# Patient Record
Sex: Female | Born: 1950 | Race: White | Hispanic: No | Marital: Married | Smoking: Never smoker
Health system: Southern US, Community
[De-identification: ages and names within clinical notes are randomized; demographics above are authoritative.]

## PROBLEM LIST (undated history)

## (undated) DIAGNOSIS — E079 Disorder of thyroid, unspecified: Secondary | ICD-10-CM

## (undated) DIAGNOSIS — N189 Chronic kidney disease, unspecified: Secondary | ICD-10-CM

## (undated) DIAGNOSIS — E119 Type 2 diabetes mellitus without complications: Secondary | ICD-10-CM

## (undated) DIAGNOSIS — M810 Age-related osteoporosis without current pathological fracture: Secondary | ICD-10-CM

## (undated) DIAGNOSIS — M8580 Other specified disorders of bone density and structure, unspecified site: Secondary | ICD-10-CM

## (undated) DIAGNOSIS — I10 Essential (primary) hypertension: Secondary | ICD-10-CM

## (undated) DIAGNOSIS — E039 Hypothyroidism, unspecified: Secondary | ICD-10-CM

## (undated) DIAGNOSIS — E785 Hyperlipidemia, unspecified: Secondary | ICD-10-CM

## (undated) HISTORY — DX: Chronic kidney disease, unspecified: N18.9

## (undated) HISTORY — DX: Essential (primary) hypertension: I10

## (undated) HISTORY — DX: Type 2 diabetes mellitus without complications: E11.9

## (undated) HISTORY — DX: Other specified disorders of bone density and structure, unspecified site: M85.80

## (undated) HISTORY — DX: Disorder of thyroid, unspecified: E07.9

## (undated) HISTORY — DX: Hyperlipidemia, unspecified: E78.5

## (undated) HISTORY — DX: Age-related osteoporosis without current pathological fracture: M81.0

---

## 2006-02-16 ENCOUNTER — Ambulatory Visit: Payer: Self-pay | Admitting: Gastroenterology

## 2006-02-16 LAB — HM COLONOSCOPY

## 2009-10-11 ENCOUNTER — Ambulatory Visit: Payer: Self-pay | Admitting: Internal Medicine

## 2009-10-22 ENCOUNTER — Ambulatory Visit: Payer: Self-pay | Admitting: Internal Medicine

## 2009-11-11 ENCOUNTER — Ambulatory Visit: Payer: Self-pay | Admitting: Internal Medicine

## 2009-12-11 ENCOUNTER — Ambulatory Visit: Payer: Self-pay | Admitting: Internal Medicine

## 2010-01-11 ENCOUNTER — Ambulatory Visit: Payer: Self-pay | Admitting: Internal Medicine

## 2010-02-11 ENCOUNTER — Ambulatory Visit: Payer: Self-pay | Admitting: Internal Medicine

## 2010-02-24 ENCOUNTER — Ambulatory Visit: Payer: Self-pay | Admitting: Internal Medicine

## 2010-03-12 ENCOUNTER — Ambulatory Visit: Payer: Self-pay | Admitting: Internal Medicine

## 2010-06-23 ENCOUNTER — Ambulatory Visit: Payer: Self-pay | Admitting: Internal Medicine

## 2010-07-12 ENCOUNTER — Ambulatory Visit: Payer: Self-pay | Admitting: Internal Medicine

## 2010-10-20 ENCOUNTER — Ambulatory Visit: Payer: Self-pay | Admitting: Internal Medicine

## 2010-11-12 ENCOUNTER — Ambulatory Visit: Payer: Self-pay | Admitting: Internal Medicine

## 2011-02-23 ENCOUNTER — Ambulatory Visit: Payer: Self-pay | Admitting: Internal Medicine

## 2011-02-23 LAB — CBC CANCER CENTER
Basophil %: 0.5 %
Eosinophil #: 0.1 x10 3/mm (ref 0.0–0.7)
Eosinophil %: 1.7 %
HCT: 35.2 % (ref 35.0–47.0)
HGB: 12.2 g/dL (ref 12.0–16.0)
Lymphocyte #: 1.5 x10 3/mm (ref 1.0–3.6)
MCH: 34.6 pg — ABNORMAL HIGH (ref 26.0–34.0)
MCHC: 34.6 g/dL (ref 32.0–36.0)
MCV: 100 fL (ref 80–100)
Monocyte #: 0.5 x10 3/mm (ref 0.0–0.7)
Neutrophil #: 3 x10 3/mm (ref 1.4–6.5)
Neutrophil %: 59.2 %
RBC: 3.52 10*6/uL — ABNORMAL LOW (ref 3.80–5.20)
WBC: 5.2 x10 3/mm (ref 3.6–11.0)

## 2011-02-23 LAB — IRON AND TIBC
Iron Saturation: 17 %
Iron: 80 ug/dL (ref 50–170)

## 2011-02-23 LAB — CREATININE, SERUM
Creatinine: 0.88 mg/dL (ref 0.60–1.30)
EGFR (African American): >60
EGFR (Non-African Amer.): >60

## 2011-02-23 LAB — RETICULOCYTES
Absolute Retic Count: 0.048 10*6/uL (ref 0.024–0.084)
Reticulocyte: 1.9 % — ABNORMAL HIGH (ref 0.5–1.5)

## 2011-03-12 ENCOUNTER — Ambulatory Visit: Payer: Self-pay | Admitting: Internal Medicine

## 2011-05-04 ENCOUNTER — Ambulatory Visit: Payer: Self-pay | Admitting: Rheumatology

## 2011-11-12 ENCOUNTER — Ambulatory Visit: Payer: Self-pay

## 2012-10-17 LAB — HM PAP SMEAR

## 2012-11-21 ENCOUNTER — Ambulatory Visit: Payer: Self-pay

## 2013-11-15 ENCOUNTER — Ambulatory Visit: Payer: Self-pay

## 2013-11-22 ENCOUNTER — Ambulatory Visit: Payer: Self-pay

## 2013-11-22 LAB — HM MAMMOGRAPHY

## 2014-10-30 ENCOUNTER — Encounter: Payer: Self-pay | Admitting: Unknown Physician Specialty

## 2014-11-15 DIAGNOSIS — M8580 Other specified disorders of bone density and structure, unspecified site: Secondary | ICD-10-CM

## 2014-11-15 DIAGNOSIS — I10 Essential (primary) hypertension: Secondary | ICD-10-CM

## 2014-11-15 DIAGNOSIS — E1159 Type 2 diabetes mellitus with other circulatory complications: Secondary | ICD-10-CM | POA: Insufficient documentation

## 2014-11-15 DIAGNOSIS — N181 Chronic kidney disease, stage 1: Secondary | ICD-10-CM | POA: Insufficient documentation

## 2014-11-15 DIAGNOSIS — M810 Age-related osteoporosis without current pathological fracture: Secondary | ICD-10-CM | POA: Insufficient documentation

## 2014-11-15 DIAGNOSIS — E1129 Type 2 diabetes mellitus with other diabetic kidney complication: Secondary | ICD-10-CM | POA: Insufficient documentation

## 2014-11-15 DIAGNOSIS — E039 Hypothyroidism, unspecified: Secondary | ICD-10-CM

## 2014-11-15 DIAGNOSIS — I129 Hypertensive chronic kidney disease with stage 1 through stage 4 chronic kidney disease, or unspecified chronic kidney disease: Secondary | ICD-10-CM

## 2014-11-15 DIAGNOSIS — E785 Hyperlipidemia, unspecified: Secondary | ICD-10-CM

## 2014-11-15 DIAGNOSIS — E119 Type 2 diabetes mellitus without complications: Secondary | ICD-10-CM | POA: Insufficient documentation

## 2014-11-15 DIAGNOSIS — E1122 Type 2 diabetes mellitus with diabetic chronic kidney disease: Secondary | ICD-10-CM

## 2014-11-18 ENCOUNTER — Ambulatory Visit (INDEPENDENT_AMBULATORY_CARE_PROVIDER_SITE_OTHER): Payer: BLUE CROSS/BLUE SHIELD | Admitting: Unknown Physician Specialty

## 2014-11-18 ENCOUNTER — Encounter: Payer: Self-pay | Admitting: Unknown Physician Specialty

## 2014-11-18 VITALS — BP 126/84 | HR 70 | Temp 98.5°F | Ht 62.0 in | Wt 162.8 lb

## 2014-11-18 DIAGNOSIS — E785 Hyperlipidemia, unspecified: Secondary | ICD-10-CM

## 2014-11-18 DIAGNOSIS — E039 Hypothyroidism, unspecified: Secondary | ICD-10-CM | POA: Diagnosis not present

## 2014-11-18 DIAGNOSIS — N181 Chronic kidney disease, stage 1: Secondary | ICD-10-CM | POA: Diagnosis not present

## 2014-11-18 DIAGNOSIS — E1122 Type 2 diabetes mellitus with diabetic chronic kidney disease: Secondary | ICD-10-CM | POA: Diagnosis not present

## 2014-11-18 DIAGNOSIS — Z23 Encounter for immunization: Secondary | ICD-10-CM

## 2014-11-18 DIAGNOSIS — Z862 Personal history of diseases of the blood and blood-forming organs and certain disorders involving the immune mechanism: Secondary | ICD-10-CM

## 2014-11-18 DIAGNOSIS — I10 Essential (primary) hypertension: Secondary | ICD-10-CM | POA: Diagnosis not present

## 2014-11-18 DIAGNOSIS — Z0000 Encounter for general adult medical examination without abnormal findings: Secondary | ICD-10-CM

## 2014-11-18 LAB — LIPID PANEL PICCOLO, WAIVED
Chol/HDL Ratio Piccolo,Waive: 2.9 mg/dL
Cholesterol Piccolo, Waived: 146 mg/dL (ref <200)
HDL Chol Piccolo, Waived: 51 mg/dL — ABNORMAL LOW (ref >59)
LDL Chol Calc Piccolo Waived: 71 mg/dL (ref <100)
TRIGLYCERIDES PICCOLO,WAIVED: 123 mg/dL (ref <150)
VLDL Chol Calc Piccolo,Waive: 25 mg/dL (ref <30)

## 2014-11-18 LAB — BAYER DCA HB A1C WAIVED: HB A1C: 6.7 % (ref <7.0)

## 2014-11-18 MED ORDER — QUINAPRIL HCL 10 MG PO TABS: 10.0000 mg | ORAL_TABLET | Freq: Every day | ORAL | Status: DC

## 2014-11-18 MED ORDER — INSULIN PEN NEEDLE 32G X 6 MM MISC: 1.0000 [IU] | Freq: Every day | Status: DC

## 2014-11-18 MED ORDER — PIOGLITAZONE HCL 30 MG PO TABS: 30.0000 mg | ORAL_TABLET | Freq: Every day | ORAL | Status: DC

## 2014-11-18 MED ORDER — METFORMIN HCL ER 500 MG PO TB24: 2000.0000 mg | ORAL_TABLET | Freq: Every day | ORAL | Status: DC

## 2014-11-18 MED ORDER — PRAVASTATIN SODIUM 40 MG PO TABS: 40.0000 mg | ORAL_TABLET | Freq: Every day | ORAL | Status: DC

## 2014-11-18 MED ORDER — LEVOTHYROXINE SODIUM 75 MCG PO TABS: 75.0000 ug | ORAL_TABLET | Freq: Every day | ORAL | Status: DC

## 2014-11-18 MED ORDER — LIRAGLUTIDE 18 MG/3ML ~~LOC~~ SOPN: 1.8000 mg | PEN_INJECTOR | Freq: Every day | SUBCUTANEOUS | Status: DC

## 2014-11-18 MED ORDER — GLYBURIDE 5 MG PO TABS: 5.0000 mg | ORAL_TABLET | Freq: Two times a day (BID) | ORAL | Status: DC

## 2014-11-18 MED ORDER — RALOXIFENE HCL 60 MG PO TABS: 60.0000 mg | ORAL_TABLET | Freq: Every day | ORAL | Status: DC

## 2014-11-18 NOTE — Assessment & Plan Note (Signed)
Stable, continue present medications.   

## 2014-11-18 NOTE — Progress Notes (Signed)
BP 126/84 mmHg  Pulse 70  Temp(Src) 98.5 F (36.9 C)  Ht 5\' 2"  (1.575 m)  Wt 162 lb 12.8 oz (73.846 kg)  BMI 29.77 kg/m2  SpO2 100%  LMP  (LMP Unknown)   Subjective:    Patient ID: Emma Berry, female    DOB: Dec 10, 1950, 64 y.o.   MRN: [id number]  HPI: Emma PeakSandra Faye Anacker is a 64 y.o. female  Chief Complaint  Patient presents with  . Annual Exam   Relevant past medical, surgical, family and social history reviewed and updated as indicated. Interim medical history since our last visit reviewed. Allergies and medications reviewed and updated.  See functional status, depression screen, and fall's risk assessment  under the appropriate section.    Pt is able to perform complex mental tasks, recognize clock face, recognize time and do a 3 item recall.      Depression screen PHQ 2/9 11/18/2014  Decreased Interest 0  Down, Depressed, Hopeless 0  PHQ - 2 Score 0    Fall Risk  11/18/2014  Falls in the past year? No    Diabetes:  Using medications without difficulties Hypoglycemic episodes:no Hyperglycemic episodes: no Feet problems:none Blood Sugars averaging: 100s in the AM eye exam within last year: Yes, letter sent to opthamology  Hypertension Using medications without difficulty Average home BPs 120/70's  Using medication without problems or lightheadedness No chest pain with exertion or shortness of breath Edema: none  Elevated Cholesterol: Using medications without problems Muscle aches: right thigh Diet compliance: good Exercise:good     Relevant past medical, surgical, family and social history reviewed and updated as indicated. Interim medical history since our last visit reviewed. Allergies and medications reviewed and updated.  Review of Systems  Constitutional: Negative.   HENT: Negative.   Eyes: Negative.   Respiratory: Negative.   Cardiovascular: Negative.   Gastrointestinal: Negative.   Endocrine: Negative.   Genitourinary: Negative.    Musculoskeletal: Negative.   Skin: Negative.   Allergic/Immunologic: Negative.   Neurological: Negative.   Hematological: Negative.   Psychiatric/Behavioral: Negative.     Per HPI unless specifically indicated above     Objective:    BP 126/84 mmHg  Pulse 70  Temp(Src) 98.5 F (36.9 C)  Ht 5\' 2"  (1.575 m)  Wt 162 lb 12.8 oz (73.846 kg)  BMI 29.77 kg/m2  SpO2 100%  LMP  (LMP Unknown)  Wt Readings from Last 3 Encounters:  11/18/14 162 lb 12.8 oz (73.846 kg)  04/30/14 164 lb (74.39 kg)    Physical Exam  Constitutional: She is oriented to person, place, and time. She appears well-developed and well-nourished.  HENT:  Head: Normocephalic and atraumatic.  Eyes: Pupils are equal, round, and reactive to light. Right eye exhibits no discharge. Left eye exhibits no discharge. No scleral icterus.  Neck: Normal range of motion. Neck supple. Carotid bruit is not present. No thyromegaly present.  Cardiovascular: Normal rate, regular rhythm and normal heart sounds.  Exam reveals no gallop and no friction rub.   No murmur heard. Pulmonary/Chest: Effort normal and breath sounds normal. No respiratory distress. She has no wheezes. She has no rales.  Abdominal: Soft. Bowel sounds are normal. There is no tenderness. There is no rebound.  Genitourinary: No breast swelling, tenderness or discharge.  Musculoskeletal: Normal range of motion.  Lymphadenopathy:    She has no cervical adenopathy.  Neurological: She is alert and oriented to person, place, and time.  Skin: Skin is warm, dry and intact. No  rash noted.  Psychiatric: She has a normal mood and affect. Her speech is normal and behavior is normal. Judgment and thought content normal. Cognition and memory are normal.      Assessment & Plan:   Problem List Items Addressed This Visit      Unprioritized   Diabetes (HCC) - Primary    Hgb A1C is 6.7.  Continue present medications.        Relevant Medications   glyBURIDE (DIABETA) 5  MG tablet   Liraglutide (VICTOZA) 18 MG/3ML SOPN   metFORMIN (GLUCOPHAGE-XR) 500 MG 24 hr tablet   pioglitazone (ACTOS) 30 MG tablet   pravastatin (PRAVACHOL) 40 MG tablet   quinapril (ACCUPRIL) 10 MG tablet   Other Relevant Orders   Bayer DCA Hb A1c Waived   Hypertension    Stable, continue present medications.       Relevant Medications   pravastatin (PRAVACHOL) 40 MG tablet   quinapril (ACCUPRIL) 10 MG tablet   Other Relevant Orders   Bayer DCA Hb A1c Waived   Comprehensive metabolic panel   Hyperlipidemia    Reviewed lipid panel.  LDL was less than 100.  Continue present medications.         Relevant Medications   pravastatin (PRAVACHOL) 40 MG tablet   quinapril (ACCUPRIL) 10 MG tablet   Other Relevant Orders   Lipid Panel Piccolo, Waived   Hypothyroidism   Relevant Medications   levothyroxine (SYNTHROID, LEVOTHROID) 75 MCG tablet   Other Relevant Orders   TSH   CKD (chronic kidney disease), stage I    Other Visit Diagnoses    Routine general medical examination at a health care facility        Relevant Orders    Hepatitis C antibody    DG Bone Density    History of anemia        Relevant Orders    CBC with Differential/Platelet    Immunization due        Relevant Orders    Flu Vaccine QUAD 36+ mos IM (Completed)    Pneumococcal conjugate vaccine 13-valent IM (Completed)    Tdap vaccine greater than or equal to 7yo IM (Completed)        Follow up plan: Return in about 6 months (around 05/18/2015).-

## 2014-11-18 NOTE — Assessment & Plan Note (Signed)
Reviewed lipid panel.  LDL was less than 100.  Continue present medications.

## 2014-11-18 NOTE — Assessment & Plan Note (Signed)
Hgb A1C is 6.7.  Continue present medications 

## 2014-11-19 LAB — COMPREHENSIVE METABOLIC PANEL
A/G RATIO: 1.4 (ref 1.1–2.5)
ALBUMIN: 4.2 g/dL (ref 3.6–4.8)
ALK PHOS: 75 IU/L (ref 39–117)
ALT: 16 IU/L (ref 0–32)
AST: 37 IU/L (ref 0–40)
BUN / CREAT RATIO: 16 (ref 11–26)
BUN: 15 mg/dL (ref 8–27)
Bilirubin Total: 0.3 mg/dL (ref 0.0–1.2)
CO2: 23 mmol/L (ref 18–29)
CREATININE: 0.93 mg/dL (ref 0.57–1.00)
Calcium: 9.7 mg/dL (ref 8.7–10.3)
Chloride: 100 mmol/L (ref 97–106)
GFR calc Af Amer: 74 mL/min/{1.73_m2} (ref >59)
GFR, EST NON AFRICAN AMERICAN: 64 mL/min/{1.73_m2} (ref >59)
GLOBULIN, TOTAL: 3 g/dL (ref 1.5–4.5)
Glucose: 87 mg/dL (ref 65–99)
POTASSIUM: 5.8 mmol/L — AB (ref 3.5–5.2)
SODIUM: 140 mmol/L (ref 136–144)
Total Protein: 7.2 g/dL (ref 6.0–8.5)

## 2014-11-19 LAB — TSH: TSH: 1.76 u[IU]/mL (ref 0.450–4.500)

## 2014-11-19 LAB — CBC WITH DIFFERENTIAL/PLATELET
BASOS: 0 %
Basophils Absolute: 0 10*3/uL (ref 0.0–0.2)
EOS (ABSOLUTE): 0.1 10*3/uL (ref 0.0–0.4)
EOS: 1 %
HEMATOCRIT: 38.9 % (ref 34.0–46.6)
HEMOGLOBIN: 13 g/dL (ref 11.1–15.9)
Immature Grans (Abs): 0 10*3/uL (ref 0.0–0.1)
Immature Granulocytes: 1 %
LYMPHS ABS: 1.3 10*3/uL (ref 0.7–3.1)
Lymphs: 23 %
MCH: 33.1 pg — ABNORMAL HIGH (ref 26.6–33.0)
MCHC: 33.4 g/dL (ref 31.5–35.7)
MCV: 99 fL — AB (ref 79–97)
MONOS ABS: 0.5 10*3/uL (ref 0.1–0.9)
Monocytes: 9 %
NEUTROS ABS: 3.8 10*3/uL (ref 1.4–7.0)
Neutrophils: 66 %
Platelets: 283 10*3/uL (ref 150–379)
RBC: 3.93 x10E6/uL (ref 3.77–5.28)
RDW: 13.5 % (ref 12.3–15.4)
WBC: 5.8 10*3/uL (ref 3.4–10.8)

## 2014-11-19 LAB — HEPATITIS C ANTIBODY: Hep C Virus Ab: <0.1 s/co ratio (ref 0.0–0.9)

## 2015-02-03 ENCOUNTER — Other Ambulatory Visit: Payer: Self-pay | Admitting: Unknown Physician Specialty

## 2015-02-21 ENCOUNTER — Other Ambulatory Visit: Payer: Self-pay | Admitting: *Deleted

## 2015-03-24 ENCOUNTER — Other Ambulatory Visit: Payer: Self-pay | Admitting: Unknown Physician Specialty

## 2015-03-31 LAB — HM DIABETES EYE EXAM

## 2015-04-29 ENCOUNTER — Other Ambulatory Visit: Payer: Self-pay | Admitting: Family Medicine

## 2015-04-29 NOTE — Telephone Encounter (Signed)
Your patient 

## 2015-05-19 ENCOUNTER — Encounter: Payer: Self-pay | Admitting: Unknown Physician Specialty

## 2015-05-19 ENCOUNTER — Ambulatory Visit (INDEPENDENT_AMBULATORY_CARE_PROVIDER_SITE_OTHER): Payer: BLUE CROSS/BLUE SHIELD | Admitting: Unknown Physician Specialty

## 2015-05-19 VITALS — BP 131/75 | HR 64 | Temp 98.4°F | Ht 61.6 in | Wt 164.4 lb

## 2015-05-19 DIAGNOSIS — E1122 Type 2 diabetes mellitus with diabetic chronic kidney disease: Secondary | ICD-10-CM

## 2015-05-19 DIAGNOSIS — E1143 Type 2 diabetes mellitus with diabetic autonomic (poly)neuropathy: Secondary | ICD-10-CM | POA: Diagnosis not present

## 2015-05-19 DIAGNOSIS — E785 Hyperlipidemia, unspecified: Secondary | ICD-10-CM | POA: Diagnosis not present

## 2015-05-19 DIAGNOSIS — I10 Essential (primary) hypertension: Secondary | ICD-10-CM | POA: Diagnosis not present

## 2015-05-19 DIAGNOSIS — N181 Chronic kidney disease, stage 1: Secondary | ICD-10-CM | POA: Diagnosis not present

## 2015-05-19 LAB — MICROALBUMIN, URINE WAIVED
CREATININE, URINE WAIVED: 50 mg/dL (ref 10–300)
Microalb, Ur Waived: 10 mg/L (ref 0–19)

## 2015-05-19 LAB — LIPID PANEL PICCOLO, WAIVED
Chol/HDL Ratio Piccolo,Waive: 2.6 mg/dL
Cholesterol Piccolo, Waived: 129 mg/dL (ref <200)
HDL CHOL PICCOLO, WAIVED: 50 mg/dL — AB (ref >59)
LDL Chol Calc Piccolo Waived: 64 mg/dL (ref <100)
Triglycerides Piccolo,Waived: 74 mg/dL (ref <150)
VLDL Chol Calc Piccolo,Waive: 15 mg/dL (ref <30)

## 2015-05-19 LAB — BAYER DCA HB A1C WAIVED: HB A1C (BAYER DCA - WAIVED): 7 % — ABNORMAL HIGH (ref <7.0)

## 2015-05-19 MED ORDER — GLYBURIDE 5 MG PO TABS: 5.0000 mg | ORAL_TABLET | Freq: Two times a day (BID) | ORAL | Status: DC

## 2015-05-19 MED ORDER — GABAPENTIN 300 MG PO CAPS: ORAL_CAPSULE | ORAL | Status: DC

## 2015-05-19 MED ORDER — PIOGLITAZONE HCL 30 MG PO TABS: 30.0000 mg | ORAL_TABLET | Freq: Every day | ORAL | Status: DC

## 2015-05-19 MED ORDER — METFORMIN HCL ER 500 MG PO TB24: 2000.0000 mg | ORAL_TABLET | Freq: Every day | ORAL | Status: DC

## 2015-05-19 MED ORDER — QUINAPRIL HCL 10 MG PO TABS: 10.0000 mg | ORAL_TABLET | Freq: Every day | ORAL | Status: DC

## 2015-05-19 MED ORDER — LIRAGLUTIDE 18 MG/3ML ~~LOC~~ SOPN: 1.8000 mg | PEN_INJECTOR | Freq: Every morning | SUBCUTANEOUS | Status: DC

## 2015-05-19 MED ORDER — PRAVASTATIN SODIUM 40 MG PO TABS: 40.0000 mg | ORAL_TABLET | Freq: Every day | ORAL | Status: DC

## 2015-05-19 NOTE — Assessment & Plan Note (Addendum)
Hgb A1C is 7.0.  She plans to increase exercise.  Microalbumin is 10 today

## 2015-05-19 NOTE — Progress Notes (Signed)
+,,,,,,,,,,,,,,,,,,,,,,,,+   BP 131/75 mmHg  Pulse 64  Temp(Src) 98.4 F (36.9 C)  Ht 5' 1.6" (1.565 m)  Wt 164 lb 6.4 oz (74.571 kg)  BMI 30.45 kg/m2  SpO2 98%  LMP  (LMP Unknown)   Subjective:    Patient ID: Emma Berry, female    DOB: [birth year], 65 y.o.   MRN: [id number]  HPI: Emma Berry is a 65 y.o. female  Chief Complaint  Patient presents with  . Hypertension  . Hyperlipidemia  . Diabetes  . Medication Refill    pt states she needs refill on gabapentin   Diabetes:  Using medications without difficulties No hypoglycemic episodes No hyperglycemic episodes Feet problems: Takes Gabapentin and needs a refill   Hypertension:  Using medications without difficulty  Using medication without problems or lightheadedness No chest pain with exertion or shortness of breath No Edema  Elevated Cholesterol: Using medications without problems No Muscle aches Diet compliance: Excellent diet Exercise: walks regularly   Relevant past medical, surgical, family and social history reviewed and updated as indicated. Interim medical history since our last visit reviewed. Allergies and medications reviewed and updated.  Review of Systems  Per HPI unless specifically indicated above     Objective:    BP 131/75 mmHg  Pulse 64  Temp(Src) 98.4 F (36.9 C)  Ht 5' 1.6" (1.565 m)  Wt 164 lb 6.4 oz (74.571 kg)  BMI 30.45 kg/m2  SpO2 98%  LMP  (LMP Unknown)  Wt Readings from Last 3 Encounters:  05/19/15 164 lb 6.4 oz (74.571 kg)  11/18/14 162 lb 12.8 oz (73.846 kg)  04/30/14 164 lb (74.39 kg)    Physical Exam  Constitutional: She is oriented to person, place, and time. She appears well-developed and well-nourished. No distress.  HENT:  Head: Normocephalic and atraumatic.  Eyes: Conjunctivae and lids are normal. Right eye exhibits no discharge. Left eye exhibits no discharge. No scleral icterus.  Neck: Normal range of motion. Neck supple. No JVD present.  Carotid bruit is not present.  Cardiovascular: Normal rate, regular rhythm and normal heart sounds.   Pulmonary/Chest: Effort normal and breath sounds normal.  Abdominal: Normal appearance. There is no splenomegaly or hepatomegaly.  Musculoskeletal: Normal range of motion.  Neurological: She is alert and oriented to person, place, and time.  Skin: Skin is warm, dry and intact. No rash noted. No pallor.  Psychiatric: She has a normal mood and affect. Her behavior is normal. Judgment and thought content normal.   Diabetic Foot Exam - Simple   Simple Foot Form  Diabetic Foot exam was performed with the following findings:  Yes 05/19/2015  9:40 AM  Visual Inspection  No deformities, no ulcerations, no other skin breakdown bilaterally:  Yes  Sensation Testing  Intact to touch and monofilament testing bilaterally:  Yes  Pulse Check  Posterior Tibialis and Dorsalis pulse intact bilaterally:  Yes  Comments        Assessment & Plan:   Problem List Items Addressed This Visit      Unprioritized   Diabetes (HCC) - Primary    Hgb A1C is 7.0.  She plans to increase exercise.  Microalbumin is 10 today      Relevant Medications   Liraglutide (VICTOZA) 18 MG/3ML SOPN   pravastatin (PRAVACHOL) 40 MG tablet   pioglitazone (ACTOS) 30 MG tablet   metFORMIN (GLUCOPHAGE-XR) 500 MG 24 hr tablet   glyBURIDE (DIABETA) 5 MG tablet   quinapril (ACCUPRIL) 10 MG tablet  Other Relevant Orders   Comprehensive metabolic panel   Bayer DCA Hb Z6XA1c Waived   Hyperlipidemia   Relevant Medications   pravastatin (PRAVACHOL) 40 MG tablet   quinapril (ACCUPRIL) 10 MG tablet   Other Relevant Orders   Lipid Panel Piccolo, Waived   Hypertension    Stable, continue present medications.        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

## 2015-05-19 NOTE — Assessment & Plan Note (Signed)
Stable, continue present medications.   

## 2015-05-20 LAB — COMPREHENSIVE METABOLIC PANEL
ALT: 13 IU/L (ref 0–32)
AST: 18 IU/L (ref 0–40)
Albumin/Globulin Ratio: 1.5 (ref 1.2–2.2)
Albumin: 4.1 g/dL (ref 3.6–4.8)
Alkaline Phosphatase: 68 IU/L (ref 39–117)
BUN/Creatinine Ratio: 21 (ref 12–28)
BUN: 17 mg/dL (ref 8–27)
Bilirubin Total: 0.3 mg/dL (ref 0.0–1.2)
CALCIUM: 9.7 mg/dL (ref 8.7–10.3)
CO2: 25 mmol/L (ref 18–29)
CREATININE: 0.81 mg/dL (ref 0.57–1.00)
Chloride: 101 mmol/L (ref 96–106)
GFR calc Af Amer: 87 mL/min/{1.73_m2} (ref >59)
GFR, EST NON AFRICAN AMERICAN: 75 mL/min/{1.73_m2} (ref >59)
Globulin, Total: 2.7 g/dL (ref 1.5–4.5)
Glucose: 120 mg/dL — ABNORMAL HIGH (ref 65–99)
Potassium: 4.6 mmol/L (ref 3.5–5.2)
Sodium: 141 mmol/L (ref 134–144)
Total Protein: 6.8 g/dL (ref 6.0–8.5)

## 2015-05-20 LAB — LIPID PANEL PICCOLO, WAIVED

## 2015-06-25 ENCOUNTER — Ambulatory Visit
Admission: RE | Admit: 2015-06-25 | Discharge: 2015-06-25 | Disposition: A | Discharge status: Home / Self Care | Payer: BLUE CROSS/BLUE SHIELD | Source: Ambulatory Visit | Attending: Unknown Physician Specialty | Admitting: Unknown Physician Specialty

## 2015-06-25 DIAGNOSIS — M85832 Other specified disorders of bone density and structure, left forearm: Secondary | ICD-10-CM | POA: Insufficient documentation

## 2015-06-25 DIAGNOSIS — Z13820 Encounter for screening for osteoporosis: Secondary | ICD-10-CM | POA: Insufficient documentation

## 2015-06-25 DIAGNOSIS — M85851 Other specified disorders of bone density and structure, right thigh: Secondary | ICD-10-CM | POA: Insufficient documentation

## 2015-06-25 DIAGNOSIS — Z0000 Encounter for general adult medical examination without abnormal findings: Secondary | ICD-10-CM

## 2015-08-25 ENCOUNTER — Encounter: Payer: Self-pay | Admitting: Unknown Physician Specialty

## 2015-08-25 ENCOUNTER — Ambulatory Visit (INDEPENDENT_AMBULATORY_CARE_PROVIDER_SITE_OTHER): Payer: BLUE CROSS/BLUE SHIELD | Admitting: Unknown Physician Specialty

## 2015-08-25 VITALS — BP 122/72 | HR 63 | Temp 97.9°F | Ht 62.5 in | Wt 163.8 lb

## 2015-08-25 DIAGNOSIS — I10 Essential (primary) hypertension: Secondary | ICD-10-CM

## 2015-08-25 DIAGNOSIS — N181 Chronic kidney disease, stage 1: Secondary | ICD-10-CM | POA: Diagnosis not present

## 2015-08-25 DIAGNOSIS — H6091 Unspecified otitis externa, right ear: Secondary | ICD-10-CM

## 2015-08-25 DIAGNOSIS — E1122 Type 2 diabetes mellitus with diabetic chronic kidney disease: Secondary | ICD-10-CM

## 2015-08-25 DIAGNOSIS — E785 Hyperlipidemia, unspecified: Secondary | ICD-10-CM | POA: Diagnosis not present

## 2015-08-25 LAB — BAYER DCA HB A1C WAIVED: HB A1C: 7.2 % — AB (ref <7.0)

## 2015-08-25 LAB — HEMOGLOBIN A1C: HEMOGLOBIN A1C: 7.2

## 2015-08-25 MED ORDER — NEOMYCIN-POLYMYXIN-HC 3.5-10000-1 OT SOLN: 4.0000 [drp] | Freq: Four times a day (QID) | OTIC | 0 refills | Status: DC

## 2015-08-25 NOTE — Progress Notes (Signed)
BP 122/72 (BP Location: Left Arm, Patient Position: Sitting, Cuff Size: Large)   Pulse 63   Temp 97.9 F (36.6 C)   Ht 5' 2.5" (1.588 m)   Wt 163 lb 12.8 oz (74.3 kg)   LMP  (LMP Unknown)   SpO2 99%   BMI 29.48 kg/m    Subjective:    Patient ID: Emma Berry, female    DOB: [birth year], 65 y.o.   MRN: [id number]  HPI: Emma Berry is a 65 y.o. female  Chief Complaint  Patient presents with  . Diabetes  . Hyperlipidemia  . Hypothyroidism  . Ear Drainage    pt states she wants right ear looked at, states it has been itching and draining for about 3 weeks    Diabetes: Using medications without difficulties No hypoglycemic episodes No hyperglycemic episodes Feet problems: Blood Sugars averaging: eye exam within last year Last Hgb A1C:7.0  Hypertension  Using medications without difficulty Average home BPs   Using medication without problems or lightheadedness No chest pain with exertion or shortness of breath No Edema  Elevated Cholesterol Using medications without problems No Muscle aches  Diet:  Exercise:  Rt ear Drainage and itching for 3 weeks.     Relevant past medical, surgical, family and social history reviewed and updated as indicated. Interim medical history since our last visit reviewed. Allergies and medications reviewed and updated.  Review of Systems  Per HPI unless specifically indicated above     Objective:    BP 122/72 (BP Location: Left Arm, Patient Position: Sitting, Cuff Size: Large)   Pulse 63   Temp 97.9 F (36.6 C)   Ht 5' 2.5" (1.588 m)   Wt 163 lb 12.8 oz (74.3 kg)   LMP  (LMP Unknown)   SpO2 99%   BMI 29.48 kg/m   Wt Readings from Last 3 Encounters:  08/25/15 163 lb 12.8 oz (74.3 kg)  05/19/15 164 lb 6.4 oz (74.6 kg)  11/18/14 162 lb 12.8 oz (73.8 kg)    Physical Exam  Constitutional: She is oriented to person, place, and time. She appears well-developed and well-nourished. No distress.  HENT:  Head:  Normocephalic and atraumatic.  Right Ear: There is drainage, swelling and tenderness.  Eyes: Conjunctivae and lids are normal. Right eye exhibits no discharge. Left eye exhibits no discharge. No scleral icterus.  Neck: Normal range of motion. Neck supple. No JVD present. Carotid bruit is not present.  Cardiovascular: Normal rate, regular rhythm and normal heart sounds.   Pulmonary/Chest: Effort normal and breath sounds normal.  Abdominal: Normal appearance. There is no splenomegaly or hepatomegaly.  Musculoskeletal: Normal range of motion.  Neurological: She is alert and oriented to person, place, and time.  Skin: Skin is warm, dry and intact. No rash noted. No pallor.  Psychiatric: She has a normal mood and affect. Her behavior is normal. Judgment and thought content normal.    Results for orders placed or performed in visit on 05/19/15  Microalbumin, Urine Waived  Result Value Ref Range   Microalb, Ur Waived 10 0 - 19 mg/L   Creatinine, Urine Waived 50 10 - 300 mg/dL   Microalb/Creat Ratio 30-300 (H) <30 mg/g  Comprehensive metabolic panel  Result Value Ref Range   Glucose 120 (H) 65 - 99 mg/dL   BUN 17 8 - 27 mg/dL   Creatinine, Ser 4.090.81 0.57 - 1.00 mg/dL   GFR calc non Af Amer 75 >59 mL/min/1.73   GFR calc Af Denyse DagoAmer  87 >59 mL/min/1.73   BUN/Creatinine Ratio 21 12 - 28   Sodium 141 134 - 144 mmol/L   Potassium 4.6 3.5 - 5.2 mmol/L   Chloride 101 96 - 106 mmol/L   CO2 25 18 - 29 mmol/L   Calcium 9.7 8.7 - 10.3 mg/dL   Total Protein 6.8 6.0 - 8.5 g/dL   Albumin 4.1 3.6 - 4.8 g/dL   Globulin, Total 2.7 1.5 - 4.5 g/dL   Albumin/Globulin Ratio 1.5 1.2 - 2.2   Bilirubin Total 0.3 0.0 - 1.2 mg/dL   Alkaline Phosphatase 68 39 - 117 IU/L   AST 18 0 - 40 IU/L   ALT 13 0 - 32 IU/L  Bayer DCA Hb A1c Waived  Result Value Ref Range   Bayer DCA Hb A1c Waived 7.0 (H) <7.0 %  Lipid Panel Piccolo, Waived  Result Value Ref Range   Cholesterol Piccolo, Waived CANCELED mg/dL   HDL Chol  Piccolo, Waived CANCELED    Triglycerides Piccolo,Waived CANCELED   Lipid Panel Piccolo, Waived  Result Value Ref Range   Cholesterol Piccolo, Waived 129 <200 mg/dL   HDL Chol Piccolo, Waived 50 (L) >59 mg/dL   Triglycerides Piccolo,Waived 74 <150 mg/dL   Chol/HDL Ratio Piccolo,Waive 2.6 mg/dL   LDL Chol Calc Piccolo Waived 64 <100 mg/dL   VLDL Chol Calc Piccolo,Waive 15 <30 mg/dL      Assessment & Plan:   Problem List Items Addressed This Visit      Unprioritized   Diabetes (HCC) - Primary    Hgb A1C is 7.2.  Pt will work on lifestyle issues      Relevant Orders   Bayer DCA Hb A1c Waived   Comprehensive metabolic panel   Hyperlipidemia    Stable, continue present medications.        Hypertension    Stable, continue present medications.         Other Visit Diagnoses    Otitis externa, right       Relevant Medications   neomycin-polymyxin-hydrocortisone (CORTISPORIN) otic solution    ------   Follow up plan: Return in about 3 months (around 11/25/2015).

## 2015-08-25 NOTE — Assessment & Plan Note (Signed)
Stable, continue present medications.   

## 2015-08-25 NOTE — Assessment & Plan Note (Signed)
Hgb A1C is 7.2.  Pt will work on lifestyle issues

## 2015-08-26 ENCOUNTER — Encounter: Payer: Self-pay | Admitting: Unknown Physician Specialty

## 2015-08-26 LAB — COMPREHENSIVE METABOLIC PANEL
ALK PHOS: 71 IU/L (ref 39–117)
ALT: 13 IU/L (ref 0–32)
AST: 16 IU/L (ref 0–40)
Albumin/Globulin Ratio: 1.4 (ref 1.2–2.2)
Albumin: 3.9 g/dL (ref 3.6–4.8)
BUN/Creatinine Ratio: 20 (ref 12–28)
BUN: 18 mg/dL (ref 8–27)
Bilirubin Total: 0.4 mg/dL (ref 0.0–1.2)
CO2: 24 mmol/L (ref 18–29)
CREATININE: 0.92 mg/dL (ref 0.57–1.00)
Calcium: 9.4 mg/dL (ref 8.7–10.3)
Chloride: 103 mmol/L (ref 96–106)
GFR calc Af Amer: 75 mL/min/{1.73_m2} (ref >59)
GFR calc non Af Amer: 65 mL/min/{1.73_m2} (ref >59)
GLUCOSE: 93 mg/dL (ref 65–99)
Globulin, Total: 2.7 g/dL (ref 1.5–4.5)
Potassium: 4.6 mmol/L (ref 3.5–5.2)
Sodium: 141 mmol/L (ref 134–144)
Total Protein: 6.6 g/dL (ref 6.0–8.5)

## 2015-09-22 ENCOUNTER — Other Ambulatory Visit: Payer: Self-pay | Admitting: Unknown Physician Specialty

## 2015-11-10 ENCOUNTER — Encounter (INDEPENDENT_AMBULATORY_CARE_PROVIDER_SITE_OTHER): Payer: Self-pay

## 2015-11-19 ENCOUNTER — Encounter: Payer: BLUE CROSS/BLUE SHIELD | Admitting: Unknown Physician Specialty

## 2015-11-24 ENCOUNTER — Ambulatory Visit (INDEPENDENT_AMBULATORY_CARE_PROVIDER_SITE_OTHER): Payer: BLUE CROSS/BLUE SHIELD | Admitting: Unknown Physician Specialty

## 2015-11-24 ENCOUNTER — Encounter: Payer: Self-pay | Admitting: Unknown Physician Specialty

## 2015-11-24 ENCOUNTER — Other Ambulatory Visit: Payer: Self-pay

## 2015-11-24 VITALS — BP 134/76 | HR 71 | Temp 98.3°F | Ht 62.2 in | Wt 163.0 lb

## 2015-11-24 DIAGNOSIS — Z0000 Encounter for general adult medical examination without abnormal findings: Secondary | ICD-10-CM | POA: Diagnosis not present

## 2015-11-24 DIAGNOSIS — E782 Mixed hyperlipidemia: Secondary | ICD-10-CM | POA: Diagnosis not present

## 2015-11-24 DIAGNOSIS — I129 Hypertensive chronic kidney disease with stage 1 through stage 4 chronic kidney disease, or unspecified chronic kidney disease: Secondary | ICD-10-CM

## 2015-11-24 DIAGNOSIS — E1122 Type 2 diabetes mellitus with diabetic chronic kidney disease: Secondary | ICD-10-CM

## 2015-11-24 DIAGNOSIS — Z23 Encounter for immunization: Secondary | ICD-10-CM

## 2015-11-24 DIAGNOSIS — Z1239 Encounter for other screening for malignant neoplasm of breast: Secondary | ICD-10-CM

## 2015-11-24 DIAGNOSIS — N181 Chronic kidney disease, stage 1: Secondary | ICD-10-CM | POA: Diagnosis not present

## 2015-11-24 LAB — BAYER DCA HB A1C WAIVED: HB A1C: 7.2 % — AB (ref <7.0)

## 2015-11-24 LAB — HEMOGLOBIN A1C: Hemoglobin A1C: 7.2

## 2015-11-24 MED ORDER — QUINAPRIL HCL 10 MG PO TABS: 10.0000 mg | ORAL_TABLET | Freq: Every day | ORAL | 1 refills | Status: DC

## 2015-11-24 MED ORDER — LIRAGLUTIDE 18 MG/3ML ~~LOC~~ SOPN: 1.8000 mg | PEN_INJECTOR | Freq: Every morning | SUBCUTANEOUS | 12 refills | Status: DC

## 2015-11-24 MED ORDER — LEVOTHYROXINE SODIUM 75 MCG PO TABS: 75.0000 ug | ORAL_TABLET | Freq: Every day | ORAL | 3 refills | Status: DC

## 2015-11-24 MED ORDER — METFORMIN HCL ER 500 MG PO TB24: 2000.0000 mg | ORAL_TABLET | Freq: Every day | ORAL | 1 refills | Status: DC

## 2015-11-24 MED ORDER — PRAVASTATIN SODIUM 40 MG PO TABS: 40.0000 mg | ORAL_TABLET | Freq: Every day | ORAL | 1 refills | Status: DC

## 2015-11-24 MED ORDER — PIOGLITAZONE HCL 30 MG PO TABS: 30.0000 mg | ORAL_TABLET | Freq: Every day | ORAL | 1 refills | Status: DC

## 2015-11-24 MED ORDER — GLYBURIDE 5 MG PO TABS: 5.0000 mg | ORAL_TABLET | Freq: Two times a day (BID) | ORAL | 1 refills | Status: DC

## 2015-11-24 MED ORDER — RALOXIFENE HCL 60 MG PO TABS: 60.0000 mg | ORAL_TABLET | Freq: Every day | ORAL | 3 refills | Status: DC

## 2015-11-24 NOTE — Patient Instructions (Addendum)

## 2015-11-24 NOTE — Progress Notes (Signed)
BP 134/76 (BP Location: Left Arm, Patient Position: Sitting, Cuff Size: Normal)   Pulse 71   Temp 98.3 F (36.8 C)   Ht 5' 2.2" (1.58 m)   Wt 163 lb (73.9 kg)   LMP  (LMP Unknown)   SpO2 98%   BMI 29.62 kg/m    Subjective:    Patient ID: Emma Berry, female    DOB: 1950/02/24, 65 y.o.   MRN: [id number]  HPI: Emma Berry is a 65 y.o. female  Chief Complaint  Patient presents with  . Annual Exam   Fall Risk  11/24/2015 11/18/2014  Falls in the past year? No No   [facility] 11/24/2015 11/18/2014  Decreased Interest 0 0  Down, Depressed, Hopeless 0 0  PHQ - 2 Score 0 0  Altered sleeping 0 -  Tired, decreased energy 0 -  Change in appetite 0 -  Feeling bad or failure about yourself  0 -  Trouble concentrating 0 -  Moving slowly or fidgety/restless 0 -  Suicidal thoughts 0 -  PHQ-9 Score 0 -    Diabetes: Using medications without difficulties No hypoglycemic episodes No hyperglycemic episodes Feet problems: none Blood Sugars averaging: 990-110 eye exam within last year Last Hgb A1C:7.2  Hypertension  Using medications without difficulty Average home BPs   Using medication without problems or lightheadedness No chest pain with exertion or shortness of breath No Edema  Elevated Cholesterol Using medications without problems No Muscle aches  Diet: "working on it" Exercise:"stiff hips"    Relevant past medical, surgical, family and social history reviewed and updated as indicated. Interim medical history since our last visit reviewed. Allergies and medications reviewed and updated.  Review of Systems  Constitutional: Negative.   HENT: Negative.   Eyes: Negative.   Respiratory: Negative.   Cardiovascular: Negative.   Gastrointestinal: Negative.   Endocrine: Negative.   Genitourinary: Negative.   Musculoskeletal: Negative.   Skin: Negative.   Allergic/Immunologic: Negative.   Neurological: Negative.   Hematological: Negative.    Psychiatric/Behavioral: Negative.     Per HPI unless specifically indicated above     Objective:    BP 134/76 (BP Location: Left Arm, Patient Position: Sitting, Cuff Size: Normal)   Pulse 71   Temp 98.3 F (36.8 C)   Ht 5' 2.2" (1.58 m)   Wt 163 lb (73.9 kg)   LMP  (LMP Unknown)   SpO2 98%   BMI 29.62 kg/m   Wt Readings from Last 3 Encounters:  11/24/15 163 lb (73.9 kg)  08/25/15 163 lb 12.8 oz (74.3 kg)  05/19/15 164 lb 6.4 oz (74.6 kg)    Physical Exam  Constitutional: She is oriented to person, place, and time. She appears well-developed and well-nourished.  HENT:  Head: Normocephalic and atraumatic.  Eyes: Pupils are equal, round, and reactive to light. Right eye exhibits no discharge. Left eye exhibits no discharge. No scleral icterus.  Neck: Normal range of motion. Neck supple. Carotid bruit is not present. No thyromegaly present.  Cardiovascular: Normal rate, regular rhythm and normal heart sounds.  Exam reveals no gallop and no friction rub.   No murmur heard. Pulmonary/Chest: Effort normal and breath sounds normal. No respiratory distress. She has no wheezes. She has no rales.  Abdominal: Soft. Bowel sounds are normal. There is no tenderness. There is no rebound.  Genitourinary: No breast swelling, tenderness or discharge.  Musculoskeletal: Normal range of motion.  Lymphadenopathy:    She has no cervical adenopathy.  Neurological: She is alert and oriented to person, place, and time.  Skin: Skin is warm, dry and intact. No rash noted.  Psychiatric: She has a normal mood and affect. Her speech is normal and behavior is normal. Judgment and thought content normal. Cognition and memory are normal.    Results for orders placed or performed in visit on 08/25/15  Bayer DCA Hb A1c Waived  Result Value Ref Range   Bayer DCA Hb A1c Waived 7.2 (H) <7.0 %  Comprehensive metabolic panel  Result Value Ref Range   Glucose 93 65 - 99 mg/dL   BUN 18 8 - 27 mg/dL    Creatinine, Ser 1.300.92 0.57 - 1.00 mg/dL   GFR calc non Af Amer 65 >59 mL/min/1.73   GFR calc Af Amer 75 >59 mL/min/1.73   BUN/Creatinine Ratio 20 12 - 28   Sodium 141 134 - 144 mmol/L   Potassium 4.6 3.5 - 5.2 mmol/L   Chloride 103 96 - 106 mmol/L   CO2 24 18 - 29 mmol/L   Calcium 9.4 8.7 - 10.3 mg/dL   Total Protein 6.6 6.0 - 8.5 g/dL   Albumin 3.9 3.6 - 4.8 g/dL   Globulin, Total 2.7 1.5 - 4.5 g/dL   Albumin/Globulin Ratio 1.4 1.2 - 2.2   Bilirubin Total 0.4 0.0 - 1.2 mg/dL   Alkaline Phosphatase 71 39 - 117 IU/L   AST 16 0 - 40 IU/L   ALT 13 0 - 32 IU/L  Hemoglobin A1c  Result Value Ref Range   Hemoglobin A1C 7.2       Assessment & Plan:   Problem List Items Addressed This Visit      Unprioritized   Diabetes (HCC)    Increase Victoza from 1.2 to 1.8      Relevant Orders   Comprehensive metabolic panel   Bayer DCA Hb Q6VA1c Waived   Hyperlipidemia    Await lipid panel      Relevant Orders   Lipid Panel w/o Chol/HDL Ratio   Hypertensive CKD (chronic kidney disease)    Stable, continue present medications.        Relevant Orders   Comprehensive metabolic panel    Other Visit Diagnoses    Need for influenza vaccination    -  Primary   Relevant Orders   Flu vaccine HIGH DOSE PF (Completed)   Encounter for breast cancer screening other than mammogram       Relevant Orders   MM DIGITAL SCREENING BILATERAL   Annual physical exam           Follow up plan: Return in about 3 months (around 02/24/2016).

## 2015-11-24 NOTE — Assessment & Plan Note (Signed)
Increase Victoza from 1.2 to 1.8

## 2015-11-24 NOTE — Assessment & Plan Note (Signed)
Stable, continue present medications.   

## 2015-11-24 NOTE — Assessment & Plan Note (Signed)
Await lipid panel 

## 2015-11-25 ENCOUNTER — Encounter: Payer: Self-pay | Admitting: Unknown Physician Specialty

## 2015-11-25 LAB — COMPREHENSIVE METABOLIC PANEL
ALBUMIN: 3.9 g/dL (ref 3.6–4.8)
ALT: 14 IU/L (ref 0–32)
AST: 15 IU/L (ref 0–40)
Albumin/Globulin Ratio: 1.3 (ref 1.2–2.2)
Alkaline Phosphatase: 79 IU/L (ref 39–117)
BUN / CREAT RATIO: 21 (ref 12–28)
BUN: 20 mg/dL (ref 8–27)
Bilirubin Total: 0.4 mg/dL (ref 0.0–1.2)
CALCIUM: 9.5 mg/dL (ref 8.7–10.3)
CO2: 24 mmol/L (ref 18–29)
CREATININE: 0.95 mg/dL (ref 0.57–1.00)
Chloride: 100 mmol/L (ref 96–106)
GFR, EST AFRICAN AMERICAN: 72 mL/min/{1.73_m2} (ref >59)
GFR, EST NON AFRICAN AMERICAN: 62 mL/min/{1.73_m2} (ref >59)
GLUCOSE: 117 mg/dL — AB (ref 65–99)
Globulin, Total: 2.9 g/dL (ref 1.5–4.5)
Potassium: 4.7 mmol/L (ref 3.5–5.2)
Sodium: 140 mmol/L (ref 134–144)
TOTAL PROTEIN: 6.8 g/dL (ref 6.0–8.5)

## 2015-11-25 LAB — LIPID PANEL W/O CHOL/HDL RATIO
Cholesterol, Total: 125 mg/dL (ref 100–199)
HDL: 46 mg/dL (ref >39)
LDL CALC: 62 mg/dL (ref 0–99)
Triglycerides: 85 mg/dL (ref 0–149)
VLDL CHOLESTEROL CAL: 17 mg/dL (ref 5–40)

## 2016-02-24 ENCOUNTER — Ambulatory Visit (INDEPENDENT_AMBULATORY_CARE_PROVIDER_SITE_OTHER): Payer: BLUE CROSS/BLUE SHIELD | Admitting: Unknown Physician Specialty

## 2016-02-24 ENCOUNTER — Encounter: Payer: Self-pay | Admitting: Unknown Physician Specialty

## 2016-02-24 VITALS — BP 132/81 | HR 67 | Temp 97.9°F | Ht 61.8 in | Wt 161.6 lb

## 2016-02-24 DIAGNOSIS — I10 Essential (primary) hypertension: Secondary | ICD-10-CM

## 2016-02-24 DIAGNOSIS — N181 Chronic kidney disease, stage 1: Secondary | ICD-10-CM

## 2016-02-24 DIAGNOSIS — E1122 Type 2 diabetes mellitus with diabetic chronic kidney disease: Secondary | ICD-10-CM | POA: Diagnosis not present

## 2016-02-24 LAB — BAYER DCA HB A1C WAIVED: HB A1C: 7.2 % — AB (ref <7.0)

## 2016-02-24 NOTE — Progress Notes (Signed)
   BP 132/81 (BP Location: Left Arm, Patient Position: Sitting, Cuff Size: Normal)   Pulse 67   Temp 97.9 F (36.6 C)   Ht 5' 1.8" (1.57 m)   Wt 161 lb 9.6 oz (73.3 kg)   LMP  (LMP Unknown)   SpO2 100%   BMI 29.75 kg/m    Subjective:    Patient ID: Emma Berry, female    DOB: 1950-07-03, 66 y.o.   MRN: [id number]  HPI: Emma Berry is a 66 y.o. female  Chief Complaint  Patient presents with  . Diabetes  . Hyperlipidemia  . Hypertension   Diabetes: Using medications without difficulties.  Last visit increased Victoza to 1.8.   No hypoglycemic episodes No hyperglycemic episodes Blood Sugars averaging: 101-115 eye exam within last year Last Hgb A1C:7.2 Diet/Exercise: No real changes as husband was ill  Hypertension  Using medications without difficulty Average home BPs   Using medication without problems or lightheadedness No chest pain with exertion or shortness of breath No Edema  Relevant past medical, surgical, family and social history reviewed and updated as indicated. Interim medical history since our last visit reviewed. Allergies and medications reviewed and updated.  Review of Systems  Per HPI unless specifically indicated above     Objective:    BP 132/81 (BP Location: Left Arm, Patient Position: Sitting, Cuff Size: Normal)   Pulse 67   Temp 97.9 F (36.6 C)   Ht 5' 1.8" (1.57 m)   Wt 161 lb 9.6 oz (73.3 kg)   LMP  (LMP Unknown)   SpO2 100%   BMI 29.75 kg/m   Wt Readings from Last 3 Encounters:  02/24/16 161 lb 9.6 oz (73.3 kg)  11/24/15 163 lb (73.9 kg)  08/25/15 163 lb 12.8 oz (74.3 kg)    Physical Exam  Constitutional: She is oriented to person, place, and time. She appears well-developed and well-nourished. No distress.  HENT:  Head: Normocephalic and atraumatic.  Eyes: Conjunctivae and lids are normal. Right eye exhibits no discharge. Left eye exhibits no discharge. No scleral icterus.  Neck: Normal range of motion. Neck  supple. No JVD present. Carotid bruit is not present.  Cardiovascular: Normal rate, regular rhythm and normal heart sounds.   Pulmonary/Chest: Effort normal and breath sounds normal.  Abdominal: Normal appearance. There is no splenomegaly or hepatomegaly.  Musculoskeletal: Normal range of motion.  Neurological: She is alert and oriented to person, place, and time.  Skin: Skin is warm, dry and intact. No rash noted. No pallor.  Psychiatric: She has a normal mood and affect. Her behavior is normal. Judgment and thought content normal.      Assessment & Plan:   Problem List Items Addressed This Visit      Unprioritized   Diabetes (HCC) - Primary    Pt with Hgb A1C 7.2.  Suspect worsening DM while husband was ill but judging by today's numbers, probably improving and recheck in 3 months.  Make no changes today and work on lifestyle      Relevant Orders   Bayer DCA Hb A1c Waived   Comprehensive metabolic panel   Hypertension    Stable, continue present medications.        Relevant Orders   Comprehensive metabolic panel       Follow up plan: Return in about 3 months (around 05/23/2016).

## 2016-02-24 NOTE — Assessment & Plan Note (Signed)
Stable, continue present medications.   

## 2016-02-24 NOTE — Assessment & Plan Note (Addendum)
Pt with Hgb A1C 7.2.  Suspect worsening DM while husband was ill but judging by today's numbers, probably improving and recheck in 3 months.  Make no changes today and work on lifestyle

## 2016-02-25 LAB — COMPREHENSIVE METABOLIC PANEL
A/G RATIO: 1.6 (ref 1.2–2.2)
ALK PHOS: 68 IU/L (ref 39–117)
ALT: 12 IU/L (ref 0–32)
AST: 14 IU/L (ref 0–40)
Albumin: 3.9 g/dL (ref 3.6–4.8)
BILIRUBIN TOTAL: 0.3 mg/dL (ref 0.0–1.2)
BUN / CREAT RATIO: 18 (ref 12–28)
BUN: 16 mg/dL (ref 8–27)
CHLORIDE: 102 mmol/L (ref 96–106)
CO2: 23 mmol/L (ref 18–29)
Calcium: 9.3 mg/dL (ref 8.7–10.3)
Creatinine, Ser: 0.88 mg/dL (ref 0.57–1.00)
GFR calc Af Amer: 79 mL/min/{1.73_m2} (ref >59)
GFR calc non Af Amer: 68 mL/min/{1.73_m2} (ref >59)
Globulin, Total: 2.5 g/dL (ref 1.5–4.5)
Glucose: 141 mg/dL — ABNORMAL HIGH (ref 65–99)
POTASSIUM: 4.5 mmol/L (ref 3.5–5.2)
Sodium: 141 mmol/L (ref 134–144)
Total Protein: 6.4 g/dL (ref 6.0–8.5)

## 2016-03-02 ENCOUNTER — Other Ambulatory Visit: Payer: Self-pay | Admitting: Unknown Physician Specialty

## 2016-03-25 ENCOUNTER — Telehealth: Payer: Self-pay | Admitting: Unknown Physician Specialty

## 2016-03-25 MED ORDER — OSELTAMIVIR PHOSPHATE 75 MG PO CAPS: 75.0000 mg | ORAL_CAPSULE | Freq: Two times a day (BID) | ORAL | 0 refills | Status: DC

## 2016-03-25 NOTE — Telephone Encounter (Signed)
Patient called to see if Elnita MaxwellCheryl would call her in something for the flu. She keeps her grandchildren and both of them have been diagnosed with the flu.  She feels sure she has the same thing.   I explained Elnita MaxwellCheryl was not in but I would check with another provider to see if this could be done.  [facility] in [place]  Thanks  [phone]

## 2016-03-25 NOTE — Telephone Encounter (Signed)
Routing to provider  

## 2016-03-25 NOTE — Telephone Encounter (Signed)
Tamiflu sent.

## 2016-03-26 ENCOUNTER — Encounter: Payer: Self-pay | Admitting: Unknown Physician Specialty

## 2016-05-13 ENCOUNTER — Ambulatory Visit
Admission: RE | Admit: 2016-05-13 | Discharge: 2016-05-13 | Disposition: A | Discharge status: Home / Self Care | Payer: BLUE CROSS/BLUE SHIELD | Source: Ambulatory Visit | Attending: Unknown Physician Specialty | Admitting: Unknown Physician Specialty

## 2016-05-13 DIAGNOSIS — Z1239 Encounter for other screening for malignant neoplasm of breast: Secondary | ICD-10-CM

## 2016-05-13 DIAGNOSIS — Z1231 Encounter for screening mammogram for malignant neoplasm of breast: Secondary | ICD-10-CM | POA: Diagnosis present

## 2016-05-25 ENCOUNTER — Encounter: Payer: Self-pay | Admitting: Unknown Physician Specialty

## 2016-05-25 ENCOUNTER — Ambulatory Visit (INDEPENDENT_AMBULATORY_CARE_PROVIDER_SITE_OTHER): Payer: BLUE CROSS/BLUE SHIELD | Admitting: Unknown Physician Specialty

## 2016-05-25 VITALS — BP 115/66 | HR 73 | Temp 97.7°F | Wt 161.6 lb

## 2016-05-25 DIAGNOSIS — I10 Essential (primary) hypertension: Secondary | ICD-10-CM | POA: Diagnosis not present

## 2016-05-25 DIAGNOSIS — E782 Mixed hyperlipidemia: Secondary | ICD-10-CM

## 2016-05-25 DIAGNOSIS — E1122 Type 2 diabetes mellitus with diabetic chronic kidney disease: Secondary | ICD-10-CM

## 2016-05-25 DIAGNOSIS — N181 Chronic kidney disease, stage 1: Secondary | ICD-10-CM

## 2016-05-25 LAB — MICROALBUMIN, URINE WAIVED
Creatinine, Urine Waived: 100 mg/dL (ref 10–300)
MICROALB, UR WAIVED: 10 mg/L (ref 0–19)

## 2016-05-25 LAB — LIPID PANEL PICCOLO, WAIVED
Chol/HDL Ratio Piccolo,Waive: 2.4 mg/dL
Cholesterol Piccolo, Waived: 121 mg/dL (ref <200)
HDL CHOL PICCOLO, WAIVED: 49 mg/dL — AB (ref >59)
LDL Chol Calc Piccolo Waived: 58 mg/dL (ref <100)
TRIGLYCERIDES PICCOLO,WAIVED: 69 mg/dL (ref <150)
VLDL Chol Calc Piccolo,Waive: 14 mg/dL (ref <30)

## 2016-05-25 LAB — BAYER DCA HB A1C WAIVED: HB A1C (BAYER DCA - WAIVED): 6.9 % (ref <7.0)

## 2016-05-25 NOTE — Assessment & Plan Note (Signed)
Stable, continue present medications.   

## 2016-05-25 NOTE — Assessment & Plan Note (Signed)
Hgb A1C 6.9.  Continue present medications

## 2016-05-25 NOTE — Progress Notes (Signed)
BP 115/66   Pulse 73   Temp 97.7 F (36.5 C)   Wt 161 lb 9.6 oz (73.3 kg)   LMP  (LMP Unknown)   SpO2 98%   BMI 29.75 kg/m    Subjective:    Patient ID: Emma Berry, female    DOB: December 14, 1950, 66 y.o.   MRN: [id number]  HPI: Emma Berry is a 66 y.o. female  Chief Complaint  Patient presents with  . Diabetes  . Hyperlipidemia  . Hypertension   Diabetes: Using medications without difficulties.  Taking 1.8 of Victoza No hypoglycemic episodes No hyperglycemic episodes Feet problems:none Blood Sugars averaging: 70-100 eye exam within last year Last Hgb A1C: 7.2  Hypertension  Using medications without difficulty.  She does have a cough but thinks it's related to pollen Average home BPs   Using medication without problems or lightheadedness No chest pain with exertion or shortness of breath No Edema  Elevated Cholesterol Using medications without problems No Muscle aches  Diet:  Exercise:Working in the garden.    Relevant past medical, surgical, family and social history reviewed and updated as indicated. Interim medical history since our last visit reviewed. Allergies and medications reviewed and updated.  Review of Systems  Per HPI unless specifically indicated above     Objective:    BP 115/66   Pulse 73   Temp 97.7 F (36.5 C)   Wt 161 lb 9.6 oz (73.3 kg)   LMP  (LMP Unknown)   SpO2 98%   BMI 29.75 kg/m   Wt Readings from Last 3 Encounters:  05/25/16 161 lb 9.6 oz (73.3 kg)  02/24/16 161 lb 9.6 oz (73.3 kg)  11/24/15 163 lb (73.9 kg)    Physical Exam  Constitutional: She is oriented to person, place, and time. She appears well-developed and well-nourished. No distress.  HENT:  Head: Normocephalic and atraumatic.  Eyes: Conjunctivae and lids are normal. Right eye exhibits no discharge. Left eye exhibits no discharge. No scleral icterus.  Neck: Normal range of motion. Neck supple. No JVD present. Carotid bruit is not present.    Cardiovascular: Normal rate, regular rhythm and normal heart sounds.   Pulmonary/Chest: Effort normal and breath sounds normal.  Abdominal: Normal appearance. There is no splenomegaly or hepatomegaly.  Musculoskeletal: Normal range of motion.  Neurological: She is alert and oriented to person, place, and time.  Skin: Skin is warm, dry and intact. No rash noted. No pallor.  Psychiatric: She has a normal mood and affect. Her behavior is normal. Judgment and thought content normal.    Results for orders placed or performed in visit on 02/24/16  Bayer DCA Hb A1c Waived  Result Value Ref Range   Bayer DCA Hb A1c Waived 7.2 (H) <7.0 %  Comprehensive metabolic panel  Result Value Ref Range   Glucose 141 (H) 65 - 99 mg/dL   BUN 16 8 - 27 mg/dL   Creatinine, Ser 1.47 0.57 - 1.00 mg/dL   GFR calc non Af Amer 68 >59 mL/min/1.73   GFR calc Af Amer 79 >59 mL/min/1.73   BUN/Creatinine Ratio 18 12 - 28   Sodium 141 134 - 144 mmol/L   Potassium 4.5 3.5 - 5.2 mmol/L   Chloride 102 96 - 106 mmol/L   CO2 23 18 - 29 mmol/L   Calcium 9.3 8.7 - 10.3 mg/dL   Total Protein 6.4 6.0 - 8.5 g/dL   Albumin 3.9 3.6 - 4.8 g/dL   Globulin, Total 2.5 1.5 -  4.5 g/dL   Albumin/Globulin Ratio 1.6 1.2 - 2.2   Bilirubin Total 0.3 0.0 - 1.2 mg/dL   Alkaline Phosphatase 68 39 - 117 IU/L   AST 14 0 - 40 IU/L   ALT 12 0 - 32 IU/L      Assessment & Plan:   Problem List Items Addressed This Visit      Unprioritized   Diabetes (HCC) - Primary    Hgb A1C 6.9.  Continue present medications      Relevant Orders   Lipid Panel Piccolo, Waived   Microalbumin, Urine Waived   Comprehensive metabolic panel   Bayer DCA Hb B1YA1c Waived   Hyperlipidemia    Stable, continue present medications.        Hypertension    Stable, continue present medications.            Follow up plan: Return for physical.

## 2016-05-26 LAB — COMPREHENSIVE METABOLIC PANEL
ALBUMIN: 3.9 g/dL (ref 3.6–4.8)
ALK PHOS: 80 IU/L (ref 39–117)
ALT: 12 IU/L (ref 0–32)
AST: 22 IU/L (ref 0–40)
Albumin/Globulin Ratio: 1.3 (ref 1.2–2.2)
BILIRUBIN TOTAL: 0.3 mg/dL (ref 0.0–1.2)
BUN / CREAT RATIO: 19 (ref 12–28)
BUN: 16 mg/dL (ref 8–27)
CHLORIDE: 102 mmol/L (ref 96–106)
CO2: 25 mmol/L (ref 18–29)
Calcium: 9.3 mg/dL (ref 8.7–10.3)
Creatinine, Ser: 0.85 mg/dL (ref 0.57–1.00)
GFR calc Af Amer: 81 mL/min/{1.73_m2} (ref >59)
GFR calc non Af Amer: 71 mL/min/{1.73_m2} (ref >59)
GLUCOSE: 99 mg/dL (ref 65–99)
Globulin, Total: 2.9 g/dL (ref 1.5–4.5)
Potassium: 4.8 mmol/L (ref 3.5–5.2)
Sodium: 140 mmol/L (ref 134–144)
Total Protein: 6.8 g/dL (ref 6.0–8.5)

## 2016-06-03 ENCOUNTER — Other Ambulatory Visit: Payer: Self-pay | Admitting: Unknown Physician Specialty

## 2016-06-08 ENCOUNTER — Other Ambulatory Visit: Payer: Self-pay | Admitting: Unknown Physician Specialty

## 2016-06-08 ENCOUNTER — Ambulatory Visit: Payer: BLUE CROSS/BLUE SHIELD | Admitting: Unknown Physician Specialty

## 2016-07-28 ENCOUNTER — Other Ambulatory Visit: Payer: Self-pay | Admitting: Unknown Physician Specialty

## 2016-08-23 ENCOUNTER — Ambulatory Visit (INDEPENDENT_AMBULATORY_CARE_PROVIDER_SITE_OTHER): Payer: BLUE CROSS/BLUE SHIELD | Admitting: Unknown Physician Specialty

## 2016-08-23 ENCOUNTER — Encounter: Payer: Self-pay | Admitting: Unknown Physician Specialty

## 2016-08-23 DIAGNOSIS — M542 Cervicalgia: Secondary | ICD-10-CM

## 2016-08-23 NOTE — Assessment & Plan Note (Signed)
I don't appreciate a rash.  Will use OTC NSAIDS

## 2016-08-23 NOTE — Progress Notes (Signed)
BP 116/71 (BP Location: Left Arm, Cuff Size: Normal)   Pulse 70   Temp 98.3 F (36.8 C)   Wt 163 lb 3.2 oz (74 kg)   LMP  (LMP Unknown)   SpO2 99%   BMI 30.04 kg/m    Subjective:    Patient ID: Emma Berry, female    DOB: 1950/08/24, 66 y.o.   MRN: [id number]  HPI: Emma Berry is a 66 y.o. female  Chief Complaint  Patient presents with  . Rash    pt states she has a rash that started on her left ear lobe and went to the middle of her neck. She states that it is painful and she noticed some bumps on Friday    Pt states she is having tenderness left occipital area.  Felt some bumps in the area. No trouble with movement   Relevant past medical, surgical, family and social history reviewed and updated as indicated. Interim medical history since our last visit reviewed. Allergies and medications reviewed and updated.  Review of Systems  Per HPI unless specifically indicated above     Objective:    BP 116/71 (BP Location: Left Arm, Cuff Size: Normal)   Pulse 70   Temp 98.3 F (36.8 C)   Wt 163 lb 3.2 oz (74 kg)   LMP  (LMP Unknown)   SpO2 99%   BMI 30.04 kg/m   Wt Readings from Last 3 Encounters:  08/23/16 163 lb 3.2 oz (74 kg)  05/25/16 161 lb 9.6 oz (73.3 kg)  02/24/16 161 lb 9.6 oz (73.3 kg)    Physical Exam  Constitutional: She is oriented to person, place, and time. She appears well-developed and well-nourished. No distress.  HENT:  Head: Normocephalic and atraumatic.  Eyes: Conjunctivae and lids are normal. Right eye exhibits no discharge. Left eye exhibits no discharge. No scleral icterus.  Neck: Normal range of motion. Neck supple.  Tender left occiput.  I don't appreciate a rash.    Cardiovascular: Normal rate.   Pulmonary/Chest: Effort normal.  Abdominal: Normal appearance. There is no splenomegaly or hepatomegaly.  Musculoskeletal: Normal range of motion.  Lymphadenopathy:    She has no cervical adenopathy.  Neurological: She is alert and  oriented to person, place, and time.  Skin: Skin is intact. No rash noted. No pallor.  Psychiatric: She has a normal mood and affect. Her behavior is normal. Judgment and thought content normal.    Results for orders placed or performed in visit on 05/25/16  Lipid Panel Piccolo, Arrow ElectronicsWaived  Result Value Ref Range   Cholesterol Piccolo, Waived 121 <200 mg/dL   HDL Chol Piccolo, Waived 49 (L) >59 mg/dL   Triglycerides Piccolo,Waived 69 <150 mg/dL   Chol/HDL Ratio Piccolo,Waive 2.4 mg/dL   LDL Chol Calc Piccolo Waived 58 <100 mg/dL   VLDL Chol Calc Piccolo,Waive 14 <30 mg/dL  Microalbumin, Urine Waived  Result Value Ref Range   Microalb, Ur Waived 10 0 - 19 mg/L   Creatinine, Urine Waived 100 10 - 300 mg/dL   Microalb/Creat Ratio <30 <30 mg/g  Comprehensive metabolic panel  Result Value Ref Range   Glucose 99 65 - 99 mg/dL   BUN 16 8 - 27 mg/dL   Creatinine, Ser 4.090.85 0.57 - 1.00 mg/dL   GFR calc non Af Amer 71 >59 mL/min/1.73   GFR calc Af Amer 81 >59 mL/min/1.73   BUN/Creatinine Ratio 19 12 - 28   Sodium 140 134 - 144 mmol/L   Potassium  4.8 3.5 - 5.2 mmol/L   Chloride 102 96 - 106 mmol/L   CO2 25 18 - 29 mmol/L   Calcium 9.3 8.7 - 10.3 mg/dL   Total Protein 6.8 6.0 - 8.5 g/dL   Albumin 3.9 3.6 - 4.8 g/dL   Globulin, Total 2.9 1.5 - 4.5 g/dL   Albumin/Globulin Ratio 1.3 1.2 - 2.2   Bilirubin Total 0.3 0.0 - 1.2 mg/dL   Alkaline Phosphatase 80 39 - 117 IU/L   AST 22 0 - 40 IU/L   ALT 12 0 - 32 IU/L  Bayer DCA Hb A1c Waived  Result Value Ref Range   Bayer DCA Hb A1c Waived 6.9 <7.0 %      Assessment & Plan:   Problem List Items Addressed This Visit      Unprioritized   Neck pain    I don't appreciate a rash.  Will use OTC NSAIDS          Follow up plan: Return if symptoms worsen or fail to improve.

## 2016-09-06 ENCOUNTER — Other Ambulatory Visit: Payer: Self-pay | Admitting: Family Medicine

## 2016-09-06 ENCOUNTER — Other Ambulatory Visit: Payer: Self-pay | Admitting: Unknown Physician Specialty

## 2016-09-06 NOTE — Telephone Encounter (Signed)
Your patient 

## 2016-10-31 ENCOUNTER — Other Ambulatory Visit: Payer: Self-pay | Admitting: Unknown Physician Specialty

## 2016-11-23 ENCOUNTER — Ambulatory Visit (INDEPENDENT_AMBULATORY_CARE_PROVIDER_SITE_OTHER): Payer: BLUE CROSS/BLUE SHIELD | Admitting: Unknown Physician Specialty

## 2016-11-23 ENCOUNTER — Encounter: Payer: Self-pay | Admitting: Unknown Physician Specialty

## 2016-11-23 VITALS — BP 110/69 | HR 70 | Temp 98.1°F | Ht 63.0 in | Wt 164.2 lb

## 2016-11-23 DIAGNOSIS — Z0000 Encounter for general adult medical examination without abnormal findings: Secondary | ICD-10-CM | POA: Diagnosis not present

## 2016-11-23 DIAGNOSIS — Z23 Encounter for immunization: Secondary | ICD-10-CM

## 2016-11-23 DIAGNOSIS — Z1211 Encounter for screening for malignant neoplasm of colon: Secondary | ICD-10-CM

## 2016-11-23 DIAGNOSIS — Z7189 Other specified counseling: Secondary | ICD-10-CM

## 2016-11-23 DIAGNOSIS — Z1231 Encounter for screening mammogram for malignant neoplasm of breast: Secondary | ICD-10-CM | POA: Insufficient documentation

## 2016-11-23 MED ORDER — QUINAPRIL HCL 10 MG PO TABS: 10.0000 mg | ORAL_TABLET | Freq: Every day | ORAL | 0 refills | Status: DC

## 2016-11-23 MED ORDER — LIRAGLUTIDE 18 MG/3ML ~~LOC~~ SOPN: 1.8000 mg | PEN_INJECTOR | Freq: Every morning | SUBCUTANEOUS | 12 refills | Status: DC

## 2016-11-23 MED ORDER — GLYBURIDE 5 MG PO TABS: 5.0000 mg | ORAL_TABLET | Freq: Two times a day (BID) | ORAL | 1 refills | Status: DC

## 2016-11-23 MED ORDER — PIOGLITAZONE HCL 30 MG PO TABS: 30.0000 mg | ORAL_TABLET | Freq: Every day | ORAL | 1 refills | Status: DC

## 2016-11-23 MED ORDER — GABAPENTIN 300 MG PO CAPS: ORAL_CAPSULE | ORAL | 0 refills | Status: DC

## 2016-11-23 MED ORDER — PRAVASTATIN SODIUM 40 MG PO TABS: 40.0000 mg | ORAL_TABLET | Freq: Every day | ORAL | 1 refills | Status: DC

## 2016-11-23 MED ORDER — LEVOTHYROXINE SODIUM 75 MCG PO TABS: 75.0000 ug | ORAL_TABLET | Freq: Every day | ORAL | 3 refills | Status: DC

## 2016-11-23 MED ORDER — RALOXIFENE HCL 60 MG PO TABS: 60.0000 mg | ORAL_TABLET | Freq: Every day | ORAL | 3 refills | Status: DC

## 2016-11-23 NOTE — Patient Instructions (Signed)

## 2016-11-23 NOTE — Progress Notes (Signed)
BP 110/69   Pulse 70   Temp 98.1 F (36.7 C) (Oral)   Ht 5\' 3"  (1.6 m)   Wt 164 lb 3.2 oz (74.5 kg)   LMP  (LMP Unknown)   SpO2 100%   BMI 29.09 kg/m    Subjective:    Patient ID: Emma Berry, female    DOB: 05-Nov-1950, 66 y.o.   MRN: [id number]  HPI: Emma Berry is a 66 y.o. female  Chief Complaint  Patient presents with  . Annual Exam  . Diabetes    pt states the last eye exam in her chart is correct    Chronic disease management 3 months ago.  Due in another 3 months.  No additional complaints today  Social History   Socioeconomic History  . Marital status: Married    Spouse name: Not on file  . Number of children: Not on file  . Years of education: Not on file  . Highest education level: Not on file  Social Needs  . Financial resource strain: Not on file  . Food insecurity - worry: Not on file  . Food insecurity - inability: Not on file  . Transportation needs - medical: Not on file  . Transportation needs - non-medical: Not on file  Occupational History  . Not on file  Tobacco Use  . Smoking status: Never Smoker  . Smokeless tobacco: Never Used  Substance and Sexual Activity  . Alcohol use: No    Alcohol/week: 0.0 oz  . Drug use: No  . Sexual activity: Yes  Other Topics Concern  . Not on file  Social History Narrative  . Not on file   Family History  Problem Relation Age of Onset  . Cancer Mother [id number]       melanoma  . Diabetes Mother   . Thyroid disease Mother   . Cancer Father 6870       prostate to bone  . Diabetes Daughter   . Heart disease Maternal Grandfather   . Stroke Paternal Grandmother   . Stroke Paternal Grandfather   . Breast cancer Neg Hx    Past Medical History:  Diagnosis Date  . Chronic kidney disease   . Diabetes mellitus without complication (HCC)   . Hyperlipidemia   . Hypertension   . Osteopenia   . Osteoporosis   . Thyroid disease    Past Surgical History:  Procedure Laterality Date  . CESAREAN SECTION        Relevant past medical, surgical, family and social history reviewed and updated as indicated. Interim medical history since our last visit reviewed. Allergies and medications reviewed and updated.  Review of Systems  Constitutional: Negative.   HENT: Negative.   Eyes: Negative.   Respiratory: Negative.   Cardiovascular: Negative.   Gastrointestinal: Negative.   Endocrine: Negative.   Genitourinary: Negative.   Musculoskeletal: Negative.   Skin: Negative.   Allergic/Immunologic: Negative.   Neurological: Negative.   Hematological: Negative.   Psychiatric/Behavioral: Negative.     Per HPI unless specifically indicated above     Objective:    BP 110/69   Pulse 70   Temp 98.1 F (36.7 C) (Oral)   Ht 5\' 3"  (1.6 m)   Wt 164 lb 3.2 oz (74.5 kg)   LMP  (LMP Unknown)   SpO2 100%   BMI 29.09 kg/m   Wt Readings from Last 3 Encounters:  11/23/16 164 lb 3.2 oz (74.5 kg)  08/23/16 163 lb 3.2 oz (74 kg)  05/25/16 161 lb 9.6 oz (73.3 kg)    Physical Exam  Constitutional: She is oriented to person, place, and time. She appears well-developed and well-nourished.  HENT:  Head: Normocephalic and atraumatic.  Eyes: Pupils are equal, round, and reactive to light. Right eye exhibits no discharge. Left eye exhibits no discharge. No scleral icterus.  Neck: Normal range of motion. Neck supple. Carotid bruit is not present. No thyromegaly present.  Cardiovascular: Normal rate, regular rhythm and normal heart sounds. Exam reveals no gallop and no friction rub.  No murmur heard. Pulmonary/Chest: Effort normal and breath sounds normal. No respiratory distress. She has no wheezes. She has no rales.  Abdominal: Soft. Bowel sounds are normal. There is no tenderness. There is no rebound.  Genitourinary: No breast swelling, tenderness or discharge.  Musculoskeletal: Normal range of motion.  Lymphadenopathy:    She has no cervical adenopathy.  Neurological: She is alert and oriented to  person, place, and time.  Skin: Skin is warm, dry and intact. No rash noted.  Psychiatric: She has a normal mood and affect. Her speech is normal and behavior is normal. Judgment and thought content normal. Cognition and memory are normal.    Results for orders placed or performed in visit on 05/25/16  Lipid Panel Piccolo, [employer]Waived  Result Value Ref Range   Cholesterol Piccolo, Waived 121 <200 mg/dL   HDL Chol Piccolo, Waived 49 (L) >59 mg/dL   Triglycerides Piccolo,Waived 69 <150 mg/dL   Chol/HDL Ratio Piccolo,Waive 2.4 mg/dL   LDL Chol Calc Piccolo Waived 58 <100 mg/dL   VLDL Chol Calc Piccolo,Waive 14 <30 mg/dL  Microalbumin, Urine Waived  Result Value Ref Range   Microalb, Ur Waived 10 0 - 19 mg/L   Creatinine, Urine Waived 100 10 - 300 mg/dL   Microalb/Creat Ratio <30 <30 mg/g  Comprehensive metabolic panel  Result Value Ref Range   Glucose 99 65 - 99 mg/dL   BUN 16 8 - 27 mg/dL   Creatinine, Ser 1.610.85 0.57 - 1.00 mg/dL   GFR calc non Af Amer 71 >59 mL/min/1.73   GFR calc Af Amer 81 >59 mL/min/1.73   BUN/Creatinine Ratio 19 12 - 28   Sodium 140 134 - 144 mmol/L   Potassium 4.8 3.5 - 5.2 mmol/L   Chloride 102 96 - 106 mmol/L   CO2 25 18 - 29 mmol/L   Calcium 9.3 8.7 - 10.3 mg/dL   Total Protein 6.8 6.0 - 8.5 g/dL   Albumin 3.9 3.6 - 4.8 g/dL   Globulin, Total 2.9 1.5 - 4.5 g/dL   Albumin/Globulin Ratio 1.3 1.2 - 2.2   Bilirubin Total 0.3 0.0 - 1.2 mg/dL   Alkaline Phosphatase 80 39 - 117 IU/L   AST 22 0 - 40 IU/L   ALT 12 0 - 32 IU/L  Bayer DCA Hb A1c Waived  Result Value Ref Range   Bayer DCA Hb A1c Waived 6.9 <7.0 %      Assessment & Plan:   Problem List Items Addressed This Visit      Unprioritized   Advanced care planning/counseling discussion    Has HCPOA and Living Will.  Reminded to bring in a copy.          Other Visit Diagnoses    Need for influenza vaccination    -  Primary   Relevant Orders   Flu vaccine HIGH DOSE PF (Completed)   Colon cancer  screening       Relevant Orders   Ambulatory  referral to Gastroenterology   Annual physical exam          Health maintenance ; Due for colonoscopy Pt states she will get eye exam Mammogram due 5/ 2020 Influenza vaccene due today   Follow up plan: Return in about 3 months (around 02/23/2017).

## 2016-11-23 NOTE — Assessment & Plan Note (Signed)
Has HCPOA and Living Will.  Reminded to bring in a copy.

## 2017-01-09 ENCOUNTER — Other Ambulatory Visit: Payer: Self-pay | Admitting: Unknown Physician Specialty

## 2017-02-15 ENCOUNTER — Other Ambulatory Visit: Payer: Self-pay | Admitting: Unknown Physician Specialty

## 2017-02-23 ENCOUNTER — Ambulatory Visit: Payer: BLUE CROSS/BLUE SHIELD | Admitting: Unknown Physician Specialty

## 2017-03-02 ENCOUNTER — Ambulatory Visit: Payer: Self-pay | Admitting: Unknown Physician Specialty

## 2017-03-07 ENCOUNTER — Other Ambulatory Visit: Payer: Self-pay | Admitting: Unknown Physician Specialty

## 2017-03-07 DIAGNOSIS — Z1211 Encounter for screening for malignant neoplasm of colon: Secondary | ICD-10-CM | POA: Diagnosis not present

## 2017-03-09 ENCOUNTER — Encounter: Payer: Self-pay | Admitting: Unknown Physician Specialty

## 2017-03-09 ENCOUNTER — Ambulatory Visit (INDEPENDENT_AMBULATORY_CARE_PROVIDER_SITE_OTHER): Payer: Medicare HMO | Admitting: Unknown Physician Specialty

## 2017-03-09 VITALS — BP 123/75 | HR 71 | Temp 98.1°F | Ht 63.0 in | Wt 163.4 lb

## 2017-03-09 DIAGNOSIS — E782 Mixed hyperlipidemia: Secondary | ICD-10-CM

## 2017-03-09 DIAGNOSIS — E1122 Type 2 diabetes mellitus with diabetic chronic kidney disease: Secondary | ICD-10-CM | POA: Diagnosis not present

## 2017-03-09 DIAGNOSIS — N181 Chronic kidney disease, stage 1: Secondary | ICD-10-CM

## 2017-03-09 DIAGNOSIS — I10 Essential (primary) hypertension: Secondary | ICD-10-CM

## 2017-03-09 LAB — BAYER DCA HB A1C WAIVED: HB A1C (BAYER DCA - WAIVED): 6.5 % (ref <7.0)

## 2017-03-09 NOTE — Assessment & Plan Note (Signed)
Stable, continue present medications.   

## 2017-03-09 NOTE — Progress Notes (Signed)
BP 123/75   Pulse 71   Temp 98.1 F (36.7 C) (Oral)   Ht 5\' 3"  (1.6 m)   Wt 163 lb 6.4 oz (74.1 kg)   LMP  (LMP Unknown)   SpO2 98%   BMI 28.95 kg/m    Subjective:    Patient ID: Emma Berry, female    DOB: 11-25-1950, 67 y.o.   MRN: [id number]  HPI: Emma PeakSandra Faye Scruton is a 67 y.o. female  Chief Complaint  Patient presents with  . Diabetes    pt states she has not had a recent eye exam   . Hyperlipidemia  . Hypertension   Diabetes: Using medications without difficulties No hypoglycemic episodes No hyperglycemic episodes Feet problems: None Blood Sugars averaging: Typically 100-120 Last Hgb A1C:6.9  Hypertension  Using medications without difficulty Average home BPs: "stays the same all the time"   Using medication without problems or lightheadedness No chest pain with exertion or shortness of breath No Edema  Elevated Cholesterol Using medications without problems No Muscle aches  Diet: Exercise: Exercise is not what it should be.     Relevant past medical, surgical, family and social history reviewed and updated as indicated. Interim medical history since our last visit reviewed. Allergies and medications reviewed and updated.  Review of Systems  Constitutional: Negative.   HENT: Negative.   Respiratory: Negative.   Cardiovascular: Negative.   Genitourinary: Negative.   Psychiatric/Behavioral: Negative.     Per HPI unless specifically indicated above     Objective:    BP 123/75   Pulse 71   Temp 98.1 F (36.7 C) (Oral)   Ht 5\' 3"  (1.6 m)   Wt 163 lb 6.4 oz (74.1 kg)   LMP  (LMP Unknown)   SpO2 98%   BMI 28.95 kg/m   Wt Readings from Last 3 Encounters:  03/09/17 163 lb 6.4 oz (74.1 kg)  11/23/16 164 lb 3.2 oz (74.5 kg)  08/23/16 163 lb 3.2 oz (74 kg)    Physical Exam  Constitutional: She is oriented to person, place, and time. She appears well-developed and well-nourished. No distress.  HENT:  Head: Normocephalic and atraumatic.    Eyes: Conjunctivae and lids are normal. Right eye exhibits no discharge. Left eye exhibits no discharge. No scleral icterus.  Neck: Normal range of motion. Neck supple. No JVD present. Carotid bruit is not present.  Cardiovascular: Normal rate, regular rhythm and normal heart sounds.  Pulmonary/Chest: Effort normal and breath sounds normal.  Abdominal: Normal appearance. There is no splenomegaly or hepatomegaly.  Musculoskeletal: Normal range of motion.  Neurological: She is alert and oriented to person, place, and time.  Skin: Skin is warm, dry and intact. No rash noted. No pallor.  Psychiatric: She has a normal mood and affect. Her behavior is normal. Judgment and thought content normal.            Assessment & Plan:   Problem List Items Addressed This Visit      Unprioritized   Diabetes (HCC) - Primary    Hgb A1C 6.5% demonstrating excellent control.  Continue present medications      Relevant Orders   Comprehensive metabolic panel   Bayer DCA Hb Y8MA1c Waived   Hyperlipidemia    Stable, continue present medications.        Hypertension    Stable, continue present medications.            Follow up plan: Return in about 6 months (around 09/06/2017) for physical.

## 2017-03-09 NOTE — Assessment & Plan Note (Signed)
Hgb A1C 6.5% demonstrating excellent control.  Continue present medications

## 2017-03-10 LAB — COMPREHENSIVE METABOLIC PANEL
A/G RATIO: 1.5 (ref 1.2–2.2)
ALBUMIN: 4.1 g/dL (ref 3.6–4.8)
ALT: 11 IU/L (ref 0–32)
AST: 15 IU/L (ref 0–40)
Alkaline Phosphatase: 85 IU/L (ref 39–117)
BILIRUBIN TOTAL: 0.3 mg/dL (ref 0.0–1.2)
BUN / CREAT RATIO: 18 (ref 12–28)
BUN: 16 mg/dL (ref 8–27)
CHLORIDE: 103 mmol/L (ref 96–106)
CO2: 21 mmol/L (ref 20–29)
CREATININE: 0.88 mg/dL (ref 0.57–1.00)
Calcium: 9.4 mg/dL (ref 8.7–10.3)
GFR calc Af Amer: 78 mL/min/{1.73_m2} (ref >59)
GFR calc non Af Amer: 68 mL/min/{1.73_m2} (ref >59)
GLOBULIN, TOTAL: 2.7 g/dL (ref 1.5–4.5)
Glucose: 105 mg/dL — ABNORMAL HIGH (ref 65–99)
POTASSIUM: 5.1 mmol/L (ref 3.5–5.2)
Sodium: 144 mmol/L (ref 134–144)
Total Protein: 6.8 g/dL (ref 6.0–8.5)

## 2017-05-02 ENCOUNTER — Encounter: Payer: Self-pay | Admitting: Student

## 2017-05-03 ENCOUNTER — Ambulatory Visit
Admission: RE | Admit: 2017-05-03 | Discharge: 2017-05-03 | Disposition: A | Discharge status: Home / Self Care | Payer: Medicare HMO | Source: Ambulatory Visit | Attending: Gastroenterology | Admitting: Gastroenterology

## 2017-05-03 ENCOUNTER — Ambulatory Visit: Payer: Medicare HMO | Admitting: Anesthesiology

## 2017-05-03 ENCOUNTER — Encounter: Payer: Self-pay | Admitting: *Deleted

## 2017-05-03 ENCOUNTER — Encounter
Admission: RE | Disposition: A | Discharge status: Home / Self Care | Payer: Self-pay | Source: Ambulatory Visit | Attending: Gastroenterology

## 2017-05-03 DIAGNOSIS — Z79890 Hormone replacement therapy (postmenopausal): Secondary | ICD-10-CM | POA: Insufficient documentation

## 2017-05-03 DIAGNOSIS — E1122 Type 2 diabetes mellitus with diabetic chronic kidney disease: Secondary | ICD-10-CM | POA: Diagnosis not present

## 2017-05-03 DIAGNOSIS — N189 Chronic kidney disease, unspecified: Secondary | ICD-10-CM | POA: Diagnosis not present

## 2017-05-03 DIAGNOSIS — Z1211 Encounter for screening for malignant neoplasm of colon: Secondary | ICD-10-CM | POA: Diagnosis not present

## 2017-05-03 DIAGNOSIS — Z79899 Other long term (current) drug therapy: Secondary | ICD-10-CM | POA: Diagnosis not present

## 2017-05-03 DIAGNOSIS — Z7982 Long term (current) use of aspirin: Secondary | ICD-10-CM | POA: Diagnosis not present

## 2017-05-03 DIAGNOSIS — Z794 Long term (current) use of insulin: Secondary | ICD-10-CM | POA: Insufficient documentation

## 2017-05-03 DIAGNOSIS — E079 Disorder of thyroid, unspecified: Secondary | ICD-10-CM | POA: Diagnosis not present

## 2017-05-03 DIAGNOSIS — M810 Age-related osteoporosis without current pathological fracture: Secondary | ICD-10-CM | POA: Insufficient documentation

## 2017-05-03 DIAGNOSIS — Z538 Procedure and treatment not carried out for other reasons: Secondary | ICD-10-CM | POA: Diagnosis not present

## 2017-05-03 DIAGNOSIS — I129 Hypertensive chronic kidney disease with stage 1 through stage 4 chronic kidney disease, or unspecified chronic kidney disease: Secondary | ICD-10-CM | POA: Insufficient documentation

## 2017-05-03 DIAGNOSIS — E785 Hyperlipidemia, unspecified: Secondary | ICD-10-CM | POA: Diagnosis not present

## 2017-05-03 HISTORY — PX: COLONOSCOPY WITH PROPOFOL: SHX5780

## 2017-05-03 LAB — GLUCOSE, CAPILLARY: Glucose-Capillary: 113 mg/dL — ABNORMAL HIGH (ref 65–99)

## 2017-05-03 SURGERY — COLONOSCOPY WITH PROPOFOL
Anesthesia: General

## 2017-05-03 MED ORDER — LIDOCAINE 2% (20 MG/ML) 5 ML SYRINGE
INTRAMUSCULAR | Status: DC | PRN
  Administered 2017-05-03: 30 mg via INTRAVENOUS

## 2017-05-03 MED ORDER — FENTANYL CITRATE (PF) 100 MCG/2ML IJ SOLN
INTRAMUSCULAR | Status: DC | PRN
  Administered 2017-05-03: 50 ug via INTRAVENOUS

## 2017-05-03 MED ORDER — FENTANYL CITRATE (PF) 100 MCG/2ML IJ SOLN
INTRAMUSCULAR | Status: AC
  Filled 2017-05-03: qty 2

## 2017-05-03 MED ORDER — PHENYLEPHRINE HCL 10 MG/ML IJ SOLN
INTRAMUSCULAR | Status: DC | PRN
  Administered 2017-05-03: 100 ug via INTRAVENOUS

## 2017-05-03 MED ORDER — EPINEPHRINE PF 1 MG/ML IJ SOLN
INTRAMUSCULAR | Status: AC
Start: 2017-05-03 — End: 2017-05-03
  Filled 2017-05-03: qty 1

## 2017-05-03 MED ORDER — EPHEDRINE SULFATE 50 MG/ML IJ SOLN
INTRAMUSCULAR | Status: AC
Start: 2017-05-03 — End: 2017-05-03
  Filled 2017-05-03: qty 1

## 2017-05-03 MED ORDER — PROPOFOL 500 MG/50ML IV EMUL
INTRAVENOUS | Status: DC | PRN
  Administered 2017-05-03: 140 ug/kg/min via INTRAVENOUS

## 2017-05-03 MED ORDER — PROPOFOL 10 MG/ML IV BOLUS
INTRAVENOUS | Status: AC
  Filled 2017-05-03: qty 20

## 2017-05-03 MED ORDER — SODIUM CHLORIDE 0.9 % IJ SOLN
INTRAMUSCULAR | Status: AC
  Filled 2017-05-03: qty 10

## 2017-05-03 MED ORDER — PROPOFOL 10 MG/ML IV BOLUS
INTRAVENOUS | Status: DC | PRN
  Administered 2017-05-03: 100 mg via INTRAVENOUS

## 2017-05-03 MED ORDER — LIDOCAINE HCL URETHRAL/MUCOSAL 2 % EX GEL
CUTANEOUS | Status: AC
  Filled 2017-05-03: qty 5

## 2017-05-03 MED ORDER — PROPOFOL 500 MG/50ML IV EMUL
INTRAVENOUS | Status: AC
  Filled 2017-05-03: qty 50

## 2017-05-03 MED ORDER — EPHEDRINE SULFATE 50 MG/ML IJ SOLN
INTRAMUSCULAR | Status: DC | PRN
  Administered 2017-05-03: 5 mg via INTRAVENOUS

## 2017-05-03 MED ORDER — SODIUM CHLORIDE 0.9 % IV SOLN
INTRAVENOUS | Status: DC
  Administered 2017-05-03 (×2): via INTRAVENOUS

## 2017-05-03 NOTE — Anesthesia Post-op Follow-up Note (Signed)
Anesthesia QCDR form completed.        

## 2017-05-03 NOTE — Transfer of Care (Signed)
Immediate Anesthesia Transfer of Care Note  Patient: Emma Berry  Procedure(s) Performed: COLONOSCOPY WITH PROPOFOL (N/A )  Patient Location: PACU and Endoscopy Unit  Anesthesia Type:General  Level of Consciousness: awake, drowsy and patient cooperative  Airway & Oxygen Therapy: Patient Spontanous Breathing and Patient connected to nasal cannula oxygen  Post-op Assessment: Report given to RN and Post -op Vital signs reviewed and stable  Post vital signs: Reviewed and stable  Last Vitals:  Vitals Value Taken Time  BP 80/43 05/03/2017  7:59 AM  Temp    Pulse 86 05/03/2017  7:58 AM  Resp 12 05/03/2017  7:58 AM  SpO2 97 % 05/03/2017  7:58 AM  Vitals shown include unvalidated device data.  Last Pain:  Vitals:   05/03/17 0652  TempSrc: Tympanic         Complications: No apparent anesthesia complications

## 2017-05-03 NOTE — Op Note (Signed)
[facility]enterology Patient Name: Emma MayhewSandra Berry Procedure Date: 05/03/2017 7:30 AM MRN: [id number] Account #: [id number] Date of Birth: [birth year] Admit Type: Outpatient Age: [age] [facility] Gender: Female Note Status: Finalized Procedure:            Colonoscopy Indications:          Screening for colorectal malignant neoplasm Providers:            Christena DeemMartin U. Skulskie, MD Referring MD:         Gabriel Cirriheryl Wicker (Referring MD) Medicines:            Monitored Anesthesia Care Complications:        No immediate complications. Procedure:            Pre-Anesthesia Assessment:                       - ASA Grade Assessment: II - A patient with mild                        systemic disease.                       After obtaining informed consent, the colonoscope was                        passed under direct vision. Throughout the procedure,                        the patient's blood pressure, pulse, and oxygen                        saturations were monitored continuously. The                        Colonoscope was introduced through the anus with the                        intention of advancing to the cecum. The scope was                        advanced to the descending colon before the procedure                        was aborted. Medications were given. The patient                        tolerated the procedure well. The quality of the bowel                        preparation was good. Findings:      The digital rectal exam was normal.      The scope was advanced through a circuitous pathway to about 40-45 cm       pathway. I became concerned that she may have an abdominal wall hernia       with possible bowel in this. The scope was removed, patient awakened and       questioned. Decision to terminate procedure for now, obtain abdominal       CT, Verify history and rule out hernia before proceding further. Impression:           -  No specimens  collected. Recommendation:       - Discharge patient to home.                       - Perform a CT scan (computed tomography) of abdomen                        with contrast and pelvis with contrast at appointment                        to be scheduled.                       - Rearrange colonoscopy depending on scan result.                       - Advance diet as tolerated. Procedure Code(s):    --- Professional ---                       [phone], 53, Colonoscopy, flexible; diagnostic, including                        collection of specimen(s) by brushing or washing, when                        performed (separate procedure) Diagnosis Code(s):    --- Professional ---                       Z12.11, Encounter for screening for malignant neoplasm                        of colon CPT copyright 2017 [employer]. All rights reserved. The codes documented in this report are preliminary and upon coder review may  be revised to meet current compliance requirements. [name], MD 05/03/2017 7:59:28 AM This report has been signed electronically. Number of Addenda: 0 Note Initiated On: 05/03/2017 7:30 AM Total Procedure Duration: 0 hours 15 minutes 3 seconds       [facility]

## 2017-05-03 NOTE — Progress Notes (Signed)
Procedure aborted due to possible hernia. Unable to reach cecum.

## 2017-05-03 NOTE — Anesthesia Preprocedure Evaluation (Signed)
Anesthesia Evaluation  Patient identified by MRN, date of birth, ID band Patient awake    Reviewed: Allergy & Precautions, NPO status , Patient's Chart, lab work & pertinent test results  History of Anesthesia Complications Negative for: history of anesthetic complications  Airway Mallampati: III  TM Distance: >3 FB Neck ROM: Full    Dental no notable dental hx.    Pulmonary neg pulmonary ROS, neg sleep apnea, neg COPD,    breath sounds clear to auscultation- rhonchi (-) wheezing      Cardiovascular hypertension, Pt. on medications (-) CAD, (-) Past MI, (-) Cardiac Stents and (-) CABG  Rhythm:Regular Rate:Normal - Systolic murmurs and - Diastolic murmurs    Neuro/Psych negative neurological ROS  negative psych ROS   GI/Hepatic negative GI ROS, Neg liver ROS,   Endo/Other  diabetes, Oral Hypoglycemic AgentsHypothyroidism   Renal/GU negative Renal ROS     Musculoskeletal negative musculoskeletal ROS (+)   Abdominal (+) - obese,   Peds  Hematology negative hematology ROS (+)   Anesthesia Other Findings Past Medical History: No date: Chronic kidney disease No date: Diabetes mellitus without complication (HCC) No date: Hyperlipidemia No date: Hypertension No date: Osteopenia No date: Osteoporosis No date: Thyroid disease   Reproductive/Obstetrics                             Anesthesia Physical Anesthesia Plan  ASA: II  Anesthesia Plan: General   Post-op Pain Management:    Induction: Intravenous  PONV Risk Score and Plan: 2 and Propofol infusion  Airway Management Planned: Natural Airway  Additional Equipment:   Intra-op Plan:   Post-operative Plan:   Informed Consent: I have reviewed the patients History and Physical, chart, labs and discussed the procedure including the risks, benefits and alternatives for the proposed anesthesia with the patient or authorized  representative who has indicated his/her understanding and acceptance.   Dental advisory given  Plan Discussed with: CRNA and Anesthesiologist  Anesthesia Plan Comments:         Anesthesia Quick Evaluation

## 2017-05-03 NOTE — H&P (Signed)
Outpatient short stay form Pre-procedure 05/03/2017 7:17 AM Emma DeemMartin U Zubayr Bednarczyk MD  Primary Physician:  Gabriel Cirriheryl Wicker, NP  Reason for visit: Colonoscopy  History of present illness: Patient is a 67 year old female presenting today as above.  Her last colonoscopy was over 10 years ago.  She tolerated her prep well.  She takes no aspirin or blood thinning agent with the exception of 81 mg aspirin that has been held for several days.    Current Facility-Administered Medications:  .  0.9 %  sodium chloride infusion, , Intravenous, Continuous, Emma DeemSkulskie, Lorenza Winkleman U, MD, Last Rate: 20 mL/hr at 05/03/17 0715  Medications Prior to Admission  Medication Sig Dispense Refill Last Dose  . aspirin 81 MG tablet Take 81 mg by mouth daily.   Past Week at Unknown time  . Calcium Citrate-Vitamin D (CALCIUM + D PO) Take by mouth daily.   Past Week at Unknown time  . cholecalciferol (VITAMIN D) 1000 UNITS tablet Take 1,000 Units by mouth daily.   Past Week at Unknown time  . ferrous fumarate (HEMOCYTE - 106 MG FE) 325 (106 FE) MG TABS tablet Take 1 tablet by mouth daily.   Past Week at Unknown time  . glyBURIDE (DIABETA) 5 MG tablet Take 1 tablet (5 mg total) 2 (two) times daily with a meal by mouth. 180 tablet 1 05/02/2017 at Unknown time  . levothyroxine (SYNTHROID, LEVOTHROID) 75 MCG tablet Take 1 tablet (75 mcg total) daily before breakfast by mouth. 90 tablet 3 05/02/2017 at Unknown time  . liraglutide (VICTOZA) 18 MG/3ML SOPN Inject 0.3 mLs (1.8 mg total) every morning into the skin. 3 pen 12 05/02/2017 at Unknown time  . metFORMIN (GLUCOPHAGE-XR) 500 MG 24 hr tablet TAKE 4 TABLET BY MOUTH ONCE DAILY FOR DIABETES. TAKE WITH BREAKFAST. 360 tablet 0 05/02/2017 at Unknown time  . pioglitazone (ACTOS) 30 MG tablet Take 1 tablet (30 mg total) daily by mouth. 90 tablet 1 05/02/2017 at Unknown time  . pravastatin (PRAVACHOL) 40 MG tablet Take 1 tablet (40 mg total) daily by mouth. 90 tablet 1 05/02/2017 at Unknown time  .  quinapril (ACCUPRIL) 10 MG tablet TAKE 1 TABLET BY MOUTH ONCE DAILY. 90 tablet 0 05/02/2017 at Unknown time  . raloxifene (EVISTA) 60 MG tablet Take 1 tablet (60 mg total) daily by mouth. 90 tablet 3 05/02/2017 at Unknown time  . vitamin B-12 (CYANOCOBALAMIN) 100 MCG tablet Take 100 mcg by mouth daily.   Past Week at Unknown time  . gabapentin (NEURONTIN) 300 MG capsule TAKE (1) CAPSULE BY MOUTH THREE TIMES DAILY (Patient not taking: Reported on 05/03/2017) 90 capsule 0 Not Taking at Unknown time  . Insulin Pen Needle (NOVOFINE) 32G X 6 MM MISC 1 Units by Does not apply route daily. (Patient not taking: Reported on 05/03/2017) 100 each 3 Not Taking at Unknown time  . ONE TOUCH ULTRA TEST test strip CHECK BLOOD SUGAR ONCE DAILY 50 each 12 Taking     No Known Allergies   Past Medical History:  Diagnosis Date  . Chronic kidney disease   . Diabetes mellitus without complication (HCC)   . Hyperlipidemia   . Hypertension   . Osteopenia   . Osteoporosis   . Thyroid disease     Review of systems:      Physical Exam    Heart and lungs: Without rub or gallop, lungs are bilaterally clear.    HEENT: Normocephalic atraumatic eyes are anicteric    Other:    Pertinant exam for  procedure: Soft nontender nondistended bowel sounds positive normoactive.    Planned proceedures: Colonoscopy and indicated procedures. I have discussed the risks benefits and complications of procedures to include not limited to bleeding, infection, perforation and the risk of sedation and the patient wishes to proceed.    Emma Deem, MD Gastroenterology 05/03/2017  7:17 AM     Emma Deem, MD Gastroenterology 05/03/2017  7:17 AM

## 2017-05-03 NOTE — Anesthesia Postprocedure Evaluation (Signed)
Anesthesia Post Note  Patient: Emma Berry  Procedure(s) Performed: COLONOSCOPY WITH PROPOFOL (N/A )  Patient location during evaluation: Endoscopy Anesthesia Type: General Level of consciousness: awake and alert and oriented Pain management: pain level controlled Vital Signs Assessment: post-procedure vital signs reviewed and stable Respiratory status: spontaneous breathing, nonlabored ventilation and respiratory function stable Cardiovascular status: blood pressure returned to baseline and stable Postop Assessment: no signs of nausea or vomiting Anesthetic complications: no     Last Vitals:  Vitals:   05/03/17 0816 05/03/17 0826  BP: (!) 94/42 (!) 103/52  Pulse:    Resp:    Temp:    SpO2:      Last Pain:  Vitals:   05/03/17 0826  TempSrc:   PainSc: 0-No pain                 Itay Mella

## 2017-05-04 ENCOUNTER — Encounter: Payer: Self-pay | Admitting: Gastroenterology

## 2017-05-04 ENCOUNTER — Other Ambulatory Visit: Payer: Self-pay | Admitting: Gastroenterology

## 2017-05-04 DIAGNOSIS — R1084 Generalized abdominal pain: Secondary | ICD-10-CM

## 2017-05-13 ENCOUNTER — Ambulatory Visit
Admission: RE | Admit: 2017-05-13 | Discharge: 2017-05-13 | Disposition: A | Discharge status: Home / Self Care | Payer: Medicare HMO | Source: Ambulatory Visit | Attending: Gastroenterology | Admitting: Gastroenterology

## 2017-05-13 DIAGNOSIS — K8020 Calculus of gallbladder without cholecystitis without obstruction: Secondary | ICD-10-CM | POA: Insufficient documentation

## 2017-05-13 DIAGNOSIS — K439 Ventral hernia without obstruction or gangrene: Secondary | ICD-10-CM | POA: Diagnosis not present

## 2017-05-13 DIAGNOSIS — R1084 Generalized abdominal pain: Secondary | ICD-10-CM | POA: Insufficient documentation

## 2017-05-13 DIAGNOSIS — I700 Atherosclerosis of aorta: Secondary | ICD-10-CM | POA: Insufficient documentation

## 2017-05-13 LAB — POCT I-STAT CREATININE: CREATININE: 0.9 mg/dL (ref 0.44–1.00)

## 2017-05-13 MED ORDER — IOPAMIDOL (ISOVUE-300) INJECTION 61%
100.0000 mL | Freq: Once | INTRAVENOUS | Status: AC | PRN
  Administered 2017-05-13: 100 mL via INTRAVENOUS

## 2017-06-01 ENCOUNTER — Other Ambulatory Visit: Payer: Self-pay | Admitting: Unknown Physician Specialty

## 2017-06-03 DIAGNOSIS — K432 Incisional hernia without obstruction or gangrene: Secondary | ICD-10-CM | POA: Diagnosis not present

## 2017-06-16 ENCOUNTER — Other Ambulatory Visit: Payer: Self-pay | Admitting: Gastroenterology

## 2017-06-16 DIAGNOSIS — R933 Abnormal findings on diagnostic imaging of other parts of digestive tract: Secondary | ICD-10-CM

## 2017-07-01 ENCOUNTER — Ambulatory Visit
Admission: RE | Admit: 2017-07-01 | Discharge: 2017-07-01 | Disposition: A | Discharge status: Home / Self Care | Payer: Medicare HMO | Source: Ambulatory Visit | Attending: Gastroenterology | Admitting: Gastroenterology

## 2017-07-01 DIAGNOSIS — R933 Abnormal findings on diagnostic imaging of other parts of digestive tract: Secondary | ICD-10-CM

## 2017-07-01 DIAGNOSIS — K8020 Calculus of gallbladder without cholecystitis without obstruction: Secondary | ICD-10-CM | POA: Diagnosis not present

## 2017-08-04 ENCOUNTER — Ambulatory Visit: Payer: Self-pay | Admitting: *Deleted

## 2017-08-04 NOTE — Telephone Encounter (Signed)
Pt reports had possible insect bite "Not sure what it was" last week to right hand. States hand, arm swelled, severe itching entire arm. States resolved after 3 days. Pt states she had been outside and could have been an insect bite. Presently states she has 2 areas on right ankle , noted yesterday. States has not been outside. Areas are "Purplish, a little larger than half dollar", with severe itching. States one "Starting on my left arm this morning."  Pt with H/O DM, states "ankle ones concern me." Denies any pain, tenderness at areas, no warmth, afebrile.  Has been applying hydrocortisone cream, ineffective. Denies any SOB, tongue ot throat swelling. Appt made for tomorrow with R. [name]. Care advise given per protocol.  Reason for Disposition . [1] SEVERE local itching (i.e., interferes with work, school, sleep) AND [2] not improved after 24 hours of hydrocortisone cream  Answer Assessment - Initial Assessment Questions 1. TYPE of INSECT: "What type of insect was it?"      Unsure 2. ONSET: "When did you get bitten?"      Unsure 3. LOCATION: "Where is the insect bite located?"      Right ankle 4. REDNESS: "Is the area red or pink?" If so, ask "What size is area of redness?" (inches or cm). "When did the redness start?"     Red, purplish now 5. PAIN: "Is there any pain?" If so, ask: "How bad is it?"  (Scale 1-10; or mild, moderate, severe)     no 6. ITCHING: "Does it itch?" If so, ask: "How bad is the itch?"    - MILD: doesn't interfere with normal activities   - MODERATE - SEVERE: interferes with work, school, sleep, or other activities      Severe, all the way around, Eases up sometimes, after scratching 7. SWELLING: "How big is the swelling?" (inches, cm, or compare to coins)     none 8. OTHER SYMPTOMS: "Do you have any other symptoms?"  (e.g., difficulty breathing, hives)     Left arm with 1 small area  Protocols used: INSECT BITE-A-AH

## 2017-08-05 ENCOUNTER — Ambulatory Visit (INDEPENDENT_AMBULATORY_CARE_PROVIDER_SITE_OTHER): Payer: Medicare HMO | Admitting: Family Medicine

## 2017-08-05 ENCOUNTER — Encounter: Payer: Self-pay | Admitting: Family Medicine

## 2017-08-05 VITALS — BP 104/66 | HR 67 | Temp 97.9°F | Ht 62.5 in | Wt 153.7 lb

## 2017-08-05 DIAGNOSIS — R21 Rash and other nonspecific skin eruption: Secondary | ICD-10-CM

## 2017-08-05 MED ORDER — CLOBETASOL PROPIONATE 0.05 % EX CREA: 1.0000 "application " | TOPICAL_CREAM | Freq: Two times a day (BID) | CUTANEOUS | 0 refills | Status: DC

## 2017-08-05 NOTE — Progress Notes (Signed)
   BP 104/66 (BP Location: Right Arm, Patient Position: Sitting, Cuff Size: Normal)   Pulse 67   Temp 97.9 F (36.6 C) (Oral)   Ht 5' 2.5" (1.588 m)   Wt 153 lb 11.2 oz (69.7 kg)   LMP  (LMP Unknown)   SpO2 98%   BMI 27.66 kg/m    Subjective:    Patient ID: Emma Berry, female    DOB: 01/26/1950, 67 y.o.   MRN: [id number]  HPI: Emma PeakSandra Faye Flippo is a 67 y.o. female  Chief Complaint  Patient presents with  . Rash    Rashes on various sites of limbs, ongoing for 2 weeks. Severely itching.    Patient here today with sporadic itchy rashes on lower legs and left arm x 2 weeks. Thinks they're insect bites from working out in her garden. Trying cortisone cream and benadryl cream with no relief. Denies fevers, chills, pain, known exposure to new foods or hygiene products.   Relevant past medical, surgical, family and social history reviewed and updated as indicated. Interim medical history since our last visit reviewed. Allergies and medications reviewed and updated.  Review of Systems  Per HPI unless specifically indicated above     Objective:    BP 104/66 (BP Location: Right Arm, Patient Position: Sitting, Cuff Size: Normal)   Pulse 67   Temp 97.9 F (36.6 C) (Oral)   Ht 5' 2.5" (1.588 m)   Wt 153 lb 11.2 oz (69.7 kg)   LMP  (LMP Unknown)   SpO2 98%   BMI 27.66 kg/m   Wt Readings from Last 3 Encounters:  08/05/17 153 lb 11.2 oz (69.7 kg)  05/03/17 163 lb (73.9 kg)  03/09/17 163 lb 6.4 oz (74.1 kg)    Physical Exam  Constitutional: She is oriented to person, place, and time. She appears well-developed and well-nourished. No distress.  HENT:  Head: Atraumatic.  Eyes: Conjunctivae and EOM are normal.  Neck: Normal range of motion.  Cardiovascular: Normal rate and regular rhythm.  Pulmonary/Chest: Effort normal and breath sounds normal.  Musculoskeletal: Normal range of motion.  Neurological: She is alert and oriented to person, place, and time.  Skin: Skin is  warm and dry.  Discrete erythematous macular lesions of b/l LEs and left arm  Psychiatric: She has a normal mood and affect. Her behavior is normal.  Nursing note and vitals reviewed.   Results for orders placed or performed during the hospital encounter of 05/13/17  I-STAT creatinine  Result Value Ref Range   Creatinine, Ser 0.90 0.44 - 1.00 mg/dL      Assessment & Plan:   Problem List Items Addressed This Visit    None    Visit Diagnoses    Rash    -  Primary   Allergic reaction to insect bites. Tx with short term clobetasol, benadryl BID. Avoid itching. Clean areas with mild soap and water daily       Follow up plan: Return if symptoms worsen or fail to improve.

## 2017-08-08 NOTE — Patient Instructions (Signed)
Follow up as needed

## 2017-08-24 ENCOUNTER — Telehealth: Payer: Self-pay

## 2017-08-24 NOTE — Telephone Encounter (Signed)
Copied from CRM [id number]. Topic: Medicare AWV >> Aug 24, 2017  1:19 PM OliverHill, [place] A, LPN wrote: Reason for CRM: called to reschedule medicare wellness visit from 11/21/2017 to a Thursday. Needs after 11/24/2017  Any questions please call [phone]

## 2017-08-28 ENCOUNTER — Other Ambulatory Visit: Payer: Self-pay | Admitting: Unknown Physician Specialty

## 2017-08-30 ENCOUNTER — Other Ambulatory Visit: Payer: Self-pay | Admitting: Unknown Physician Specialty

## 2017-08-30 ENCOUNTER — Other Ambulatory Visit: Payer: Self-pay | Admitting: Family Medicine

## 2017-09-27 ENCOUNTER — Other Ambulatory Visit: Payer: Self-pay | Admitting: Internal Medicine

## 2017-09-27 DIAGNOSIS — K432 Incisional hernia without obstruction or gangrene: Secondary | ICD-10-CM | POA: Diagnosis not present

## 2017-10-03 ENCOUNTER — Ambulatory Visit: Payer: Self-pay | Admitting: General Surgery

## 2017-10-03 NOTE — H&P (View-Only) (Signed)
HISTORY OF PRESENT ILLNESS:   Ms. Flury is a 67 y.o.female patient who comes for follow up of her incisional hernia  Ms. Beber reports was previously evaluated due to CT scan finding of incisional hernia. Patient has been in her usual state of health. She had screening colonoscopy on 05/03/17 and it was unable to be completed. Dr. Skulskie was not able to pass the scope more that 40-45 cm. The procedure was terminated due to concern of the large intestine being on a hernia. There is no intestine on the hernia as the CT scan. Patient was sent back to Gastroenterologist for complete evaluation of screening colon cancer and CT colonography was done which is negative for polyps or suspicious lesion.   Patient refers is having minimal discomfort on hernia site. Patient is worried of doing heavy lifting to avoid getting it worse. Refers tolerating diet and having regular bowel movements. Denies pain. No pain radiation. Nothing improves or aggravates pain. Patient is active in the garden and around the house without discomfort on the abdomen.   PAST MEDICAL HISTORY:  Past Medical History:  Diagnosis Date  . Colon polyp  . Diabetic acidosis, type II (CMS-HCC)  . High cholesterol  . Hypertension  . Thyroid disorder     PAST SURGICAL HISTORY:  Past Surgical History:  Procedure Laterality Date  . cesarean  . COLONOSCOPY 05/03/2017  no specimens collected. Procedure terminated early d/t possible abdominal hernia with possible bowel in it,    MEDICATIONS:  Outpatient Encounter Medications as of 09/27/2017  Medication Sig Dispense Refill  . aspirin 81 MG EC tablet Take 81 mg by mouth once daily  . calcium carbonate-vitamin D3 (CALTRATE 600+D) 600 mg(1,500mg) -200 unit tablet Take 1 tablet by mouth 2 (two) times daily with meals  . cholecalciferol (VITAMIN D3) 2,000 unit capsule Take 2,000 Units by mouth once daily  . cyanocobalamin (VITAMIN B12) 1000 MCG tablet Take 1,000 mcg by mouth once daily  .  ferrous sulfate 325 (65 FE) MG tablet Take 325 mg by mouth daily with breakfast  . gabapentin (NEURONTIN) 300 MG capsule Take 300 mg by mouth continuously as needed  . glyBURIDE (DIABETA) 5 MG tablet Take 5 mg by mouth 2 (two) times daily with meals  . levothyroxine (SYNTHROID, LEVOTHROID) 75 MCG tablet Take 75 mcg by mouth once daily Take on an empty stomach with a glass of water at least 30-60 minutes before breakfast.  . liraglutide (VICTOZA) 0.6 mg/0.1 mL (18 mg/3 mL) pen injector Inject 1.8 mg subcutaneously once daily  . metFORMIN (GLUCOPHAGE) 500 MG tablet Take 500 mg by mouth 2 (two) times daily with meals Patient takes 4 tablets daily.  . peg-electrolyte (NULYTELY) solution Take as directed for colonic prep. 4000 mL 0  . pioglitazone (ACTOS) 30 MG tablet Take 30 mg by mouth once daily  . pravastatin (PRAVACHOL) 40 MG tablet Take 40 mg by mouth nightly  . quinapril (ACCUPRIL) 10 MG tablet Take 10 mg by mouth once daily  . raloxifene (EVISTA) 60 mg tablet Take 60 mg by mouth once daily   No facility-administered encounter medications on file as of 09/27/2017.    ALLERGIES:  Patient has no known allergies.  SOCIAL HISTORY:  Social History   Socioeconomic History  . Marital status: Married  Spouse name: Not on file  . Number of children: Not on file  . Years of education: Not on file  . Highest education level: Not on file  Occupational History  . Not on   file  Social Needs  . Financial resource strain: Not on file  . Food insecurity:  Worry: Not on file  Inability: Not on file  . Transportation needs:  Medical: Not on file  Non-medical: Not on file  Tobacco Use  . Smoking status: Never Smoker  . Smokeless tobacco: Never Used  Substance and Sexual Activity  . Alcohol use: Never  Frequency: Never  . Drug use: Not on file  . Sexual activity: Not on file  Other Topics Concern  . Not on file  Social History Narrative  . Not on file   FAMILY HISTORY:  Family History   Problem Relation Age of Onset  . Melanoma Mother  . Diabetes type II Mother  . Prostate cancer Father  . Bone cancer Father    GENERAL REVIEW OF SYSTEMS:   General ROS: negative for - chills, fatigue, fever, weight gain or weight loss Allergy and Immunology ROS: negative for - hives  Hematological and Lymphatic ROS: negative for - bleeding problems or bruising, negative for palpable nodes Endocrine ROS: negative for - heat or cold intolerance, hair changes Respiratory ROS: negative for - cough, shortness of breath or wheezing Cardiovascular ROS: no chest pain or palpitations GI ROS: negative for nausea, vomiting, abdominal pain, diarrhea, constipation Musculoskeletal ROS: negative for - joint swelling or muscle pain Neurological ROS: negative for - confusion, syncope Dermatological ROS: negative for pruritus and rash  PHYSICAL EXAM:  Vitals:  09/27/17 0948  BP: 128/76  Pulse: 72  Temp: 37.3 C (99.1 F)  .  Ht:161.3 cm (5' 3.5") Wt:74.5 kg (164 lb 3.2 oz) BSA:Body surface area is 1.83 meters squared. Body mass index is 28.63 kg/m..  GENERAL: Alert, active, oriented x3  HEENT: Pupils equal reactive to light. Extraocular movements are intact. Sclera clear. Palpebral conjunctiva normal red color.Pharynx clear.  NECK: Supple with no palpable mass and no adenopathy.  LUNGS: Sound clear with no rales rhonchi or wheezes.  HEART: Regular rhythm S1 and S2 without murmur.  ABDOMEN: Soft and depressible, nontender with no palpable mass, no hepatomegaly. Infraumbilical midline scar, with large incarcerated, not tender, skin unremarkable.   EXTREMITIES: Well-developed well-nourished symmetrical with no dependent edema.  NEUROLOGICAL: Awake alert oriented, facial expression symmetrical, moving all extremities.   IMPRESSION:   Ms. Neyman is a 67 y.o. female presenting for evaluation for incisional hernia.   The patient presents with mild symptomatic, incarcerated incisional  hernia. Patient was oriented about the diagnosis of incisional hernia and its implication. The patient was oriented about the treatment alternatives (observation vs surgical repair). Since it is incarcerated, most likely with fat and is incisional of 4 cm, even though is not causing significant symptoms at this moment, repair is recommended as an elective basis. GI service ordered a CT colonography since it was not possible to complete a colonoscopy. CT colonography is negative for polyps or masses.   Patient oriented about surgical procedure, laparoscopic vs open incisional hernia repair with mesh. Patient oriented about risk of surgery including: bowel injury, bowel obstruction, infection, bleeding, injury to adjacent organs, pain, among others.    PLAN:  1. Laparoscopic vs open incisional hernia repair with mesh (49561, [id number], [id number]) 2. CBC, CMP 3. Stop taking aspirin 5 days before surgery 4. Contact us if has any question or concern  Patient verbalized understanding, all questions were answered, and were agreeable with the plan outlined above.   Krikor Willet Cintron-Diaz, MD  

## 2017-10-03 NOTE — H&P (Signed)
HISTORY OF PRESENT ILLNESS:   Ms. Veto KempsRudd is a 67 y.o.female patient who comes for follow up of her incisional hernia  Ms. Veto KempsRudd reports was previously evaluated due to CT scan finding of incisional hernia. Patient has been in her usual state of health. She had screening colonoscopy on 05/03/17 and it was unable to be completed. Dr. Marva PandaSkulskie was not able to pass the scope more that 40-45 cm. The procedure was terminated due to concern of the large intestine being on a hernia. There is no intestine on the hernia as the CT scan. Patient was sent back to Gastroenterologist for complete evaluation of screening colon cancer and CT colonography was done which is negative for polyps or suspicious lesion.   Patient refers is having minimal discomfort on hernia site. Patient is worried of doing heavy lifting to avoid getting it worse. Refers tolerating diet and having regular bowel movements. Denies pain. No pain radiation. Nothing improves or aggravates pain. Patient is active in the garden and around the house without discomfort on the abdomen.   PAST MEDICAL HISTORY:  Past Medical History:  Diagnosis Date  . Colon polyp  . Diabetic acidosis, type II (CMS-HCC)  . High cholesterol  . Hypertension  . Thyroid disorder     PAST SURGICAL HISTORY:  Past Surgical History:  Procedure Laterality Date  . cesarean  . COLONOSCOPY 05/03/2017  no specimens collected. Procedure terminated early d/t possible abdominal hernia with possible bowel in it,    MEDICATIONS:  Outpatient Encounter Medications as of 09/27/2017  Medication Sig Dispense Refill  . aspirin 81 MG EC tablet Take 81 mg by mouth once daily  . calcium carbonate-vitamin D3 (CALTRATE 600+D) 600 mg(1,500mg ) -200 unit tablet Take 1 tablet by mouth 2 (two) times daily with meals  . cholecalciferol (VITAMIN D3) 2,000 unit capsule Take 2,000 Units by mouth once daily  . cyanocobalamin (VITAMIN B12) 1000 MCG tablet Take 1,000 mcg by mouth once daily  .  ferrous sulfate 325 (65 FE) MG tablet Take 325 mg by mouth daily with breakfast  . gabapentin (NEURONTIN) 300 MG capsule Take 300 mg by mouth continuously as needed  . glyBURIDE (DIABETA) 5 MG tablet Take 5 mg by mouth 2 (two) times daily with meals  . levothyroxine (SYNTHROID, LEVOTHROID) 75 MCG tablet Take 75 mcg by mouth once daily Take on an empty stomach with a glass of water at least 30-60 minutes before breakfast.  . liraglutide (VICTOZA) 0.6 mg/0.1 mL (18 mg/3 mL) pen injector Inject 1.8 mg subcutaneously once daily  . metFORMIN (GLUCOPHAGE) 500 MG tablet Take 500 mg by mouth 2 (two) times daily with meals Patient takes 4 tablets daily.  . peg-electrolyte (NULYTELY) solution Take as directed for colonic prep. 4000 mL 0  . pioglitazone (ACTOS) 30 MG tablet Take 30 mg by mouth once daily  . pravastatin (PRAVACHOL) 40 MG tablet Take 40 mg by mouth nightly  . quinapril (ACCUPRIL) 10 MG tablet Take 10 mg by mouth once daily  . raloxifene (EVISTA) 60 mg tablet Take 60 mg by mouth once daily   No facility-administered encounter medications on file as of 09/27/2017.    ALLERGIES:  Patient has no known allergies.  SOCIAL HISTORY:  Social History   Socioeconomic History  . Marital status: Married  Spouse name: Not on file  . Number of children: Not on file  . Years of education: Not on file  . Highest education level: Not on file  Occupational History  . Not on  file  Social Needs  . Financial resource strain: Not on file  . Food insecurity:  Worry: Not on file  Inability: Not on file  . Transportation needs:  Medical: Not on file  Non-medical: Not on file  Tobacco Use  . Smoking status: Never Smoker  . Smokeless tobacco: Never Used  Substance and Sexual Activity  . Alcohol use: Never  Frequency: Never  . Drug use: Not on file  . Sexual activity: Not on file  Other Topics Concern  . Not on file  Social History Narrative  . Not on file   FAMILY HISTORY:  Family History   Problem Relation Age of Onset  . Melanoma Mother  . Diabetes type II Mother  . Prostate cancer Father  . Bone cancer Father    GENERAL REVIEW OF SYSTEMS:   General ROS: negative for - chills, fatigue, fever, weight gain or weight loss Allergy and Immunology ROS: negative for - hives  Hematological and Lymphatic ROS: negative for - bleeding problems or bruising, negative for palpable nodes Endocrine ROS: negative for - heat or cold intolerance, hair changes Respiratory ROS: negative for - cough, shortness of breath or wheezing Cardiovascular ROS: no chest pain or palpitations GI ROS: negative for nausea, vomiting, abdominal pain, diarrhea, constipation Musculoskeletal ROS: negative for - joint swelling or muscle pain Neurological ROS: negative for - confusion, syncope Dermatological ROS: negative for pruritus and rash  PHYSICAL EXAM:  Vitals:  09/27/17 0948  BP: 128/76  Pulse: 72  Temp: 37.3 C (99.1 F)  .  Ht:161.3 cm (5' 3.5") Wt:74.5 kg (164 lb 3.2 oz) WUJ:WJXB surface area is 1.83 meters squared. Body mass index is 28.63 kg/m.Marland Kitchen  GENERAL: Alert, active, oriented x3  HEENT: Pupils equal reactive to light. Extraocular movements are intact. Sclera clear. Palpebral conjunctiva normal red color.Pharynx clear.  NECK: Supple with no palpable mass and no adenopathy.  LUNGS: Sound clear with no rales rhonchi or wheezes.  HEART: Regular rhythm S1 and S2 without murmur.  ABDOMEN: Soft and depressible, nontender with no palpable mass, no hepatomegaly. Infraumbilical midline scar, with large incarcerated, not tender, skin unremarkable.   EXTREMITIES: Well-developed well-nourished symmetrical with no dependent edema.  NEUROLOGICAL: Awake alert oriented, facial expression symmetrical, moving all extremities.   IMPRESSION:   Ms. Tolson is a 67 y.o. female presenting for evaluation for incisional hernia.   The patient presents with mild symptomatic, incarcerated incisional  hernia. Patient was oriented about the diagnosis of incisional hernia and its implication. The patient was oriented about the treatment alternatives (observation vs surgical repair). Since it is incarcerated, most likely with fat and is incisional of 4 cm, even though is not causing significant symptoms at this moment, repair is recommended as an elective basis. GI service ordered a CT colonography since it was not possible to complete a colonoscopy. CT colonography is negative for polyps or masses.   Patient oriented about surgical procedure, laparoscopic vs open incisional hernia repair with mesh. Patient oriented about risk of surgery including: bowel injury, bowel obstruction, infection, bleeding, injury to adjacent organs, pain, among others.    PLAN:  1. Laparoscopic vs open incisional hernia repair with mesh ([id number], [id number], [id number]) 2. CBC, CMP 3. Stop taking aspirin 5 days before surgery 4. Contact us if has any question or concern  Patient verbalized understanding, all questions were answered, and were agreeable with the plan outlined above.   Carolan Shiver, MD

## 2017-10-04 ENCOUNTER — Encounter
Admission: RE | Admit: 2017-10-04 | Discharge: 2017-10-04 | Disposition: A | Discharge status: Home / Self Care | Payer: Medicare HMO | Source: Ambulatory Visit | Attending: General Surgery | Admitting: General Surgery

## 2017-10-04 ENCOUNTER — Other Ambulatory Visit: Payer: Self-pay

## 2017-10-04 DIAGNOSIS — Z01810 Encounter for preprocedural cardiovascular examination: Secondary | ICD-10-CM | POA: Diagnosis not present

## 2017-10-04 DIAGNOSIS — I10 Essential (primary) hypertension: Secondary | ICD-10-CM | POA: Diagnosis not present

## 2017-10-04 HISTORY — DX: Hypothyroidism, unspecified: E03.9

## 2017-10-04 NOTE — Patient Instructions (Signed)
Your procedure is scheduled on: Wednesday, October 12, 2017 Report to Day Surgery on the 2nd [facility]. To find out your arrival time, please call [phone] between 1PM - 3PM on: Tuesday, October 11, 2017  REMEMBER: Instructions that are not followed completely may result in serious medical risk, up to and including death; or upon the discretion of your surgeon and anesthesiologist your surgery may need to be rescheduled.  Do not eat food after midnight the night before surgery.  No gum chewing, lozengers or hard candies.  You may however, drink water up to 2 hours before you are scheduled to arrive for your surgery. Do not drink anything within 2 hours of the start of your surgery.  No Alcohol for 24 hours before or after surgery.  No Smoking including e-cigarettes for 24 hours prior to surgery.  No chewable tobacco products for at least 6 hours prior to surgery.  No nicotine patches on the day of surgery.  On the morning of surgery brush your teeth with toothpaste and water, you may rinse your mouth with mouthwash if you wish. Do not swallow any toothpaste or mouthwash.  Notify your doctor if there is any change in your medical condition (cold, fever, infection).  Do not wear jewelry, make-up, hairpins, clips or nail polish.  Do not wear lotions, powders, or perfumes. You may wear deodorant.  Do not shave 48 hours prior to surgery.   Contacts and dentures may not be worn into surgery.  Do not bring valuables to the hospital, including drivers license, insurance or credit cards.  [facility] is not responsible for any belongings or valuables.   TAKE THESE MEDICATIONS THE MORNING OF SURGERY:  1.  Levothyroxine  Use CHG Soap as directed on instruction sheet.  Stop Metformin 2 days prior to surgery. Last day to take is Sunday, September 29; resume after surgery.  Do NOT take any insulin on the morning of surgery.  Follow recommendations from Cardiologist,  Pulmonologist or PCP regarding stopping Aspirin. STOP 5 DAYS BEFORE SURGERY.  NOW!  Stop Anti-inflammatories (NSAIDS) such as Advil, Aleve, Ibuprofen, Motrin, Naproxen, Naprosyn and Aspirin based products such as Excedrin, Goodys Powder, BC Powder. (May take Tylenol or Acetaminophen if needed.)  NOW!  Stop ANY OVER THE COUNTER supplements until after surgery. (May continue Vitamin B, and multivitamin.)  Wear comfortable clothing (specific to your surgery type) to the hospital.  Plan for stool softeners for home use.  If you are being discharged the day of surgery, you will not be allowed to drive home. You will need a responsible adult to drive you home and stay with you that night.   If you are taking public transportation, you will need to have a responsible adult with you. Please confirm with your physician that it is acceptable to use public transportation.   Please call [phone] if you have any questions about these instructions.

## 2017-10-30 MED ORDER — CEFAZOLIN SODIUM-DEXTROSE 2-4 GM/100ML-% IV SOLN
2.0000 g | INTRAVENOUS | Status: AC
  Administered 2017-10-31: 2 g via INTRAVENOUS

## 2017-10-31 ENCOUNTER — Ambulatory Visit: Payer: Medicare HMO | Admitting: Anesthesiology

## 2017-10-31 ENCOUNTER — Encounter
Admission: RE | Disposition: A | Discharge status: Home / Self Care | Payer: Self-pay | Source: Ambulatory Visit | Attending: General Surgery

## 2017-10-31 ENCOUNTER — Encounter: Payer: Self-pay | Admitting: *Deleted

## 2017-10-31 ENCOUNTER — Other Ambulatory Visit: Payer: Self-pay

## 2017-10-31 ENCOUNTER — Ambulatory Visit
Admission: RE | Admit: 2017-10-31 | Discharge: 2017-10-31 | Disposition: A | Discharge status: Home / Self Care | Payer: Medicare HMO | Source: Ambulatory Visit | Attending: General Surgery | Admitting: General Surgery

## 2017-10-31 DIAGNOSIS — E785 Hyperlipidemia, unspecified: Secondary | ICD-10-CM | POA: Diagnosis not present

## 2017-10-31 DIAGNOSIS — E119 Type 2 diabetes mellitus without complications: Secondary | ICD-10-CM | POA: Insufficient documentation

## 2017-10-31 DIAGNOSIS — E7800 Pure hypercholesterolemia, unspecified: Secondary | ICD-10-CM | POA: Insufficient documentation

## 2017-10-31 DIAGNOSIS — I10 Essential (primary) hypertension: Secondary | ICD-10-CM | POA: Insufficient documentation

## 2017-10-31 DIAGNOSIS — Z5331 Laparoscopic surgical procedure converted to open procedure: Secondary | ICD-10-CM | POA: Diagnosis not present

## 2017-10-31 DIAGNOSIS — K430 Incisional hernia with obstruction, without gangrene: Secondary | ICD-10-CM | POA: Diagnosis not present

## 2017-10-31 DIAGNOSIS — Z7984 Long term (current) use of oral hypoglycemic drugs: Secondary | ICD-10-CM | POA: Insufficient documentation

## 2017-10-31 DIAGNOSIS — E1122 Type 2 diabetes mellitus with diabetic chronic kidney disease: Secondary | ICD-10-CM | POA: Diagnosis not present

## 2017-10-31 DIAGNOSIS — Z7982 Long term (current) use of aspirin: Secondary | ICD-10-CM | POA: Insufficient documentation

## 2017-10-31 DIAGNOSIS — E039 Hypothyroidism, unspecified: Secondary | ICD-10-CM | POA: Insufficient documentation

## 2017-10-31 DIAGNOSIS — Z79899 Other long term (current) drug therapy: Secondary | ICD-10-CM | POA: Insufficient documentation

## 2017-10-31 DIAGNOSIS — N189 Chronic kidney disease, unspecified: Secondary | ICD-10-CM | POA: Diagnosis not present

## 2017-10-31 DIAGNOSIS — K439 Ventral hernia without obstruction or gangrene: Secondary | ICD-10-CM | POA: Diagnosis not present

## 2017-10-31 DIAGNOSIS — I129 Hypertensive chronic kidney disease with stage 1 through stage 4 chronic kidney disease, or unspecified chronic kidney disease: Secondary | ICD-10-CM | POA: Diagnosis not present

## 2017-10-31 DIAGNOSIS — K432 Incisional hernia without obstruction or gangrene: Secondary | ICD-10-CM | POA: Diagnosis not present

## 2017-10-31 HISTORY — PX: VENTRAL HERNIA REPAIR: SHX424

## 2017-10-31 LAB — GLUCOSE, CAPILLARY
Glucose-Capillary: 131 mg/dL — ABNORMAL HIGH (ref 70–99)
Glucose-Capillary: 159 mg/dL — ABNORMAL HIGH (ref 70–99)

## 2017-10-31 SURGERY — REPAIR, HERNIA, VENTRAL, LAPAROSCOPIC
Anesthesia: General | Site: Abdomen

## 2017-10-31 MED ORDER — GABAPENTIN 300 MG PO CAPS: 300.0000 mg | ORAL_CAPSULE | Freq: Three times a day (TID) | ORAL | 0 refills | Status: DC

## 2017-10-31 MED ORDER — MIDAZOLAM HCL 2 MG/2ML IJ SOLN
INTRAMUSCULAR | Status: DC | PRN
  Administered 2017-10-31: 2 mg via INTRAVENOUS

## 2017-10-31 MED ORDER — EVICEL 5 ML EX KIT
PACK | CUTANEOUS | Status: DC | PRN
  Administered 2017-10-31: 1

## 2017-10-31 MED ORDER — FAMOTIDINE 20 MG PO TABS
20.0000 mg | ORAL_TABLET | Freq: Once | ORAL | Status: AC
  Administered 2017-10-31: 20 mg via ORAL

## 2017-10-31 MED ORDER — HYDROCODONE-ACETAMINOPHEN 5-325 MG PO TABS: 1.0000 | ORAL_TABLET | ORAL | 0 refills | Status: AC | PRN

## 2017-10-31 MED ORDER — DEXAMETHASONE SODIUM PHOSPHATE 10 MG/ML IJ SOLN
INTRAMUSCULAR | Status: DC | PRN
  Administered 2017-10-31: 10 mg via INTRAVENOUS

## 2017-10-31 MED ORDER — FENTANYL CITRATE (PF) 100 MCG/2ML IJ SOLN
25.0000 ug | INTRAMUSCULAR | Status: DC | PRN
  Administered 2017-10-31 (×4): 25 ug via INTRAVENOUS

## 2017-10-31 MED ORDER — BUPIVACAINE-EPINEPHRINE (PF) 0.5% -1:200000 IJ SOLN
INTRAMUSCULAR | Status: AC
  Filled 2017-10-31: qty 30

## 2017-10-31 MED ORDER — CEFAZOLIN SODIUM-DEXTROSE 2-4 GM/100ML-% IV SOLN
INTRAVENOUS | Status: AC
  Filled 2017-10-31: qty 100

## 2017-10-31 MED ORDER — ONDANSETRON HCL 4 MG/2ML IJ SOLN: 4.0000 mg | Freq: Once | INTRAMUSCULAR | Status: DC | PRN

## 2017-10-31 MED ORDER — FAMOTIDINE 20 MG PO TABS
ORAL_TABLET | ORAL | Status: AC
  Administered 2017-10-31: 20 mg via ORAL
  Filled 2017-10-31: qty 1

## 2017-10-31 MED ORDER — FENTANYL CITRATE (PF) 100 MCG/2ML IJ SOLN
INTRAMUSCULAR | Status: AC
  Filled 2017-10-31: qty 2

## 2017-10-31 MED ORDER — ONDANSETRON HCL 4 MG/2ML IJ SOLN
INTRAMUSCULAR | Status: DC | PRN
  Administered 2017-10-31 (×2): 4 mg via INTRAVENOUS

## 2017-10-31 MED ORDER — LIDOCAINE HCL (CARDIAC) PF 100 MG/5ML IV SOSY
PREFILLED_SYRINGE | INTRAVENOUS | Status: DC | PRN
  Administered 2017-10-31: 100 mg via INTRAVENOUS

## 2017-10-31 MED ORDER — SODIUM CHLORIDE 0.9 % IV SOLN
INTRAVENOUS | Status: DC
  Administered 2017-10-31 (×2): via INTRAVENOUS

## 2017-10-31 MED ORDER — SUGAMMADEX SODIUM 200 MG/2ML IV SOLN
INTRAVENOUS | Status: DC | PRN
  Administered 2017-10-31: 147.8 mg via INTRAVENOUS

## 2017-10-31 MED ORDER — PROPOFOL 10 MG/ML IV BOLUS
INTRAVENOUS | Status: DC | PRN
  Administered 2017-10-31: 150 mg via INTRAVENOUS

## 2017-10-31 MED ORDER — SUCCINYLCHOLINE CHLORIDE 20 MG/ML IJ SOLN
INTRAMUSCULAR | Status: DC | PRN
  Administered 2017-10-31: 100 mg via INTRAVENOUS

## 2017-10-31 MED ORDER — PHENYLEPHRINE HCL 10 MG/ML IJ SOLN
INTRAMUSCULAR | Status: DC | PRN
  Administered 2017-10-31 (×5): 100 ug via INTRAVENOUS

## 2017-10-31 MED ORDER — FENTANYL CITRATE (PF) 100 MCG/2ML IJ SOLN
INTRAMUSCULAR | Status: DC | PRN
  Administered 2017-10-31 (×4): 50 ug via INTRAVENOUS

## 2017-10-31 MED ORDER — BUPIVACAINE-EPINEPHRINE 0.5% -1:200000 IJ SOLN
INTRAMUSCULAR | Status: DC | PRN
  Administered 2017-10-31: 30 mL

## 2017-10-31 MED ORDER — ROCURONIUM BROMIDE 100 MG/10ML IV SOLN
INTRAVENOUS | Status: DC | PRN
  Administered 2017-10-31 (×2): 10 mg via INTRAVENOUS
  Administered 2017-10-31: 40 mg via INTRAVENOUS
  Administered 2017-10-31: 20 mg via INTRAVENOUS

## 2017-10-31 MED ORDER — ACETAMINOPHEN 10 MG/ML IV SOLN
INTRAVENOUS | Status: DC | PRN
  Administered 2017-10-31: 1000 mg via INTRAVENOUS

## 2017-10-31 SURGICAL SUPPLY — 69 items
Airseal 5 mm access port ×2 IMPLANT
BINDER ABDOMINAL 12 ML 46-62 (SOFTGOODS) ×2 IMPLANT
BLADE SURG 15 STRL LF DISP TIS (BLADE) ×1 IMPLANT
BLADE SURG 15 STRL SS (BLADE) ×2
BLADE SURG SZ11 CARB STEEL (BLADE) ×2 IMPLANT
CANISTER SUCT 1200ML W/VALVE (MISCELLANEOUS) ×3 IMPLANT
CHLORAPREP W/TINT 26ML (MISCELLANEOUS) ×3 IMPLANT
COVER WAND RF STERILE (DRAPES) ×1 IMPLANT
DERMABOND ADVANCED (GAUZE/BANDAGES/DRESSINGS) ×2
DERMABOND ADVANCED .7 DNX12 (GAUZE/BANDAGES/DRESSINGS) ×1 IMPLANT
DEVICE SECURE STRAP 25 ABSORB (INSTRUMENTS) ×1 IMPLANT
DRAPE LAPAROTOMY 100X77 ABD (DRAPES) ×3 IMPLANT
ELECT REM PT RETURN 9FT ADLT (ELECTROSURGICAL) ×3
ELECTRODE REM PT RTRN 9FT ADLT (ELECTROSURGICAL) ×1 IMPLANT
GAUZE SPONGE 4X4 12PLY STRL (GAUZE/BANDAGES/DRESSINGS) ×3 IMPLANT
GLOVE BIO SURGEON STRL SZ 6.5 (GLOVE) ×2 IMPLANT
GLOVE BIO SURGEON STRL SZ7.5 (GLOVE) ×3 IMPLANT
GLOVE BIO SURGEONS STRL SZ 6.5 (GLOVE) ×1
GLOVE BIOGEL PI IND STRL 6.5 (GLOVE) ×1 IMPLANT
GLOVE BIOGEL PI INDICATOR 6.5 (GLOVE) ×2
GOWN STRL REUS W/ TWL LRG LVL3 (GOWN DISPOSABLE) ×2 IMPLANT
GOWN STRL REUS W/TWL LRG LVL3 (GOWN DISPOSABLE) ×4
HEMOSTAT ARISTA ABSORB 1G (MISCELLANEOUS) ×2 IMPLANT
IRRIGATION STRYKERFLOW (MISCELLANEOUS) ×1 IMPLANT
IRRIGATOR STRYKERFLOW (MISCELLANEOUS) ×3
IV NS 1000ML (IV SOLUTION) ×2
IV NS 1000ML BAXH (IV SOLUTION) ×1 IMPLANT
KIT TURNOVER KIT A (KITS) ×3 IMPLANT
LABEL OR SOLS (LABEL) ×3 IMPLANT
LIGASURE LAP MARYLAND 5MM 37CM (ELECTROSURGICAL) ×2 IMPLANT
MESH VENTRALIGHT ST 4X6IN (Mesh General) ×2 IMPLANT
NDL FILTER BLUNT 18X1 1/2 (NEEDLE) ×1 IMPLANT
NDL HYPO 25X1 1.5 SAFETY (NEEDLE) ×1 IMPLANT
NEEDLE FILTER BLUNT 18X 1/2SAF (NEEDLE) ×2
NEEDLE FILTER BLUNT 18X1 1/2 (NEEDLE) ×1 IMPLANT
NEEDLE HYPO 25X1 1.5 SAFETY (NEEDLE) ×3 IMPLANT
NEEDLE VERESS 14GA 120MM (NEEDLE) ×2 IMPLANT
NS IRRIG 500ML POUR BTL (IV SOLUTION) ×3 IMPLANT
PACK BASIN MINOR ARMC (MISCELLANEOUS) ×3 IMPLANT
PACK LAP CHOLECYSTECTOMY (MISCELLANEOUS) ×3 IMPLANT
PORT ACCESS TROCAR AIRSEAL 5 (TROCAR) IMPLANT
SCISSORS METZENBAUM CVD 33 (INSTRUMENTS) ×3 IMPLANT
SEAL FOR SCOPE WARMER C3101 (MISCELLANEOUS) ×3 IMPLANT
SET TRI-LUMEN FLTR TB AIRSEAL (TUBING) ×2 IMPLANT
SLEEVE ENDOPATH XCEL 5M (ENDOMECHANICALS) ×3 IMPLANT
SPONGE KITTNER 5P (MISCELLANEOUS) ×2 IMPLANT
SPONGE LAP 18X18 RF (DISPOSABLE) ×2 IMPLANT
SUT ETHILON 5-0 FS-2 18 BLK (SUTURE) ×2 IMPLANT
SUT MNCRL 4-0 (SUTURE) ×2
SUT MNCRL 4-0 27XMFL (SUTURE) ×1
SUT PDS 2-0 27IN (SUTURE) ×6 IMPLANT
SUT PDS PLUS 0 (SUTURE) ×2
SUT PDS PLUS AB 0 CT-2 (SUTURE) ×1 IMPLANT
SUT PROLENE 0 CT 2 (SUTURE) ×6 IMPLANT
SUT VIC AB 2-0 CT1 27 (SUTURE) ×4
SUT VIC AB 2-0 CT1 TAPERPNT 27 (SUTURE) IMPLANT
SUT VIC AB 2-0 CT2 27 (SUTURE) ×3 IMPLANT
SUT VIC AB 2-0 SH 27 (SUTURE) ×2
SUT VIC AB 2-0 SH 27XBRD (SUTURE) ×1 IMPLANT
SUTURE MNCRL 4-0 27XMF (SUTURE) ×1 IMPLANT
SYR 10ML LL (SYRINGE) ×3 IMPLANT
SYR 5ML LL (SYRINGE) ×3 IMPLANT
TACKER 5MM HERNIA 3.5CML NAB (ENDOMECHANICALS) ×3 IMPLANT
TRAY FOLEY MTR SLVR 16FR STAT (SET/KITS/TRAYS/PACK) ×3 IMPLANT
TROCAR PORT AIRSEAL 5X120 (TROCAR) ×2 IMPLANT
TROCAR XCEL NON-BLD 11X100MML (ENDOMECHANICALS) ×3 IMPLANT
TROCAR XCEL NON-BLD 5MMX100MML (ENDOMECHANICALS) ×3 IMPLANT
TUBING INSUFFLATION (TUBING) ×1 IMPLANT
WATER STERILE IRR 1000ML POUR (IV SOLUTION) ×1 IMPLANT

## 2017-10-31 NOTE — Interval H&P Note (Signed)
History and Physical Interval Note:  10/31/2017 9:23 AM  Emma Berry  has presented today for surgery, with the diagnosis of incisional hernia  The various methods of treatment have been discussed with the patient and family. After consideration of risks, benefits and other options for treatment, the patient has consented to  Procedure(s): LAPAROSCOPIC VENTRAL HERNIA (N/A) HERNIA REPAIR VENTRAL ADULT (N/A) as a surgical intervention .  The patient's history has been reviewed, patient examined, no change in status, stable for surgery.  I have reviewed the patient's chart and labs.  Questions were answered to the patient's satisfaction.     Carolan Shiver

## 2017-10-31 NOTE — Discharge Instructions (Signed)

## 2017-10-31 NOTE — Anesthesia Procedure Notes (Signed)
Procedure Name: Intubation Date/Time: 10/31/2017 9:55 AM Performed by: Nelda Marseille, CRNA Pre-anesthesia Checklist: Patient identified, Patient being monitored, Timeout performed, Emergency Drugs available and Suction available Patient Re-evaluated:Patient Re-evaluated prior to induction Oxygen Delivery Method: Circle system utilized Preoxygenation: Pre-oxygenation with 100% oxygen Induction Type: IV induction Ventilation: Mask ventilation without difficulty Laryngoscope Size: Mac, 3 and McGraph Grade View: Grade II Tube type: Oral Tube size: 7.0 mm Number of attempts: 1 Airway Equipment and Method: Stylet Placement Confirmation: ETT inserted through vocal cords under direct vision,  positive ETCO2 and breath sounds checked- equal and bilateral Secured at: 21 cm Tube secured with: Tape Dental Injury: Teeth and Oropharynx as per pre-operative assessment  Difficulty Due To: Difficulty was anticipated and Difficult Airway- due to anterior larynx

## 2017-10-31 NOTE — Op Note (Signed)
Preoperative diagnosis: Ventral (incisional) hernia.  Postoperative diagnosis: Ventral (incisional) hernia.  Procedure: Laparoscopic Converted to open Ventral Hernia Repair                      Insertion of mesh  Anesthesia: GETA  Surgeon: Dr. Hazle Quant  Wound Classification: Clean  Indications: Patient is a 67 y.o. female developed a ventral hernia in the site of a previous incision. This was symptomatic and incarcerated and repair was indicated. Laparoscopic approach was elected.   Findings: 1. A 6 cm x 4 cm incisional hernia 2. A 12 cm x 10 cm Ventralight mesh used for repair 3. No hollow viscus organ injury identified during procedure 4. Tension free repair achieved 5. Adequate hemostasis  Description of procedure: The patient was brought into the operating room and placed on the table in the supine position. General anesthesia was administered. A time-out was completed verifying correct patient, procedure, site, positioning, and implant(s) and/ or special equipment prior to beginning this procedure. All bony prominences were padded. Both arms were tucked. A Foley was placed. An orogastric tube was placed. The abdomen was prepped from the nipples to proximal thighs and table to table with Chlorhexidine. The patient was draped in the usual sterile fashion.  The fascia was elevated and the Veress needle inserted. Proper position was confirmed by aspiration and saline meniscus test. Carbon dioxide was started on low flow. Once it was flowing easily, it was advanced to high flow.  The abdomen was insufflated with carbon dioxide to a pressure of 15 mmHg. The patient tolerated insufflation well. A 5 mm trocar was inserted in the left upper quadrant. The laparoscope was inserted and the abdomen inspected. Under direct visualization an additional 5-mm trocar was placed in the left lower quadrant. A 5-mm trocar was placed between these two ports. Care was taken to avoid injury to the bladder or  inferior epigastric vessels.  Upon examining the abdominal wall, the hernia appeared to be infra umbiliacally.  With grasper and Ligasure [place] device the incarcerated omentum was carefully reduced. Due to the size of the sac and space, all the incarcerated fat and sac was not able to be removed laparoscopically. Conversion to open needed to be done.  The old scar was excised. The incision was deepened to the fascia. The hernia sac was then identified and dissected free. The fascia was carefully palpated and additional defects were identified. Intervening fascial bridges were cut to create a single defect.  Bilateral myocutaneous flaps were created to release tension to be able to approximate the fascia to the midline. The midline fascia was dissected separating the anterior and posterior fascia creating a posterior component separation to further approximate the fascia to the midline.  The peritoneum and posterior fascia was approximated at midline and closed with a running Vicryl 0 suture. A piece of 12 cm x 6 cm mesh was placed between the posterior fascia and the rectus muscle and sutured circumferentially to the anterior fascia with PDS 0 interrumpted sutures. The mesh was irrigated with fibrin sealant. The anterior fascia was then closed with a running PDS 0 suture on midline. Dead space was irrigated with fibrin sealant subcutaneous tissues were closed with 3-0 Vicryl and the skin was closed with subcuticular sutures of Monocryl 4-0.  The skin was closed with 4-0 Monocryl. Dermabon was applied. The patient tolerated the procedure well and was brought to the PACU in stable condition.   Specimen: Hernia sac and content  Complications: None  Estimated Blood Loss: 50 mL

## 2017-10-31 NOTE — Anesthesia Preprocedure Evaluation (Signed)
Anesthesia Evaluation  Patient identified by MRN, date of birth, ID band Patient awake    Reviewed: Allergy & Precautions, NPO status , Patient's Chart, lab work & pertinent test results  History of Anesthesia Complications Negative for: history of anesthetic complications  Airway Mallampati: II       Dental   Pulmonary neg sleep apnea, neg COPD,           Cardiovascular hypertension, Pt. on medications (-) Past MI and (-) CHF (-) dysrhythmias (-) Valvular Problems/Murmurs     Neuro/Psych neg Seizures    GI/Hepatic neg GERD  ,  Endo/Other  diabetes, Type 2, Oral Hypoglycemic AgentsHypothyroidism   Renal/GU Renal InsufficiencyRenal disease     Musculoskeletal   Abdominal   Peds  Hematology   Anesthesia Other Findings   Reproductive/Obstetrics                             Anesthesia Physical Anesthesia Plan  ASA: III  Anesthesia Plan: General   Post-op Pain Management:    Induction: Intravenous  PONV Risk Score and Plan: 3 and Dexamethasone, Ondansetron and Midazolam  Airway Management Planned: Oral ETT  Additional Equipment:   Intra-op Plan:   Post-operative Plan:   Informed Consent: I have reviewed the patients History and Physical, chart, labs and discussed the procedure including the risks, benefits and alternatives for the proposed anesthesia with the patient or authorized representative who has indicated his/her understanding and acceptance.     Plan Discussed with:   Anesthesia Plan Comments:         Anesthesia Quick Evaluation

## 2017-10-31 NOTE — Transfer of Care (Signed)
Immediate Anesthesia Transfer of Care Note  Patient: Emma Berry  Procedure(s) Performed: LAPAROSCOPIC VENTRAL HERNIA (N/A Abdomen) HERNIA REPAIR VENTRAL ADULT (N/A Abdomen)  Patient Location: PACU  Anesthesia Type:General  Level of Consciousness: sedated  Airway & Oxygen Therapy: Patient Spontanous Breathing and Patient connected to face mask oxygen  Post-op Assessment: Report given to RN and Post -op Vital signs reviewed and stable  Post vital signs: Reviewed and stable  Last Vitals:  Vitals Value Taken Time  BP    Temp    Pulse    Resp    SpO2      Last Pain:  Vitals:   10/31/17 0853  TempSrc: Tympanic  PainSc: 0-No pain         Complications: No apparent anesthesia complications

## 2017-10-31 NOTE — Anesthesia Post-op Follow-up Note (Signed)
Anesthesia QCDR form completed.        

## 2017-11-02 LAB — SURGICAL PATHOLOGY

## 2017-11-02 NOTE — Anesthesia Postprocedure Evaluation (Signed)
Anesthesia Post Note  Patient: Emma Berry  Procedure(s) Performed: LAPAROSCOPIC VENTRAL HERNIA (N/A Abdomen) HERNIA REPAIR VENTRAL ADULT (N/A Abdomen)  Patient location during evaluation: PACU Anesthesia Type: General Level of consciousness: awake and alert Pain management: pain level controlled Vital Signs Assessment: post-procedure vital signs reviewed and stable Respiratory status: spontaneous breathing, nonlabored ventilation and respiratory function stable Cardiovascular status: blood pressure returned to baseline and stable Postop Assessment: no apparent nausea or vomiting Anesthetic complications: no     Last Vitals:  Vitals:   10/31/17 1348 10/31/17 1406  BP:  133/68  Pulse: 85 85  Resp: 16 18  Temp: 37 C 36.7 C  SpO2: 97% 94%    Last Pain:  Vitals:   10/31/17 1406  TempSrc: Temporal  PainSc: 3                  Nahshon Reich Garry Heater

## 2017-11-09 ENCOUNTER — Other Ambulatory Visit: Payer: Self-pay | Admitting: Unknown Physician Specialty

## 2017-11-16 ENCOUNTER — Other Ambulatory Visit: Payer: Self-pay | Admitting: Unknown Physician Specialty

## 2017-11-17 NOTE — Telephone Encounter (Signed)
Requested medication (s) are due for refill today:yes  Requested medication (s) are on the active medication list: yes  Last refill:  08/18/17  Future visit scheduled: yes  Notes to clinic:  Last TSH 11/17/17    Requested Prescriptions  Pending Prescriptions Disp Refills   SYNTHROID 75 MCG tablet [Pharmacy Med Name: SYNTHROID 75 MCG TABLET] 90 tablet 0    Sig: TAKE 1 TABLET BY MOUTH ONCE DAILY BEFORE BREAKFAST.     Endocrinology:  Hypothyroid Agents Failed - 11/16/2017  7:43 PM      Failed - TSH needs to be rechecked within 3 months after an abnormal result. Refill until TSH is due.      Failed - TSH in normal range and within 360 days    TSH  Date Value Ref Range Status  11/18/2014 1.760 0.450 - 4.500 uIU/mL Final         Passed - Valid encounter within last 12 months    Recent Outpatient Visits          3 months ago Rash   [facility] Roosvelt Maser [place], [place]   8 months ago Type 2 diabetes mellitus with stage 1 chronic kidney disease, without long-term current use of insulin (HCC)   [facility] Gabriel Cirri, NP   11 months ago Need for influenza vaccination   [facility] Gabriel Cirri, NP   1 year ago Neck pain   [facility] Gabriel Cirri, NP   1 year ago Type 2 diabetes mellitus with stage 1 chronic kidney disease, without long-term current use of insulin (HCC)   [facility] Gabriel Cirri, NP      Future Appointments            In 1 week  [facility]   In 1 week Maurice March, Salley Hews, PA-C [facility], [facility]         Signed Prescriptions Disp Refills   raloxifene (EVISTA) 60 MG tablet 90 tablet 0    Sig: TAKE 1 TABLET BY MOUTH ONCE DAILY.     OB/GYN:  Selective Estrogen Receptor Modulators Passed - 11/16/2017  7:43 PM      Passed - Valid encounter within last 12 months    Recent Outpatient Visits          3 months ago Rash   [facility] Particia Nearing, [place]   8 months ago Type 2 diabetes mellitus with stage 1 chronic kidney disease, without long-term current use of insulin (HCC)   [facility] Gabriel Cirri, NP   11 months ago Need for influenza vaccination   [facility] Gabriel Cirri, NP   1 year ago Neck pain   [facility] Gabriel Cirri, NP   1 year ago Type 2 diabetes mellitus with stage 1 chronic kidney disease, without long-term current use of insulin (HCC)   [facility] Gabriel Cirri, NP      Future Appointments            In 1 week  [facility], [facility]   In 1 week Maurice March, Salley Hews, PA-C [facility], [facility]

## 2017-11-17 NOTE — Telephone Encounter (Signed)
Requested Prescriptions  Pending Prescriptions Disp Refills  . raloxifene (EVISTA) 60 MG tablet [Pharmacy Med Name: RALOXIFENE HCL 60 MG TABLET] 90 tablet 0    Sig: TAKE 1 TABLET BY MOUTH ONCE DAILY.     OB/GYN:  Selective Estrogen Receptor Modulators Passed - 11/16/2017  7:43 PM      Passed - Valid encounter within last 12 months    Recent Outpatient Visits          3 months ago Rash   [facility] Particia [place], [place]   8 months ago Type 2 diabetes mellitus with stage 1 chronic kidney disease, without long-term current use of insulin (HCC)   [facility] Gabriel Cirri, NP   11 months ago Need for influenza vaccination   [facility] Gabriel Cirri, NP   1 year ago Neck pain   [facility] Gabriel Cirri, NP   1 year ago Type 2 diabetes mellitus with stage 1 chronic kidney disease, without long-term current use of insulin (HCC)   [facility] Gabriel Cirri, NP      Future Appointments            In 1 week  [facility], [facility]   In 1 week Maurice March, Salley Hews, PA-C [facility], [facility]         . SYNTHROID 75 MCG tablet [Pharmacy Med Name: SYNTHROID 75 MCG TABLET] 90 tablet 0    Sig: TAKE 1 TABLET BY MOUTH ONCE DAILY BEFORE BREAKFAST.     Endocrinology:  Hypothyroid Agents Failed - 11/16/2017  7:43 PM      Failed - TSH needs to be rechecked within 3 months after an abnormal result. Refill until TSH is due.      Failed - TSH in normal range and within 360 days    TSH  Date Value Ref Range Status  11/18/2014 1.760 0.450 - 4.500 uIU/mL Final         Passed - Valid encounter within last 12 months    Recent Outpatient Visits          3 months ago Rash   [facility] Roosvelt Maser [place], [place]   8 months ago Type 2 diabetes mellitus with stage 1 chronic kidney disease, without long-term current use of insulin (HCC)   [facility] Gabriel Cirri, NP   11 months  ago Need for influenza vaccination   [facility] Gabriel Cirri, NP   1 year ago Neck pain   [facility] Gabriel Cirri, NP   1 year ago Type 2 diabetes mellitus with stage 1 chronic kidney disease, without long-term current use of insulin (HCC)   [facility] Gabriel Cirri, NP      Future Appointments            In 1 week  [facility], [facility]   In 1 week Maurice March, Salley Hews, PA-C [facility], [facility]

## 2017-11-21 ENCOUNTER — Ambulatory Visit: Payer: Medicare HMO

## 2017-11-24 ENCOUNTER — Ambulatory Visit (INDEPENDENT_AMBULATORY_CARE_PROVIDER_SITE_OTHER): Payer: Medicare HMO

## 2017-11-24 VITALS — BP 122/60 | HR 73 | Temp 98.2°F | Ht 63.0 in | Wt 161.0 lb

## 2017-11-24 DIAGNOSIS — Z23 Encounter for immunization: Secondary | ICD-10-CM | POA: Diagnosis not present

## 2017-11-24 DIAGNOSIS — Z0000 Encounter for general adult medical examination without abnormal findings: Secondary | ICD-10-CM

## 2017-11-24 MED ORDER — ZOSTER VAC RECOMB ADJUVANTED 50 MCG/0.5ML IM SUSR: 0.5000 mL | Freq: Once | INTRAMUSCULAR | 1 refills | Status: AC

## 2017-11-24 NOTE — Progress Notes (Signed)
Subjective:   Emma DoingSandra F Berry is a 67 y.o. female who presents for Medicare Annual (Subsequent) preventive examination.  Last AWV-11/24/2015       Objective:     Vitals: BP 122/60 (BP Location: Left Arm, Patient Position: Sitting)   Pulse 73   Temp 98.2 F (36.8 C) (Oral)   Ht 5\' 3"  (1.6 m)   Wt 161 lb (73 kg)   LMP  (LMP Unknown)   SpO2 97%   BMI 28.52 kg/m   Body mass index is 28.52 kg/m.  Advanced Directives 11/24/2017 10/31/2017 10/04/2017 05/03/2017 02/24/2016 11/24/2015  Does Patient Have a Medical Advance Directive? No No No No No No  Would patient like information on creating a medical advance directive? Yes (MAU/Ambulatory/Procedural Areas - Information given) No - Patient declined No - Patient declined No - Patient declined - Yes - Transport plannerducational materials given    Tobacco Social History   Tobacco Use  Smoking Status Never Smoker  Smokeless Tobacco Never Used     Counseling given: Not Answered   Clinical Intake:  Pre-visit preparation completed: No  Pain : No/denies pain     Diabetes: Yes CBG done?: No Did pt. bring in CBG monitor from home?: No  How often do you need to have someone help you when you read instructions, pamphlets, or other written materials from your doctor or pharmacy?: 1 - Never What is the last grade level you completed in school?: college  Interpreter Needed?: No  Information entered by :: Tyron RussellSara , RN  Past Medical History:  Diagnosis Date  . Chronic kidney disease   . Diabetes mellitus without complication (HCC)   . Hyperlipidemia   . Hypertension   . Hypothyroidism   . Osteopenia   . Osteoporosis   . Thyroid disease    Past Surgical History:  Procedure Laterality Date  . CESAREAN SECTION    . COLONOSCOPY WITH PROPOFOL N/A 05/03/2017   Procedure: COLONOSCOPY WITH PROPOFOL;  Surgeon: Christena DeemSkulskie, Martin U, MD;  [facility];  Service: Endoscopy;  Laterality: N/A;  . VENTRAL HERNIA REPAIR N/A 10/31/2017   Procedure: LAPAROSCOPIC VENTRAL HERNIA;  Surgeon: Carolan Shiverintron-Diaz, Edgardo, MD;  Location: [facility] ORS;  Service: General;  Laterality: N/A;  . VENTRAL HERNIA REPAIR N/A 10/31/2017   Procedure: HERNIA REPAIR VENTRAL ADULT;  Surgeon: Carolan Shiverintron-Diaz, Edgardo, MD;  Location: [facility] ORS;  Service: General;  Laterality: N/A;   Family History  Problem Relation Age of Onset  . Cancer Mother [id number]       melanoma  . Diabetes Mother   . Thyroid disease Mother   . Cancer Father [id number]       prostate to bone  . Diabetes Daughter   . Heart disease Maternal Grandfather   . Stroke Paternal Grandmother   . Stroke Paternal Grandfather   . Breast cancer Neg Hx    Social History   Socioeconomic History  . Marital status: Married    Spouse name: Not on file  . Number of children: Not on file  . Years of education: Not on file  . Highest education level: Not on file  Occupational History  . Not on file  Social Needs  . Financial resource strain: Not hard at all  . Food insecurity:    Worry: Never true    Inability: Never true  . Transportation needs:    Medical: No    Non-medical: No  Tobacco Use  . Smoking status: Never Smoker  . Smokeless tobacco: Never Used  Substance and  Sexual Activity  . Alcohol use: No    Alcohol/week: 0.0 standard drinks  . Drug use: No  . Sexual activity: Yes  Lifestyle  . Physical activity:    Days per week: 3 days    Minutes per session: 30 min  . Stress: Only a little  Relationships  . Social connections:    Talks on phone: More than three times a week    Gets together: More than three times a week    Attends religious service: More than 4 times per year    Active member of club or organization: No    Attends meetings of clubs or organizations: Never    Relationship status: Married  Other Topics Concern  . Not on file  Social History Narrative  . Not on file    Outpatient Encounter Medications as of 11/24/2017  Medication Sig  . aspirin EC 81 MG tablet Take 81  mg by mouth at bedtime.  . calcium carbonate (CALCIUM 600) 600 MG TABS tablet Take 600 mg by mouth at bedtime.  . Cholecalciferol (VITAMIN D3) 5000 units CAPS Take 1 capsule by mouth daily.  . ferrous gluconate (FERGON) 225 (27 Fe) MG tablet Take 240 mg by mouth daily.  Marland Kitchen gabapentin (NEURONTIN) 300 MG capsule TAKE (1) CAPSULE BY MOUTH THREE TIMES DAILY  . glyBURIDE (DIABETA) 5 MG tablet TAKE (1) TABLET TWICE A DAY WITH FOOD---BREAKFAST AND SUPPER. (Patient taking differently: Take 5 mg by mouth 2 (two) times daily. )  . metFORMIN (GLUCOPHAGE-XR) 500 MG 24 hr tablet TAKE 4 TABLET BY MOUTH ONCE DAILY FOR DIABETES. TAKE WITH BREAKFAST. (Patient taking differently: Take 2,000 mg by mouth daily with breakfast. )  . ONE TOUCH ULTRA TEST test strip CHECK BLOOD SUGAR ONCE DAILY  . pioglitazone (ACTOS) 30 MG tablet TAKE (1) TABLET BY MOUTH DAILY FOR DIABETES (Patient taking differently: Take 30 mg by mouth daily. )  . pravastatin (PRAVACHOL) 40 MG tablet TAKE 1 TABLET BY MOUTH AT BEDTIME FOR CHOLESTEROL (Patient taking differently: Take 40 mg by mouth every evening. )  . quinapril (ACCUPRIL) 10 MG tablet TAKE 1 TABLET BY MOUTH ONCE DAILY. (Patient taking differently: Take 10 mg by mouth at bedtime. )  . raloxifene (EVISTA) 60 MG tablet TAKE 1 TABLET BY MOUTH ONCE DAILY.  . SYNTHROID 75 MCG tablet TAKE 1 TABLET BY MOUTH ONCE DAILY BEFORE BREAKFAST.  Marland Kitchen VICTOZA 18 MG/3ML SOPN INJECT 1.8MG  S.Q. ONCE DAILY.  . vitamin B-12 (CYANOCOBALAMIN) 1000 MCG tablet Take 1,000 mcg by mouth at bedtime.  Marland Kitchen Zoster Vaccine Adjuvanted [facility]) injection Inject 0.5 mLs into the muscle once for 1 dose.  . [DISCONTINUED] Zoster Vaccine Adjuvanted [facility]) injection Inject 0.5 mLs into the muscle once.  . gabapentin (NEURONTIN) 300 MG capsule Take 1 capsule (300 mg total) by mouth 3 (three) times daily for 15 days.   No facility-administered encounter medications on file as of 11/24/2017.     Activities of Daily Living In  your present state of health, do you have any difficulty performing the following activities: 11/24/2017 10/04/2017  Hearing? N N  Vision? N N  Difficulty concentrating or making decisions? N N  Walking or climbing stairs? N N  Dressing or bathing? N N  Berry errands, shopping? N N  Preparing Food and eating ? N -  Using the Toilet? N -  In the past six months, have you accidently leaked urine? N -  Do you have problems with loss of bowel control? N -  Managing your Medications? N -  Managing your Finances? N -  Housekeeping or managing your Housekeeping? N -  Some recent data might be hidden    Patient Care Team: Particia Nearing, PA-C as PCP - General (Family Medicine)    Assessment:   This is a routine wellness examination for Emma Berry.  Exercise Activities and Dietary recommendations Current Exercise Habits: Home exercise routine, Type of exercise: walking, Time (Minutes): 30, Frequency (Times/Week): 2, Weekly Exercise (Minutes/Week): 60, Intensity: Mild, Exercise limited by: None identified  Goals   None     Fall Risk Fall Risk  11/24/2017 11/23/2016 11/24/2015 11/18/2014  Falls in the past year? 0 No No No  Number falls in past yr: 0 - - -  Injury with Fall? 0 - - -   Is the patient's home free of loose throw rugs in walkways, pet beds, electrical cords, etc?   yes      Grab bars in the bathroom? no      Handrails on the stairs?   yes      Adequate lighting?   yes  Depression Screen PHQ 2/9 Scores 11/24/2017 11/23/2016 11/24/2015 11/18/2014  PHQ - 2 Score 0 0 0 0  PHQ- 9 Score - 0 0 -     Cognitive Function     6CIT Screen 11/24/2017  What Year? 0 points  What month? 0 points  What time? 0 points  Count back from 20 0 points  Months in reverse 0 points  Repeat phrase 2 points  Total Score 2    Immunization History  Administered Date(s) Administered  . Influenza, High Dose Seasonal PF 11/24/2015, 11/23/2016, 11/24/2017  . Influenza,inj,Quad PF,6+  Mos 11/18/2014  . Pneumococcal Conjugate-13 11/18/2014  . Pneumococcal Polysaccharide-23 04/16/2013  . Td 08/19/2004  . Tdap 11/18/2014  . Zoster 10/01/2009    Qualifies for Shingles Vaccine? Yes, educated and ordered to pharmacy  Screening Tests Health Maintenance  Topic Date Due  . OPHTHALMOLOGY EXAM  03/30/2016  . HEMOGLOBIN A1C  09/06/2017  . FOOT EXAM  11/23/2017  . MAMMOGRAM  05/14/2018  . TETANUS/TDAP  11/17/2024  . COLONOSCOPY  05/04/2027  . INFLUENZA VACCINE  Completed  . Hepatitis C Screening  Completed  . PNA vac Low Risk Adult  Completed  . DEXA SCAN  Addressed    Cancer Screenings: Lung: Low Dose CT Chest recommended if Age 80-80 years, 30 pack-year currently smoking OR have quit w/in 15years. Patient does not qualify. Breast:  Up to date on Mammogram? Yes   Up to date of Bone Density/Dexa? Yes Colorectal: up to date  Additional Screenings:  Hepatitis C Screening: declined High dose flu vaccine due: given Patient stated she will make next eye appointment     Plan:    I have personally reviewed and addressed the Medicare Annual Wellness questionnaire and have noted the following in the patient's chart:  A. Medical and social history B. Use of alcohol, tobacco or illicit drugs  C. Current medications and supplements D. Functional ability and status E.  Nutritional status F.  Physical activity G. Advance directives H. List of other physicians I.  Hospitalizations, surgeries, and ER visits in previous 12 months J.  Vitals K. Screenings to include hearing, vision, cognitive, depression L. Referrals and appointments - none  In addition, I have reviewed and discussed with patient certain preventive protocols, quality metrics, and best practice recommendations. A written personalized care plan for preventive services as well as general preventive health recommendations were  provided to patient.  See attached scanned questionnaire for additional information.     Signed,   Tyron Russell, RN Nurse Health Advisor  Patient Concerns: None

## 2017-11-24 NOTE — Patient Instructions (Signed)
[name] , Thank you for taking time to come for your Medicare Wellness Visit. I appreciate your ongoing commitment to your health goals. Please review the following plan we discussed and let me know if I can assist you in the future.   Screening recommendations/referrals: Colonoscopy up to date, last one completed 05/03/2017 Mammogram up to date, due 05/14/2018 Bone Density up to date Recommended yearly ophthalmology/optometry visit for glaucoma screening and checkup Recommended yearly dental visit for hygiene and checkup  Vaccinations: Influenza vaccine high dose given today Pneumococcal vaccine up to date, completed Tdap vaccine up to date, due 11/17/2024 Shingles vaccine due, ordered to pharmacy    Advanced directives: Advance directive discussed with you today. I have provided a copy for you to complete at home and have notarized. Once this is complete please bring a copy in to our office so we can scan it into your chart.  Conditions/risks identified: none  Next appointment: Emma Berry 11/28/2017 @ 8am            Medicare Wellness 11/29/2018 @ 8:45am   Preventive Care 69 Years and Older, Female Preventive care refers to lifestyle choices and visits with your health care provider that can promote health and wellness. What does preventive care include?  A yearly physical exam. This is also called an annual well check.  Dental exams once or twice a year.  Routine eye exams. Ask your health care provider how often you should have your eyes checked.  Personal lifestyle choices, including:  Daily care of your teeth and gums.  Regular physical activity.  Eating a healthy diet.  Avoiding tobacco and drug use.  Limiting alcohol use.  Practicing safe sex.  Taking low-dose aspirin every day.  Taking vitamin and mineral supplements as recommended by your health care provider. What happens during an annual well check? The services and screenings done by your health care provider  during your annual well check will depend on your age, overall health, lifestyle risk factors, and family history of disease. Counseling  Your health care provider may ask you questions about your:  Alcohol use.  Tobacco use.  Drug use.  Emotional well-being.  Home and relationship well-being.  Sexual activity.  Eating habits.  History of falls.  Memory and ability to understand (cognition).  Work and work Astronomerenvironment.  Reproductive health. Screening  You may have the following tests or measurements:  Height, weight, and BMI.  Blood pressure.  Lipid and cholesterol levels. These may be checked every 5 years, or more frequently if you are over 69 years old.  Skin check.  Lung cancer screening. You may have this screening every year starting at age 69 if you have a 30-pack-year history of smoking and currently smoke or have quit within the past 15 years.  Fecal occult blood test (FOBT) of the stool. You may have this test every year starting at age 69.  Flexible sigmoidoscopy or colonoscopy. You may have a sigmoidoscopy every 5 years or a colonoscopy every 10 years starting at age 69.  Hepatitis C blood test.  Hepatitis B blood test.  Sexually transmitted disease (STD) testing.  Diabetes screening. This is done by checking your blood sugar (glucose) after you have not eaten for a while (fasting). You may have this done every 1-3 years.  Bone density scan. This is done to screen for osteoporosis. You may have this done starting at age 69.  Mammogram. This may be done every 1-2 years. Talk to your health care provider  about how often you should have regular mammograms. Talk with your health care provider about your test results, treatment options, and if necessary, the need for more tests. Vaccines  Your health care provider may recommend certain vaccines, such as:  Influenza vaccine. This is recommended every year.  Tetanus, diphtheria, and acellular pertussis  (Tdap, Td) vaccine. You may need a Td booster every 10 years.  Zoster vaccine. You may need this after age 65.  Pneumococcal 13-valent conjugate (PCV13) vaccine. One dose is recommended after age 69.  Pneumococcal polysaccharide (PPSV23) vaccine. One dose is recommended after age 69. Talk to your health care provider about which screenings and vaccines you need and how often you need them. This information is not intended to replace advice given to you by your health care provider. Make sure you discuss any questions you have with your health care provider. Document Released: 01/24/2015 Document Revised: 09/17/2015 Document Reviewed: 10/29/2014 Elsevier Interactive Patient Education  2017 [place] Prevention in the Home Falls can cause injuries. They can happen to people of all ages. There are many things you can do to make your home safe and to help prevent falls. What can I do on the outside of my home?  Regularly fix the edges of walkways and driveways and fix any cracks.  Remove anything that might make you trip as you walk through a door, such as a raised step or threshold.  Trim any bushes or trees on the path to your home.  Use bright outdoor lighting.  Clear any walking paths of anything that might make someone trip, such as rocks or tools.  Regularly check to see if handrails are loose or broken. Make sure that both sides of any steps have handrails.  Any raised decks and porches should have guardrails on the edges.  Have any leaves, snow, or ice cleared regularly.  Use sand or salt on walking paths during winter.  Clean up any spills in your garage right away. This includes oil or grease spills. What can I do in the bathroom?  Use night lights.  Install grab bars by the toilet and in the tub and shower. Do not use towel bars as grab bars.  Use non-skid mats or decals in the tub or shower.  If you need to sit down in the shower, use a plastic, non-slip  stool.  Keep the floor dry. Clean up any water that spills on the floor as soon as it happens.  Remove soap buildup in the tub or shower regularly.  Attach bath mats securely with double-sided non-slip rug tape.  Do not have throw rugs and other things on the floor that can make you trip. What can I do in the bedroom?  Use night lights.  Make sure that you have a light by your bed that is easy to reach.  Do not use any sheets or blankets that are too big for your bed. They should not hang down onto the floor.  Have a firm chair that has side arms. You can use this for support while you get dressed.  Do not have throw rugs and other things on the floor that can make you trip. What can I do in the kitchen?  Clean up any spills right away.  Avoid walking on wet floors.  Keep items that you use a lot in easy-to-reach places.  If you need to reach something above you, use a strong step stool that has a grab bar.  Keep electrical cords out of the way.  Do not use floor polish or wax that makes floors slippery. If you must use wax, use non-skid floor wax.  Do not have throw rugs and other things on the floor that can make you trip. What can I do with my stairs?  Do not leave any items on the stairs.  Make sure that there are handrails on both sides of the stairs and use them. Fix handrails that are broken or loose. Make sure that handrails are as long as the stairways.  Check any carpeting to make sure that it is firmly attached to the stairs. Fix any carpet that is loose or worn.  Avoid having throw rugs at the top or bottom of the stairs. If you do have throw rugs, attach them to the floor with carpet tape.  Make sure that you have a light switch at the top of the stairs and the bottom of the stairs. If you do not have them, ask someone to add them for you. What else can I do to help prevent falls?  Wear shoes that:  Do not have high heels.  Have rubber bottoms.  Are  comfortable and fit you well.  Are closed at the toe. Do not wear sandals.  If you use a stepladder:  Make sure that it is fully opened. Do not climb a closed stepladder.  Make sure that both sides of the stepladder are locked into place.  Ask someone to hold it for you, if possible.  Clearly mark and make sure that you can see:  Any grab bars or handrails.  First and last steps.  Where the edge of each step is.  Use tools that help you move around (mobility aids) if they are needed. These include:  Canes.  Walkers.  Scooters.  Crutches.  Turn on the lights when you go into a dark area. Replace any light bulbs as soon as they burn out.  Set up your furniture so you have a clear path. Avoid moving your furniture around.  If any of your floors are uneven, fix them.  If there are any pets around you, be aware of where they are.  Review your medicines with your doctor. Some medicines can make you feel dizzy. This can increase your chance of falling. Ask your doctor what other things that you can do to help prevent falls. This information is not intended to replace advice given to you by your health care provider. Make sure you discuss any questions you have with your health care provider. Document Released: 10/24/2008 Document Revised: 06/05/2015 Document Reviewed: 02/01/2014 Elsevier Interactive Patient Education  2017 [employer].

## 2017-11-28 ENCOUNTER — Encounter: Payer: Medicare HMO | Admitting: Unknown Physician Specialty

## 2017-11-28 ENCOUNTER — Ambulatory Visit (INDEPENDENT_AMBULATORY_CARE_PROVIDER_SITE_OTHER): Payer: Medicare HMO | Admitting: Family Medicine

## 2017-11-28 ENCOUNTER — Encounter: Payer: Self-pay | Admitting: Family Medicine

## 2017-11-28 VITALS — BP 112/68 | HR 67 | Temp 98.5°F | Ht 62.5 in | Wt 159.6 lb

## 2017-11-28 DIAGNOSIS — M810 Age-related osteoporosis without current pathological fracture: Secondary | ICD-10-CM

## 2017-11-28 DIAGNOSIS — Z1239 Encounter for other screening for malignant neoplasm of breast: Secondary | ICD-10-CM | POA: Diagnosis not present

## 2017-11-28 DIAGNOSIS — I10 Essential (primary) hypertension: Secondary | ICD-10-CM | POA: Diagnosis not present

## 2017-11-28 DIAGNOSIS — E039 Hypothyroidism, unspecified: Secondary | ICD-10-CM

## 2017-11-28 DIAGNOSIS — E119 Type 2 diabetes mellitus without complications: Secondary | ICD-10-CM

## 2017-11-28 DIAGNOSIS — Z0000 Encounter for general adult medical examination without abnormal findings: Secondary | ICD-10-CM

## 2017-11-28 DIAGNOSIS — E782 Mixed hyperlipidemia: Secondary | ICD-10-CM

## 2017-11-28 DIAGNOSIS — E1159 Type 2 diabetes mellitus with other circulatory complications: Secondary | ICD-10-CM | POA: Diagnosis not present

## 2017-11-28 LAB — UA/M W/RFLX CULTURE, ROUTINE
Bilirubin, UA: NEGATIVE
Glucose, UA: NEGATIVE
KETONES UA: NEGATIVE
NITRITE UA: NEGATIVE
Protein, UA: NEGATIVE
Specific Gravity, UA: 1.025 (ref 1.005–1.030)
Urobilinogen, Ur: 0.2 mg/dL (ref 0.2–1.0)
pH, UA: 5 (ref 5.0–7.5)

## 2017-11-28 LAB — MICROSCOPIC EXAMINATION: WBC, UA: NONE SEEN /hpf (ref 0–5)

## 2017-11-28 NOTE — Assessment & Plan Note (Signed)
Stable and WNL, continue current regimen, DASH diet, increase exercise

## 2017-11-28 NOTE — Assessment & Plan Note (Signed)
Stable, UtD on DXA, continue current regimen

## 2017-11-28 NOTE — Progress Notes (Signed)
BP 112/68 (BP Location: Left Arm, Patient Position: Sitting, Cuff Size: Normal)   Pulse 67   Temp 98.5 F (36.9 C)   Ht 5' 2.5" (1.588 m)   Wt 159 lb 9 oz (72.4 kg)   LMP  (LMP Unknown)   SpO2 100%   BMI 28.72 kg/m    Subjective:    Patient ID: Emma Berry, female    DOB: 08-28-1950, 67 y.o.   MRN: [id number]  HPI: Emma Berry is a 67 y.o. female presenting on 11/28/2017 for comprehensive medical examination. Current medical complaints include:none  Scheduling eye exam soon  Does not check BPs, states they're always normal. Taking all medicines faithfully wihtout side effects. Denies CP, SOB, dizziness, HAs, myalgias, claudication. No new concerns today.   Taking evista, calcium and vit d for osteoporosis management.   Checks sugars in morning, typically 90-110. Diet is going well, pt constantly working to improve it. Not exercising as much as before but getting back into walking. No low blood sugar spells.   She currently lives with: Menopausal Symptoms: no  Depression Screen done today and results listed below:  Depression screen [facility] 2/9 11/24/2017 11/23/2016 11/24/2015 11/18/2014  Decreased Interest 0 0 0 0  Down, Depressed, Hopeless 0 0 0 0  PHQ - 2 Score 0 0 0 0  Altered sleeping - 0 0 -  Tired, decreased energy - 0 0 -  Change in appetite - 0 0 -  Feeling bad or failure about yourself  - 0 0 -  Trouble concentrating - 0 0 -  Moving slowly or fidgety/restless - 0 0 -  Suicidal thoughts - 0 0 -  PHQ-9 Score - 0 0 -    The patient does not have a history of falls. I did not complete a risk assessment for falls. A plan of care for falls was not documented.   Past Medical History:  Past Medical History:  Diagnosis Date  . Chronic kidney disease   . Diabetes mellitus without complication (HCC)   . Hyperlipidemia   . Hypertension   . Hypothyroidism   . Osteopenia   . Osteoporosis   . Thyroid disease     Surgical History:  Past Surgical History:    Procedure Laterality Date  . CESAREAN SECTION    . COLONOSCOPY WITH PROPOFOL N/A 05/03/2017   Procedure: COLONOSCOPY WITH PROPOFOL;  Surgeon: [name], MD;  Location: [facility] ENDOSCOPY;  Service: Endoscopy;  Laterality: N/A;  . VENTRAL HERNIA REPAIR N/A 10/31/2017   Procedure: LAPAROSCOPIC VENTRAL HERNIA;  Surgeon: Carolan Shiver, MD;  Location: [facility] ORS;  Service: General;  Laterality: N/A;  . VENTRAL HERNIA REPAIR N/A 10/31/2017   Procedure: HERNIA REPAIR VENTRAL ADULT;  Surgeon: Carolan Shiver, MD;  Location: [facility] ORS;  Service: General;  Laterality: N/A;    Medications:  Current Outpatient Medications on File Prior to Visit  Medication Sig  . aspirin EC 81 MG tablet Take 81 mg by mouth at bedtime.  . calcium carbonate (CALCIUM 600) 600 MG TABS tablet Take 600 mg by mouth at bedtime.  . Cholecalciferol (VITAMIN D3) 5000 units CAPS Take 1 capsule by mouth daily.  . ferrous gluconate (FERGON) 225 (27 Fe) MG tablet Take 240 mg by mouth daily.  Marland Kitchen glyBURIDE (DIABETA) 5 MG tablet TAKE (1) TABLET TWICE A DAY WITH FOOD---BREAKFAST AND SUPPER. (Patient taking differently: Take 5 mg by mouth 2 (two) times daily. )  . metFORMIN (GLUCOPHAGE-XR) 500 MG 24 hr tablet TAKE 4 TABLET  BY MOUTH ONCE DAILY FOR DIABETES. TAKE WITH BREAKFAST. (Patient taking differently: Take 2,000 mg by mouth daily with breakfast. )  . ONE TOUCH ULTRA TEST test strip CHECK BLOOD SUGAR ONCE DAILY  . pioglitazone (ACTOS) 30 MG tablet TAKE (1) TABLET BY MOUTH DAILY FOR DIABETES (Patient taking differently: Take 30 mg by mouth daily. )  . pravastatin (PRAVACHOL) 40 MG tablet TAKE 1 TABLET BY MOUTH AT BEDTIME FOR CHOLESTEROL (Patient taking differently: Take 40 mg by mouth every evening. )  . quinapril (ACCUPRIL) 10 MG tablet TAKE 1 TABLET BY MOUTH ONCE DAILY. (Patient taking differently: Take 10 mg by mouth at bedtime. )  . raloxifene (EVISTA) 60 MG tablet TAKE 1 TABLET BY MOUTH ONCE DAILY.  . SYNTHROID 75 MCG  tablet TAKE 1 TABLET BY MOUTH ONCE DAILY BEFORE BREAKFAST.  Marland Kitchen VICTOZA 18 MG/3ML SOPN INJECT 1.8MG  S.Q. ONCE DAILY.  . vitamin B-12 (CYANOCOBALAMIN) 1000 MCG tablet Take 1,000 mcg by mouth at bedtime.   No current facility-administered medications on file prior to visit.     Allergies:  No Known Allergies  Social History:  Social History   Socioeconomic History  . Marital status: Married    Spouse name: Not on file  . Number of children: Not on file  . Years of education: Not on file  . Highest education level: Not on file  Occupational History  . Not on file  Social Needs  . Financial resource strain: Not hard at all  . Food insecurity:    Worry: Never true    Inability: Never true  . Transportation needs:    Medical: No    Non-medical: No  Tobacco Use  . Smoking status: Never Smoker  . Smokeless tobacco: Never Used  Substance and Sexual Activity  . Alcohol use: No    Alcohol/week: 0.0 standard drinks  . Drug use: No  . Sexual activity: Yes  Lifestyle  . Physical activity:    Days per week: 3 days    Minutes per session: 30 min  . Stress: Only a little  Relationships  . Social connections:    Talks on phone: More than three times a week    Gets together: More than three times a week    Attends religious service: More than 4 times per year    Active member of club or organization: No    Attends meetings of clubs or organizations: Never    Relationship status: Married  . Intimate partner violence:    Fear of current or ex partner: No    Emotionally abused: No    Physically abused: No    Forced sexual activity: No  Other Topics Concern  . Not on file  Social History Narrative  . Not on file   Social History   Tobacco Use  Smoking Status Never Smoker  Smokeless Tobacco Never Used   Social History   Substance and Sexual Activity  Alcohol Use No  . Alcohol/week: 0.0 standard drinks    Family History:  Family History  Problem Relation Age of Onset    . Cancer Mother 22       melanoma  . Diabetes Mother   . Thyroid disease Mother   . Cancer Father 28       prostate to bone  . Diabetes Daughter   . Heart disease Maternal Grandfather   . Stroke Paternal Grandmother   . Stroke Paternal Grandfather   . Breast cancer Neg Hx     Past medical  history, surgical history, medications, allergies, family history and social history reviewed with patient today and changes made to appropriate areas of the chart.   Review of Systems - General ROS: negative Psychological ROS: negative Ophthalmic ROS: negative ENT ROS: negative Allergy and Immunology ROS: negative Hematological and Lymphatic ROS: negative Endocrine ROS: negative Breast ROS: negative for breast lumps Respiratory ROS: no cough, shortness of breath, or wheezing Cardiovascular ROS: no chest pain or dyspnea on exertion Gastrointestinal ROS: no abdominal pain, change in bowel habits, or black or bloody stools Genito-Urinary ROS: no dysuria, trouble voiding, or hematuria Musculoskeletal ROS: negative Neurological ROS: no TIA or stroke symptoms Dermatological ROS: negative All other ROS negative except what is listed above and in the HPI.      Objective:    BP 112/68 (BP Location: Left Arm, Patient Position: Sitting, Cuff Size: Normal)   Pulse 67   Temp 98.5 F (36.9 C)   Ht 5' 2.5" (1.588 m)   Wt 159 lb 9 oz (72.4 kg)   LMP  (LMP Unknown)   SpO2 100%   BMI 28.72 kg/m   Wt Readings from Last 3 Encounters:  11/28/17 159 lb 9 oz (72.4 kg)  11/24/17 161 lb (73 kg)  10/31/17 163 lb (73.9 kg)    Physical Exam  Constitutional: She is oriented to person, place, and time. She appears well-developed and well-nourished. No distress.  HENT:  Head: Atraumatic.  Right Ear: External ear normal.  Left Ear: External ear normal.  Nose: Nose normal.  Mouth/Throat: Oropharynx is clear and moist. No oropharyngeal exudate.  Eyes: Pupils are equal, round, and reactive to light.  Conjunctivae are normal. No scleral icterus.  Neck: Normal range of motion. Neck supple. No thyromegaly present.  Cardiovascular: Normal rate, regular rhythm, normal heart sounds and intact distal pulses.  Pulmonary/Chest: Effort normal and breath sounds normal. No respiratory distress. Right breast exhibits no nipple discharge, no skin change and no tenderness. Left breast exhibits no nipple discharge, no skin change and no tenderness.  Dense homogenous breast tissue b/l medial breasts  Abdominal: Soft. Bowel sounds are normal. She exhibits no mass. There is no tenderness.  Musculoskeletal: Normal range of motion. She exhibits no edema or tenderness.  Lymphadenopathy:    She has no cervical adenopathy.  Neurological: She is alert and oriented to person, place, and time. No cranial nerve deficit.  Skin: Skin is warm and dry. No rash noted.  Psychiatric: She has a normal mood and affect. Her behavior is normal.  Nursing note and vitals reviewed.   Results for orders placed or performed during the hospital encounter of 10/31/17  Glucose, capillary  Result Value Ref Range   Glucose-Capillary 131 (H) 70 - 99 mg/dL  Glucose, capillary  Result Value Ref Range   Glucose-Capillary 159 (H) 70 - 99 mg/dL  Surgical pathology  Result Value Ref Range   SURGICAL PATHOLOGY      Surgical Pathology CASE: [id number] PATIENT: Lakeria Drummer Surgical Pathology Report     SPECIMEN SUBMITTED: A. Hernia sac, incisional, and contents  CLINICAL HISTORY: None provided  PRE-OPERATIVE DIAGNOSIS: Ventral/incisional hernia  POST-OPERATIVE DIAGNOSIS: Same as pre-op     DIAGNOSIS: A. VENTRAL HERNIA SAC AND CONTENTS; EXCISION: - SKIN WITH SUBCUTANEOUS ADIPOSE TISSUE AND DENSE FIBROUS TISSUE. - SEPARATE FRAGMENTS OF ADIPOSE AND CAUTERIZED FIBROVASCULAR TISSUE. - NEGATIVE FOR MALIGNANCY.  GROSS DESCRIPTION: A. Labeled: Incisional hernia sac and contents Received: In formalin Tissue fragment(s):  Multiple Size: Aggregate, 13.2 x 12.5 x 5.1 cm Description:  Yellow lobulated fibrofatty tissue with fibrous adhesions, some areas suggestive of a saclike area and one fragment having a 3.2 x 0.9 cm ellipse of wrinkled tan skin, specimen sectioned and no masses or nodules grossly identified Representative submitted in one cassette.    Final Diagnos is performed by Elijah Birk, MD.   Electronically signed 11/02/2017 1:24:28PM The electronic signature indicates that the named Attending Pathologist has evaluated the specimen  Technical component performed at [facility], [address] [place], [place] [address] Lab: [phone] Dir: Jolene Schimke, MD, MMM  Professional component performed at [facility], [address] [place], [place], [place] [address] Lab: [phone] Dir: Georgiann Cocker. Rubinas, MD       Assessment & Plan:   Problem List Items Addressed This Visit      Cardiovascular and Mediastinum   Hypertension associated with diabetes (HCC)    Stable and WNL, continue current regimen, DASH diet, increase exercise      Relevant Orders   CBC with Differential/Platelet     Endocrine   Controlled type 2 diabetes mellitus without complication, without long-term current use of insulin (HCC)    Stable, continue current regimen. Will recheck A1C and make adjustments if needed. Continue good lifestyle modifications. Pt to schedule eye exam      Relevant Orders   Comprehensive metabolic panel   UA/M w/rflx Culture, Routine   HgB A1c   Hypothyroidism   Relevant Orders   TSH     Musculoskeletal and Integument   Osteoporosis    Stable, UtD on DXA, continue current regimen        Other   Hyperlipidemia    Recheck lipids, adjust as needed. Continue current regimen      Relevant Orders   Lipid Panel w/o Chol/HDL Ratio    Other Visit Diagnoses    Annual physical exam    -  Primary   Screening for breast cancer       Relevant Orders   MM DIGITAL  SCREENING BILATERAL       Follow up plan: Return in about 6 months (around 05/29/2018) for 6 month f/u.   LABORATORY TESTING:  - Pap smear: not applicable  IMMUNIZATIONS:   - Tdap: Tetanus vaccination status reviewed: last tetanus booster within 10 years. - Influenza: Up to date - Pneumovax: Up to date - Prevnar: Up to date - HPV: Not applicable - Zostavax vaccine: Up to date  SCREENING: -Mammogram: Ordered today  - Colonoscopy: Up to date  - Bone Density: Up to date   PATIENT COUNSELING:   Advised to take 1 mg of folate supplement per day if capable of pregnancy.   Sexuality: Discussed sexually transmitted diseases, partner selection, use of condoms, avoidance of unintended pregnancy  and contraceptive alternatives.   Advised to avoid cigarette smoking.  I discussed with the patient that most people either abstain from alcohol or drink within safe limits (<=14/week and <=4 drinks/occasion for males, <=7/weeks and <= 3 drinks/occasion for females) and that the risk for alcohol disorders and other health effects rises proportionally with the number of drinks per week and how often a drinker exceeds daily limits.  Discussed cessation/primary prevention of drug use and availability of treatment for abuse.   Diet: Encouraged to adjust caloric intake to maintain  or achieve ideal body weight, to reduce intake of dietary saturated fat and total fat, to limit sodium intake by avoiding high sodium foods and not adding table salt, and to maintain adequate dietary potassium and calcium  preferably from fresh fruits, vegetables, and low-fat dairy products.    stressed the importance of regular exercise  Injury prevention: Discussed safety belts, safety helmets, smoke detector, smoking near bedding or upholstery.   Dental health: Discussed importance of regular tooth brushing, flossing, and dental visits.    NEXT PREVENTATIVE PHYSICAL DUE IN 1 YEAR. Return in about 6 months (around  05/29/2018) for 6 month f/u.

## 2017-11-28 NOTE — Assessment & Plan Note (Addendum)
Stable, continue current regimen. Will recheck A1C and make adjustments if needed. Continue good lifestyle modifications. Pt to schedule eye exam

## 2017-11-28 NOTE — Assessment & Plan Note (Signed)
Recheck lipids, adjust as needed. Continue current regimen 

## 2017-11-28 NOTE — Patient Instructions (Signed)
Follow up in 6 months 

## 2017-11-30 ENCOUNTER — Other Ambulatory Visit: Payer: Self-pay | Admitting: Physician Assistant

## 2017-11-30 ENCOUNTER — Encounter: Payer: Self-pay | Admitting: Family Medicine

## 2017-11-30 LAB — HEMOGLOBIN A1C
Est. average glucose Bld gHb Est-mCnc: 140 mg/dL
HEMOGLOBIN A1C: 6.5 % — AB (ref 4.8–5.6)

## 2017-11-30 LAB — COMPREHENSIVE METABOLIC PANEL
ALT: 11 IU/L (ref 0–32)
AST: 18 IU/L (ref 0–40)
Albumin/Globulin Ratio: 2.1 (ref 1.2–2.2)
Albumin: 4.6 g/dL (ref 3.6–4.8)
Alkaline Phosphatase: 74 IU/L (ref 39–117)
BUN/Creatinine Ratio: 23 (ref 12–28)
BUN: 19 mg/dL (ref 8–27)
Bilirubin Total: 0.4 mg/dL (ref 0.0–1.2)
CALCIUM: 9.3 mg/dL (ref 8.7–10.3)
CO2: 22 mmol/L (ref 20–29)
Chloride: 104 mmol/L (ref 96–106)
Creatinine, Ser: 0.84 mg/dL (ref 0.57–1.00)
GFR, EST AFRICAN AMERICAN: 82 mL/min/{1.73_m2} (ref >59)
GFR, EST NON AFRICAN AMERICAN: 71 mL/min/{1.73_m2} (ref >59)
Globulin, Total: 2.2 g/dL (ref 1.5–4.5)
Glucose: 81 mg/dL (ref 65–99)
Potassium: 4 mmol/L (ref 3.5–5.2)
Sodium: 139 mmol/L (ref 134–144)
TOTAL PROTEIN: 6.8 g/dL (ref 6.0–8.5)

## 2017-11-30 LAB — CBC WITH DIFFERENTIAL/PLATELET
BASOS: 1 %
Basophils Absolute: 0 10*3/uL (ref 0.0–0.2)
EOS (ABSOLUTE): 0.1 10*3/uL (ref 0.0–0.4)
EOS: 1 %
HEMOGLOBIN: 11.2 g/dL (ref 11.1–15.9)
Hematocrit: 34.6 % (ref 34.0–46.6)
Immature Grans (Abs): 0 10*3/uL (ref 0.0–0.1)
Immature Granulocytes: 1 %
LYMPHS ABS: 1.2 10*3/uL (ref 0.7–3.1)
Lymphs: 22 %
MCH: 33 pg (ref 26.6–33.0)
MCHC: 32.4 g/dL (ref 31.5–35.7)
MCV: 102 fL — AB (ref 79–97)
MONOS ABS: 0.6 10*3/uL (ref 0.1–0.9)
Monocytes: 10 %
NEUTROS ABS: 3.6 10*3/uL (ref 1.4–7.0)
Neutrophils: 65 %
PLATELETS: 280 10*3/uL (ref 150–450)
RBC: 3.39 x10E6/uL — ABNORMAL LOW (ref 3.77–5.28)
RDW: 12.5 % (ref 12.3–15.4)
WBC: 5.5 10*3/uL (ref 3.4–10.8)

## 2017-11-30 LAB — TSH: TSH: 1.76 u[IU]/mL (ref 0.450–4.500)

## 2017-11-30 LAB — LIPID PANEL W/O CHOL/HDL RATIO
Cholesterol, Total: 135 mg/dL (ref 100–199)
HDL: 55 mg/dL (ref >39)
LDL Calculated: 68 mg/dL (ref 0–99)
Triglycerides: 61 mg/dL (ref 0–149)
VLDL CHOLESTEROL CAL: 12 mg/dL (ref 5–40)

## 2017-12-14 ENCOUNTER — Ambulatory Visit: Payer: Medicare HMO

## 2017-12-19 ENCOUNTER — Other Ambulatory Visit: Payer: Self-pay | Admitting: Family Medicine

## 2017-12-19 ENCOUNTER — Encounter (INDEPENDENT_AMBULATORY_CARE_PROVIDER_SITE_OTHER): Payer: Self-pay

## 2017-12-19 ENCOUNTER — Ambulatory Visit
Admission: RE | Admit: 2017-12-19 | Discharge: 2017-12-19 | Disposition: A | Discharge status: Home / Self Care | Payer: Medicare HMO | Source: Ambulatory Visit | Attending: Family Medicine | Admitting: Family Medicine

## 2017-12-19 DIAGNOSIS — Z1239 Encounter for other screening for malignant neoplasm of breast: Secondary | ICD-10-CM

## 2017-12-19 DIAGNOSIS — Z1231 Encounter for screening mammogram for malignant neoplasm of breast: Secondary | ICD-10-CM | POA: Insufficient documentation

## 2018-01-02 ENCOUNTER — Encounter: Payer: Self-pay | Admitting: Nurse Practitioner

## 2018-01-02 ENCOUNTER — Other Ambulatory Visit: Payer: Self-pay

## 2018-01-02 ENCOUNTER — Ambulatory Visit (INDEPENDENT_AMBULATORY_CARE_PROVIDER_SITE_OTHER): Payer: Medicare HMO | Admitting: Nurse Practitioner

## 2018-01-02 VITALS — BP 99/61 | HR 92 | Temp 98.4°F | Ht 63.0 in | Wt 157.0 lb

## 2018-01-02 DIAGNOSIS — J069 Acute upper respiratory infection, unspecified: Secondary | ICD-10-CM | POA: Diagnosis not present

## 2018-01-02 DIAGNOSIS — J029 Acute pharyngitis, unspecified: Secondary | ICD-10-CM | POA: Diagnosis not present

## 2018-01-02 LAB — VERITOR FLU A/B WAIVED
Influenza A: NEGATIVE
Influenza B: NEGATIVE

## 2018-01-02 NOTE — Patient Instructions (Addendum)
Your symptoms and exam findings are most consistent with a viral upper respiratory infection. These usually run their course in 7 days. Unfortunately, antibiotics don't work against viruses and just increase your risk of other issues such as diarrhea, yeast infections, and resistant infections.  If you start feeling worse with facial pain, high fever, cough, shortness of breath or start feeling significantly worse, please call us right away to be further evaluated.  Some things that can make you feel better are: - Increased rest - Increasing Fluids - Acetaminophen / ibuprofen as needed for fever/pain.  - Salt water gargling, chloraseptic spray and throat lozenges - Diabetic Tussin OTC - Claritin 10 MG once a day for nasal drainage - Saline sinus flushes or a neti pot.  - Humidifying the air.   Upper Respiratory Infection, Adult An upper respiratory infection (URI) affects the nose, throat, and upper air passages. URIs are caused by germs (viruses). The most common type of URI is often called "the common cold." Medicines cannot cure URIs, but you can do things at home to relieve your symptoms. URIs usually get better within 7-10 days. Follow these instructions at home: Activity  Rest as needed.  If you have a fever, stay home from work or school until your fever is gone, or until your doctor says you may return to work or school. ? You should stay home until you cannot spread the infection anymore (you are not contagious). ? Your doctor may have you wear a face mask so you have less risk of spreading the infection. Relieving symptoms  Gargle with a salt-water mixture 3-4 times a day or as needed. To make a salt-water mixture, completely dissolve -1 tsp of salt in 1 cup of warm water.  Use a cool-mist humidifier to add moisture to the air. This can help you breathe more easily. Eating and drinking   Drink enough fluid to keep your pee (urine) pale yellow.  Eat soups and other clear  broths. General instructions   Take over-the-counter and prescription medicines only as told by your doctor. These include cold medicines, fever reducers, and cough suppressants.  Do not use any products that contain nicotine or tobacco. These include cigarettes and e-cigarettes. If you need help quitting, ask your doctor.  Avoid being where people are smoking (avoid secondhand smoke).  Make sure you get regular shots and get the flu shot every year.  Keep all follow-up visits as told by your doctor. This is important. How to avoid spreading infection to others   Wash your hands often with soap and water. If you do not have soap and water, use hand sanitizer.  Avoid touching your mouth, face, eyes, or nose.  Cough or sneeze into a tissue or your sleeve or elbow. Do not cough or sneeze into your hand or into the air. Contact a doctor if:  You are getting worse, not better.  You have any of these: ? A fever. ? Chills. ? Brown or red mucus in your nose. ? Yellow or brown fluid (discharge)coming from your nose. ? Pain in your face, especially when you bend forward. ? Swollen neck glands. ? Pain with swallowing. ? White areas in the back of your throat. Get help right away if:  You have shortness of breath that gets worse.  You have very bad or constant: ? Headache. ? Ear pain. ? Pain in your forehead, behind your eyes, and over your cheekbones (sinus pain). ? Chest pain.  You have long-lasting (chronic)  lung disease along with any of these: ? Wheezing. ? Long-lasting cough. ? Coughing up blood. ? A change in your usual mucus.  You have a stiff neck.  You have changes in your: ? Vision. ? Hearing. ? Thinking. ? Mood. Summary  An upper respiratory infection (URI) is caused by a germ called a virus. The most common type of URI is often called "the common cold."  URIs usually get better within 7-10 days.  Take over-the-counter and prescription medicines only as  told by your doctor. This information is not intended to replace advice given to you by your health care provider. Make sure you discuss any questions you have with your health care provider. Document Released: 06/16/2007 Document Revised: 08/20/2016 Document Reviewed: 08/20/2016 Elsevier Interactive Patient Education  2019 [employer]

## 2018-01-02 NOTE — Progress Notes (Signed)
BP 99/61   Pulse 92   Temp 98.4 F (36.9 C) (Oral)   Ht 5\' 3"  (1.6 m)   Wt 157 lb (71.2 kg)   LMP  (LMP Unknown)   SpO2 100%   BMI 27.81 kg/m    Subjective:    Patient ID: Emma Berry, female    DOB: 1950/12/19, 67 y.o.   MRN: [id number]  HPI: Emma Berry is a 67 y.o. female presents for acute visit  Chief Complaint  Patient presents with  . Sore Throat    x about 4 days  . Nasal Congestion  . Cough  . Fever    since friday night on and off/ gets worse at the evenings   UPPER RESPIRATORY TRACT INFECTION Started 3 days ago, Friday night.  Started with chills and fever at night.  On Saturday cough started, along with hoarseness.   Worst symptom: cough/sore throat Fever: yes, TMAX 99.6 at night Cough: yes Shortness of breath: no Wheezing: no Chest pain: no Chest tightness: no Chest congestion: no Nasal congestion: yes Runny nose: yes Post nasal drip: yes Sneezing: no Sore throat: yes Swollen glands: no Sinus pressure: no Headache: no Face pain: no Toothache: no Ear pain: none Ear pressure: none Eyes red/itching: none Eye drainage/crusting: none Vomiting: no Rash: no Fatigue: yes Sick contacts: yes, grandchildren with colds Strep contacts: no  Context: fluctuating Recurrent sinusitis: no Relief with OTC cold/cough medications: no  Treatments attempted: Tylenol for fever   Relevant past medical, surgical, family and social history reviewed and updated as indicated. Interim medical history since our last visit reviewed. Allergies and medications reviewed and updated.  Review of Systems  Constitutional: Positive for fatigue and fever (at night). Negative for activity change, appetite change and diaphoresis.  HENT: Positive for congestion, postnasal drip, rhinorrhea and sore throat. Negative for drooling, ear discharge, ear pain, sinus pressure, sinus pain, sneezing and voice change (hoarseness).   Eyes: Negative for itching and visual disturbance.    Respiratory: Positive for cough. Negative for chest tightness, shortness of breath and wheezing.   Cardiovascular: Negative for chest pain, palpitations and leg swelling.  Gastrointestinal: Negative for abdominal distention, abdominal pain, constipation, diarrhea, nausea and vomiting.  Endocrine: Negative.   Neurological: Negative for dizziness, syncope, weakness, light-headedness, numbness and headaches.  Psychiatric/Behavioral: Negative.     Per HPI unless specifically indicated above     Objective:    BP 99/61   Pulse 92   Temp 98.4 F (36.9 C) (Oral)   Ht 5\' 3"  (1.6 m)   Wt 157 lb (71.2 kg)   LMP  (LMP Unknown)   SpO2 100%   BMI 27.81 kg/m   Wt Readings from Last 3 Encounters:  01/02/18 157 lb (71.2 kg)  11/28/17 159 lb 9 oz (72.4 kg)  11/24/17 161 lb (73 kg)    Physical Exam Vitals signs and nursing note reviewed.  Constitutional:      General: She is awake.     Appearance: She is well-developed.  HENT:     Head: Normocephalic. No raccoon eyes.     Salivary Glands: Right salivary gland is not diffusely enlarged. Left salivary gland is not diffusely enlarged.     Right Ear: Hearing, tympanic membrane, ear canal and external ear normal.     Left Ear: Hearing, tympanic membrane, ear canal and external ear normal.     Nose: Mucosal edema and rhinorrhea present. Rhinorrhea is clear.     Right Turbinates: Not swollen.  Left Turbinates: Not swollen.     Right Sinus: No maxillary sinus tenderness or frontal sinus tenderness.     Left Sinus: No maxillary sinus tenderness or frontal sinus tenderness.     Mouth/Throat:     Mouth: Mucous membranes are moist.     Pharynx: Posterior oropharyngeal erythema (mild with cobblestoning) present. No pharyngeal swelling or oropharyngeal exudate.  Eyes:     General: Lids are normal.        Right eye: No discharge.        Left eye: No discharge.     Conjunctiva/sclera: Conjunctivae normal.     Pupils: Pupils are equal, round, and  reactive to light.  Neck:     Musculoskeletal: Normal range of motion and neck supple.     Thyroid: No thyromegaly.     Vascular: No carotid bruit or JVD.  Cardiovascular:     Rate and Rhythm: Normal rate and regular rhythm.     Heart sounds: Normal heart sounds.  Pulmonary:     Effort: Pulmonary effort is normal.     Breath sounds: Normal breath sounds.     Comments: Clear throughout all lung fields.  Intermittent dry cough noted.  Hoarseness present. Abdominal:     General: Bowel sounds are normal.     Palpations: Abdomen is soft.  Lymphadenopathy:     Head:     Right side of head: No submental, submandibular or tonsillar adenopathy.     Left side of head: No submental, submandibular or tonsillar adenopathy.     Cervical: No cervical adenopathy.  Skin:    General: Skin is warm and dry.  Neurological:     Mental Status: She is alert and oriented to person, place, and time.  Psychiatric:        Mood and Affect: Mood normal.        Behavior: Behavior normal. Behavior is cooperative.        Thought Content: Thought content normal.        Judgment: Judgment normal.     Results for orders placed or performed in visit on 11/28/17  Microscopic Examination  Result Value Ref Range   WBC, UA None seen 0 - 5 /hpf   RBC, UA 0-2 0 - 2 /hpf   Epithelial Cells (non renal) 0-10 0 - 10 /hpf   Bacteria, UA Few (A) None seen/Few  CBC with Differential/Platelet  Result Value Ref Range   WBC 5.5 3.4 - 10.8 x10E3/uL   RBC 3.39 (L) 3.77 - 5.28 x10E6/uL   Hemoglobin 11.2 11.1 - 15.9 g/dL   Hematocrit 13.0 86.5 - 46.6 %   MCV 102 (H) 79 - 97 fL   MCH 33.0 26.6 - 33.0 pg   MCHC 32.4 31.5 - 35.7 g/dL   RDW 78.4 69.6 - 29.5 %   Platelets 280 150 - 450 x10E3/uL   Neutrophils 65 Not Estab. %   Lymphs 22 Not Estab. %   Monocytes 10 Not Estab. %   Eos 1 Not Estab. %   Basos 1 Not Estab. %   Neutrophils Absolute 3.6 1.4 - 7.0 x10E3/uL   Lymphocytes Absolute 1.2 0.7 - 3.1 x10E3/uL   Monocytes  Absolute 0.6 0.1 - 0.9 x10E3/uL   EOS (ABSOLUTE) 0.1 0.0 - 0.4 x10E3/uL   Basophils Absolute 0.0 0.0 - 0.2 x10E3/uL   Immature Granulocytes 1 Not Estab. %   Immature Grans (Abs) 0.0 0.0 - 0.1 x10E3/uL   Hematology Comments: Note:   Comprehensive metabolic panel  Result Value Ref Range   Glucose 81 65 - 99 mg/dL   BUN 19 8 - 27 mg/dL   Creatinine, Ser 1.610.84 0.57 - 1.00 mg/dL   GFR calc non Af Amer 71 >59 mL/min/1.73   GFR calc Af Amer 82 >59 mL/min/1.73   BUN/Creatinine Ratio 23 12 - 28   Sodium 139 134 - 144 mmol/L   Potassium 4.0 3.5 - 5.2 mmol/L   Chloride 104 96 - 106 mmol/L   CO2 22 20 - 29 mmol/L   Calcium 9.3 8.7 - 10.3 mg/dL   Total Protein 6.8 6.0 - 8.5 g/dL   Albumin 4.6 3.6 - 4.8 g/dL   Globulin, Total 2.2 1.5 - 4.5 g/dL   Albumin/Globulin Ratio 2.1 1.2 - 2.2   Bilirubin Total 0.4 0.0 - 1.2 mg/dL   Alkaline Phosphatase 74 39 - 117 IU/L   AST 18 0 - 40 IU/L   ALT 11 0 - 32 IU/L  Lipid Panel w/o Chol/HDL Ratio  Result Value Ref Range   Cholesterol, Total 135 100 - 199 mg/dL   Triglycerides 61 0 - 149 mg/dL   HDL 55 >09>39 mg/dL   VLDL Cholesterol Cal 12 5 - 40 mg/dL   LDL Calculated 68 0 - 99 mg/dL  TSH  Result Value Ref Range   TSH 1.760 0.450 - 4.500 uIU/mL  UA/M w/rflx Culture, Routine  Result Value Ref Range   Specific Gravity, UA 1.025 1.005 - 1.030   pH, UA 5.0 5.0 - 7.5   Color, UA Yellow Yellow   Appearance Ur Cloudy (A) Clear   Leukocytes, UA Trace (A) Negative   Protein, UA Negative Negative/Trace   Glucose, UA Negative Negative   Ketones, UA Negative Negative   RBC, UA Trace (A) Negative   Bilirubin, UA Negative Negative   Urobilinogen, Ur 0.2 0.2 - 1.0 mg/dL   Nitrite, UA Negative Negative   Microscopic Examination See below:   HgB A1c  Result Value Ref Range   Hgb A1c MFr Bld 6.5 (H) 4.8 - 5.6 %   Est. average glucose Bld gHb Est-mCnc 140 mg/dL      Assessment & Plan:   Problem List Items Addressed This Visit      Respiratory   Viral  upper respiratory tract infection - Primary    Acute, viral in appearance.  Neg flu and strep.  No abx at this time, discussed abx stewardship with patient.  Salt water gargle for sore throat.  Diabetic Tussin OTC for cough and Claritin 10 MG daily for nasal drainage.  Increase fluid intake.  Humidifier in room.  Return in worsening or continued symptoms.      Relevant Orders   Rapid Strep Screen ([facility] ONLY)   Veritor Flu A/B Waived       Follow up plan: Return if symptoms worsen or fail to improve.

## 2018-01-02 NOTE — Assessment & Plan Note (Signed)
Acute, viral in appearance.  Neg flu and strep.  No abx at this time, discussed abx stewardship with patient.  Salt water gargle for sore throat.  Diabetic Tussin OTC for cough and Claritin 10 MG daily for nasal drainage.  Increase fluid intake.  Humidifier in room.  Return in worsening or continued symptoms.

## 2018-01-05 LAB — RAPID STREP SCREEN (MED CTR MEBANE ONLY): STREP GP A AG, IA W/REFLEX: NEGATIVE

## 2018-01-05 LAB — CULTURE, GROUP A STREP: Strep A Culture: NEGATIVE

## 2018-01-07 ENCOUNTER — Ambulatory Visit
Admission: EM | Admit: 2018-01-07 | Discharge: 2018-01-07 | Disposition: A | Discharge status: Home / Self Care | Payer: Medicare HMO | Attending: Family Medicine | Admitting: Family Medicine

## 2018-01-07 ENCOUNTER — Other Ambulatory Visit: Payer: Self-pay

## 2018-01-07 ENCOUNTER — Encounter: Payer: Self-pay | Admitting: Emergency Medicine

## 2018-01-07 DIAGNOSIS — J988 Other specified respiratory disorders: Secondary | ICD-10-CM | POA: Diagnosis not present

## 2018-01-07 MED ORDER — DOXYCYCLINE HYCLATE 100 MG PO CAPS: 100.0000 mg | ORAL_CAPSULE | Freq: Two times a day (BID) | ORAL | 0 refills | Status: DC

## 2018-01-07 NOTE — ED Provider Notes (Signed)
[facility]    CSN: [id number] Arrival date & time: 01/07/18  1513  History   Chief Complaint Chief Complaint  Patient presents with  . Nasal Congestion  . Fever   HPI  67 year old female presents with the above complaints.  Patient was recently seen by her primary on 12/23.  Patient states that she was instructed that this was a viral illness.  Flu negative.  Strep negative.  Patient states that since seeing her primary she continues to worsen.  She reports productive cough.  Sputum is brown.  She reports sinus pressure and congestion and associated sore throat.  She states that she has been running fever, 100-101.  States that it improves with Tylenol but then returns when she is not taking Tylenol.  Patient states that she was told to follow-up if she fails to improve or worsened.  Patient is adamant that she has a sinus infection.  Patient believes that she needs antibiotics.  No other associated symptoms.  No other complaints.  PMH, Surgical Hx, Family Hx, Social History reviewed and updated as below.  Past Medical History:  Diagnosis Date  . Chronic kidney disease   . Diabetes mellitus without complication (HCC)   . Hyperlipidemia   . Hypertension   . Hypothyroidism   . Osteopenia   . Osteoporosis   . Thyroid disease     Patient Active Problem List   Diagnosis Date Noted  . Viral upper respiratory tract infection 01/02/2018    Class: Acute  . Advanced care planning/counseling discussion 11/23/2016  . Neck pain 08/23/2016  . Osteopenia 11/15/2014  . Controlled type 2 diabetes mellitus without complication, without long-term current use of insulin (HCC) 11/15/2014  . Hypertension associated with diabetes (HCC) 11/15/2014  . Hyperlipidemia 11/15/2014  . Hypothyroidism 11/15/2014  . Osteoporosis 11/15/2014    Past Surgical History:  Procedure Laterality Date  . CESAREAN SECTION    . COLONOSCOPY WITH PROPOFOL N/A 05/03/2017   Procedure: COLONOSCOPY WITH  PROPOFOL;  Surgeon: Christena DeemSkulskie, Martin U, MD;  [facility] END[facility]COPY;  Service: Endoscopy;  Laterality: N/A;  . VENTRAL HERNIA REPAIR N/A 10/31/2017   Procedure: LAPAROSCOPIC VENTRAL HERNIA;  Surgeon: Carolan Shiverintron-Diaz, Edgardo, MD;  Location: [facility] ORS;  Service: General;  Laterality: N/A;  . VENTRAL HERNIA REPAIR N/A 10/31/2017   Procedure: HERNIA REPAIR VENTRAL ADULT;  Surgeon: Carolan Shiverintron-Diaz, Edgardo, MD;  Location: [facility] ORS;  Service: General;  Laterality: N/A;    OB History   No obstetric history on file.      Home Medications    Prior to Admission medications   Medication Sig Start Date End Date Taking? Authorizing Provider  aspirin EC 81 MG tablet Take 81 mg by mouth at bedtime.   Yes [provider]  calcium carbonate (CALCIUM 600) 600 MG TABS tablet Take 600 mg by mouth at bedtime.   Yes [provider]  Cholecalciferol (VITAMIN D3) 5000 units CAPS Take 1 capsule by mouth daily.   Yes [provider]  ferrous gluconate (FERGON) 225 (27 Fe) MG tablet Take 240 mg by mouth daily.   Yes [provider]  glyBURIDE (DIABETA) 5 MG tablet TAKE (1) TABLET TWICE A DAY WITH FOOD---BREAKFAST AND SUPPER. 11/30/17  Yes Johnson, Megan P, DO  metFORMIN (GLUCOPHAGE-XR) 500 MG 24 hr tablet TAKE 4 TABLET BY MOUTH ONCE DAILY FOR DIABETES. TAKE WITH BREAKFAST. Patient taking differently: Take 2,000 mg by mouth daily with breakfast.  08/30/17  Yes Particia NearingLane, Rachel Elizabeth, PA-C  ONE TOUCH ULTRA TEST test  strip CHECK BLOOD SUGAR ONCE DAILY 03/02/16  Yes Gabriel CirriWicker, Cheryl, NP  pioglitazone (ACTOS) 30 MG tablet TAKE (1) TABLET BY MOUTH DAILY FOR DIABETES 11/30/17  Yes Johnson, Megan P, DO  pravastatin (PRAVACHOL) 40 MG tablet TAKE 1 TABLET BY MOUTH AT BEDTIME FOR CHOLESTEROL 11/30/17  Yes Johnson, Megan P, DO  quinapril (ACCUPRIL) 10 MG tablet TAKE 1 TABLET BY MOUTH ONCE DAILY. 11/30/17  Yes Johnson, Megan P, DO  raloxifene (EVISTA) 60 MG tablet TAKE 1 TABLET BY MOUTH ONCE DAILY.  11/17/17  Yes Gabriel CirriWicker, Cheryl, NP  SYNTHROID 75 MCG tablet TAKE 1 TABLET BY MOUTH ONCE DAILY BEFORE BREAKFAST. 11/17/17  Yes Particia NearingLane, Rachel Elizabeth, PA-C  VICTOZA 18 MG/3ML SOPN INJECT 1.8MG  S.Q. ONCE DAILY. 11/09/17  Yes Particia NearingLane, Rachel Elizabeth, PA-C  vitamin B-12 (CYANOCOBALAMIN) 1000 MCG tablet Take 1,000 mcg by mouth at bedtime.   Yes [provider]  doxycycline (VIBRAMYCIN) 100 MG capsule Take 1 capsule (100 mg total) by mouth 2 (two) times daily. 01/07/18   Tommie Samsook, Kealy Lewter G, DO    Family History Family History  Problem Relation Age of Onset  . Cancer Mother 1875       melanoma  . Diabetes Mother   . Thyroid disease Mother   . Cancer Father 6070       prostate to bone  . Diabetes Daughter   . Heart disease Maternal Grandfather   . Stroke Paternal Grandmother   . Stroke Paternal Grandfather   . Breast cancer Neg Hx     Social History Social History   Tobacco Use  . Smoking status: Never Smoker  . Smokeless tobacco: Never Used  Substance Use Topics  . Alcohol use: No    Alcohol/week: 0.0 standard drinks  . Drug use: No     Allergies   Patient has no known allergies.   Review of Systems Review of Systems  Constitutional: Positive for fever.  HENT: Positive for sinus pressure, sinus pain and sore throat.   Respiratory: Positive for cough.    Physical Exam Triage Vital Signs ED Triage Vitals  Enc Vitals Group     BP 01/07/18 1623 117/66     Pulse Rate 01/07/18 1623 96     Resp 01/07/18 1623 18     Temp 01/07/18 1623 98.2 F (36.8 C)     Temp Source 01/07/18 1623 Oral     SpO2 01/07/18 1623 96 %     Weight 01/07/18 1620 157 lb (71.2 kg)     Height 01/07/18 1620 5\' 3"  (1.6 m)     Head Circumference --      Peak Flow --      Pain Score 01/07/18 1620 2     Pain Loc --      Pain Edu? --      Excl. in GC? --    Updated Vital Signs BP 117/66 (BP Location: Left Arm)   Pulse 96   Temp 98.2 F (36.8 C) (Oral)   Resp 18   Ht 5\' 3"  (1.6 m)   Wt 71.2 kg    LMP  (LMP Unknown)   SpO2 96%   BMI 27.81 kg/m   Visual Acuity Right Eye Distance:   Left Eye Distance:   Bilateral Distance:    Right Eye Near:   Left Eye Near:    Bilateral Near:     Physical Exam Vitals signs and nursing note reviewed.  Constitutional:      General: She is not in acute distress. HENT:  Head: Normocephalic and atraumatic.     Right Ear: Tympanic membrane normal.     Left Ear: Tympanic membrane normal.     Nose:     Comments: Patient endorsing frontal sinus pain.    Mouth/Throat:     Comments: Oropharynx with mild erythema.  Eyes:     General:        Right eye: No discharge.        Left eye: No discharge.     Conjunctiva/sclera: Conjunctivae normal.  Cardiovascular:     Rate and Rhythm: Normal rate and regular rhythm.  Pulmonary:     Effort: Pulmonary effort is normal.     Breath sounds: No wheezing, rhonchi or rales.  Neurological:     Mental Status: She is alert.  Psychiatric:        Mood and Affect: Mood normal.        Behavior: Behavior normal.    UC Treatments / Results  Labs (all labs ordered are listed, but only abnormal results are displayed) Labs Reviewed - No data to display  EKG None  Radiology No results found.  Procedures Procedures (including critical care time)  Medications Ordered in UC Medications - No data to display  Initial Impression / Assessment and Plan / UC Course  I have reviewed the triage vital signs and the nursing notes.  Pertinent labs & imaging results that were available during my care of the patient were reviewed by me and considered in my medical decision making (see chart for details).    67 year old female presents with a respiratory infection.  Given persistent and worsening symptoms as well as continued fever, I am starting the patient on Doxycycline.  Final Clinical Impressions(s) / UC Diagnoses   Final diagnoses:  Respiratory infection     Discharge Instructions     Antibiotic as  prescribed.  Please follow-up with your primary care physician.   ED Prescriptions    Medication Sig Dispense Auth. Provider   doxycycline (VIBRAMYCIN) 100 MG capsule Take 1 capsule (100 mg total) by mouth 2 (two) times daily. 14 capsule Tommie Sams, DO     Controlled Substance Prescriptions [place] Controlled Substance Registry consulted? Not Applicable   Tommie Sams, DO 01/07/18 1819

## 2018-01-07 NOTE — ED Triage Notes (Signed)
Pt c/o congestion, sore throat, fever. She saw her PCP 5 days ago and told it was viral but her fever continues to be 100-101. Her PCP did a flu and strep test and was negative.

## 2018-01-07 NOTE — Discharge Instructions (Signed)
Antibiotic as prescribed.  Please follow-up with your primary care physician.

## 2018-02-04 ENCOUNTER — Other Ambulatory Visit: Payer: Self-pay | Admitting: Family Medicine

## 2018-02-06 NOTE — Telephone Encounter (Signed)
[employer] called and spoke to Emma Berry, [facility] about the refill request, she says they have the one sent on 11/30/17 #90/1 refill. Refill request refused.

## 2018-02-12 ENCOUNTER — Other Ambulatory Visit: Payer: Self-pay | Admitting: Unknown Physician Specialty

## 2018-02-12 ENCOUNTER — Other Ambulatory Visit: Payer: Self-pay | Admitting: Family Medicine

## 2018-03-06 ENCOUNTER — Other Ambulatory Visit: Payer: Self-pay | Admitting: Family Medicine

## 2018-03-06 DIAGNOSIS — H5213 Myopia, bilateral: Secondary | ICD-10-CM | POA: Diagnosis not present

## 2018-03-06 LAB — HM DIABETES EYE EXAM

## 2018-03-07 NOTE — Telephone Encounter (Signed)
Requested Prescriptions  Pending Prescriptions Disp Refills  . metFORMIN (GLUCOPHAGE-XR) 500 MG 24 hr tablet [Pharmacy Med Name: METFORMIN HCL ER 500 MG TAB] 360 tablet 0    Sig: TAKE 4 TABLET BY MOUTH ONCE DAILY FOR DIABETES. TAKE WITH BREAKFAST.     Endocrinology:  Diabetes - Biguanides Passed - 03/06/2018  8:49 PM      Passed - Cr in normal range and within 360 days    Creatinine  Date Value Ref Range Status  02/23/2011 0.88 0.60 - 1.30 mg/dL Final   Creatinine, Ser  Date Value Ref Range Status  11/28/2017 0.84 0.57 - 1.00 mg/dL Final         Passed - HBA1C is between 0 and 7.9 and within 180 days    Hemoglobin A1C  Date Value Ref Range Status  11/24/2015 7.2%  Final   HB A1C (BAYER DCA - WAIVED)  Date Value Ref Range Status  03/09/2017 6.5 <7.0 % Final    Comment:                                          Diabetic Adult            <7.0                                       Healthy Adult        4.3 - 5.7                                                           (DCCT/NGSP) American Diabetes Association's Summary of Glycemic Recommendations for Adults with Diabetes: Hemoglobin A1c <7.0%. More stringent glycemic goals (A1c <6.0%) may further reduce complications at the cost of increased risk of hypoglycemia.    Hgb A1c MFr Bld  Date Value Ref Range Status  11/28/2017 6.5 (H) 4.8 - 5.6 % Final    Comment:             Prediabetes: 5.7 - 6.4          Diabetes: >6.4          Glycemic control for adults with diabetes: <7.0          Passed - eGFR in normal range and within 360 days    EGFR (African American)  Date Value Ref Range Status  02/23/2011 >60 >70m/min Final   GFR calc Af Amer  Date Value Ref Range Status  11/28/2017 82 >59 mL/min/1.73 Final   EGFR (Non-African Amer.)  Date Value Ref Range Status  02/23/2011 >60 >676mmin Final    Comment:    eGFR values <6027min/1.73 m2 may be an indication of chronic kidney disease (CKD). Calculated eGFR, using the  MRDR Study equation, is useful in  patients with stable renal function. The eGFR calculation will not be reliable in acutely ill patients when serum creatinine is changing rapidly. It is not useful in patients on dialysis. The eGFR calculation may not be applicable to patients at the low and high extremes of body sizes, pregnant women, and vegetarians.    GFR calc non Af AmeWyvonnia Loraate Value  Ref Range Status  11/28/2017 71 >59 mL/min/1.73 Final         Passed - Valid encounter within last 6 months    Recent Outpatient Visits          2 months ago Viral upper respiratory tract infection   [place], Barbaraann Faster, NP   3 months ago Annual physical exam   [place], [place]   7 months ago [place], Rachel [place], [place]   12 months ago Type 2 diabetes mellitus with stage 1 chronic kidney disease, without long-term current use of insulin ([place])   [place] Kathrine Haddock, NP   1 year ago Need for influenza vaccination   [facility] Kathrine Haddock, NP      Future Appointments            In 2 months Orene Desanctis, Lilia Argue, [place], [place]   In 57 months  [employer], [place]

## 2018-03-27 ENCOUNTER — Other Ambulatory Visit: Payer: Self-pay | Admitting: Family Medicine

## 2018-05-08 ENCOUNTER — Other Ambulatory Visit: Payer: Self-pay | Admitting: Family Medicine

## 2018-05-30 ENCOUNTER — Other Ambulatory Visit: Payer: Self-pay | Admitting: Family Medicine

## 2018-05-30 ENCOUNTER — Other Ambulatory Visit: Payer: Self-pay

## 2018-05-30 ENCOUNTER — Encounter: Payer: Self-pay | Admitting: Family Medicine

## 2018-05-30 ENCOUNTER — Ambulatory Visit (INDEPENDENT_AMBULATORY_CARE_PROVIDER_SITE_OTHER): Payer: Medicare HMO | Admitting: Family Medicine

## 2018-05-30 VITALS — BP 115/60 | HR 72 | Temp 98.4°F | Ht 63.0 in | Wt 156.0 lb

## 2018-05-30 DIAGNOSIS — I10 Essential (primary) hypertension: Secondary | ICD-10-CM

## 2018-05-30 DIAGNOSIS — M810 Age-related osteoporosis without current pathological fracture: Secondary | ICD-10-CM | POA: Diagnosis not present

## 2018-05-30 DIAGNOSIS — E119 Type 2 diabetes mellitus without complications: Secondary | ICD-10-CM

## 2018-05-30 DIAGNOSIS — E1159 Type 2 diabetes mellitus with other circulatory complications: Secondary | ICD-10-CM

## 2018-05-30 DIAGNOSIS — E782 Mixed hyperlipidemia: Secondary | ICD-10-CM

## 2018-05-30 DIAGNOSIS — E039 Hypothyroidism, unspecified: Secondary | ICD-10-CM | POA: Diagnosis not present

## 2018-05-30 MED ORDER — QUINAPRIL HCL 10 MG PO TABS: 10.0000 mg | ORAL_TABLET | Freq: Every day | ORAL | 1 refills | Status: DC

## 2018-05-30 MED ORDER — LEVOTHYROXINE SODIUM 75 MCG PO TABS: ORAL_TABLET | ORAL | 1 refills | Status: DC

## 2018-05-30 MED ORDER — METFORMIN HCL ER 500 MG PO TB24: ORAL_TABLET | ORAL | 1 refills | Status: DC

## 2018-05-30 MED ORDER — GLYBURIDE 5 MG PO TABS: ORAL_TABLET | ORAL | 1 refills | Status: DC

## 2018-05-30 MED ORDER — LIRAGLUTIDE 18 MG/3ML ~~LOC~~ SOPN: PEN_INJECTOR | SUBCUTANEOUS | 6 refills | Status: DC

## 2018-05-30 MED ORDER — PRAVASTATIN SODIUM 40 MG PO TABS: ORAL_TABLET | ORAL | 1 refills | Status: DC

## 2018-05-30 MED ORDER — PIOGLITAZONE HCL 30 MG PO TABS: ORAL_TABLET | ORAL | 1 refills | Status: DC

## 2018-05-30 NOTE — Assessment & Plan Note (Signed)
Stable and WNL, continue current regimen 

## 2018-05-30 NOTE — Assessment & Plan Note (Signed)
Recheck A1C, adjust as needed. Continue current medications and good diet and exercise

## 2018-05-30 NOTE — Assessment & Plan Note (Signed)
Recheck lipids, adjust as needed. Continue good lifestyle modifications 

## 2018-05-30 NOTE — Progress Notes (Signed)
BP 115/60 (BP Location: Right Arm, Patient Position: Sitting, Cuff Size: Normal)   Pulse 72   Temp 98.4 F (36.9 C) (Oral)   Ht 5\' 3"  (1.6 m)   Wt 156 lb (70.8 kg)   LMP  (LMP Unknown)   BMI 27.63 kg/m    Subjective:    Patient ID: Emma Berry, female    DOB: 1950/11/09, 68 y.o.   MRN: [id number]  HPI: Emma Berry is a 68 y.o. female  Chief Complaint  Patient presents with  . Diabetes    6 month F/U. Patient states no complaints.  . Hyperlipidemia  . Hypertension  . Osteoarthritis  . Hypothyroidism    . This visit was completed via telephone due to the restrictions of the COVID-19 pandemic. All issues as above were discussed and addressed but no physical exam was performed. If it was felt that the patient should be evaluated in the office, they were directed there. The patient verbally consented to this visit. Patient was unable to complete an audio/visual visit due to Technical difficulties,Lack of internet. Due to the catastrophic nature of the COVID-19 pandemic, this visit was done through audio contact only. . Location of the patient: home . Location of the provider: home . Those involved with this call:  . Provider: Roosvelt Maser, PA-C . CMA: Myrtha Mantis, CMA . Front Desk/Registration: Harriet Pho  . Time spent on call: 25 minutes on the phone discussing health concerns. 5 minutes total spent in review of patient's record and preparation of their chart. I verified patient identity using two factors (patient name and date of birth). Patient consents verbally to being seen via telemedicine visit today.   Here today for 6 month f/u. Taking medicines faithfully without side effects. Denies CP, SOB, HAs, dizziness, low blood sugar spells.   Home BPs around 110-120/60 when checking at home. Taking quinapril without issue.   Home BSs between 80-100 fasting when checked. No low blood sugar spells. Taking actos, metformin, victoza, and glyburide.   Taking pravastatin  for HLD with no myalgias. Tries to stay very active and eat well.   Evista and vit D/calcium supplements for osteoporosis. Synthroid 75 mcg for hypothyroidism. Asymptomatic.   Relevant past medical, surgical, family and social history reviewed and updated as indicated. Interim medical history since our last visit reviewed. Allergies and medications reviewed and updated.  Review of Systems  Per HPI unless specifically indicated above     Objective:    BP 115/60 (BP Location: Right Arm, Patient Position: Sitting, Cuff Size: Normal)   Pulse 72   Temp 98.4 F (36.9 C) (Oral)   Ht 5\' 3"  (1.6 m)   Wt 156 lb (70.8 kg)   LMP  (LMP Unknown)   BMI 27.63 kg/m   Wt Readings from Last 3 Encounters:  05/30/18 156 lb (70.8 kg)  01/07/18 157 lb (71.2 kg)  01/02/18 157 lb (71.2 kg)    Physical Exam  Unable to perform PE as patient did not have access to video technology for today's visit.   Results for orders placed or performed in visit on 01/02/18  Rapid Strep Screen ([facility] ONLY)  Result Value Ref Range   Strep Gp A Ag, IA W/Reflex Negative Negative  Culture, Group A Strep  Result Value Ref Range   Strep A Culture Negative   Veritor Flu A/B Waived  Result Value Ref Range   Influenza A Negative Negative   Influenza B Negative Negative  Assessment & Plan:   Problem List Items Addressed This Visit      Cardiovascular and Mediastinum   Hypertension associated with diabetes (HCC) - Primary    Stable and WNL, continue current regimen      Relevant Medications   liraglutide (VICTOZA) 18 MG/3ML SOPN   quinapril (ACCUPRIL) 10 MG tablet   pravastatin (PRAVACHOL) 40 MG tablet   pioglitazone (ACTOS) 30 MG tablet   metFORMIN (GLUCOPHAGE-XR) 500 MG 24 hr tablet   glyBURIDE (DIABETA) 5 MG tablet     Endocrine   Controlled type 2 diabetes mellitus without complication, without long-term current use of insulin (HCC)    Recheck A1C, adjust as needed. Continue current  medications and good diet and exercise       Relevant Medications   liraglutide (VICTOZA) 18 MG/3ML SOPN   quinapril (ACCUPRIL) 10 MG tablet   pravastatin (PRAVACHOL) 40 MG tablet   pioglitazone (ACTOS) 30 MG tablet   metFORMIN (GLUCOPHAGE-XR) 500 MG 24 hr tablet   glyBURIDE (DIABETA) 5 MG tablet   Hypothyroidism    Stable, continue current regimen. Recheck TSH      Relevant Medications   levothyroxine (SYNTHROID) 75 MCG tablet     Musculoskeletal and Integument   Osteoporosis    Stable on evista and calcium vitamin D supplements. Continue current regimen        Other   Hyperlipidemia    Recheck lipids, adjust as needed. Continue good lifestyle modifications      Relevant Medications   quinapril (ACCUPRIL) 10 MG tablet   pravastatin (PRAVACHOL) 40 MG tablet       Follow up plan: Return in about 6 months (around 11/30/2018) for CPE.

## 2018-05-30 NOTE — Telephone Encounter (Signed)
Requested medication (s) are due for refill today:yes  Requested medication (s) are on the active medication list: yes  Last refill:  05/30/2018  Future visit scheduled: yes  Notes to clinic:  Appointment today     Requested Prescriptions  Pending Prescriptions Disp Refills   pioglitazone (ACTOS) 30 MG tablet [Pharmacy Med Name: PIOGLITAZONE HCL 30 MG TABLET] 90 tablet 0    Sig: TAKE (1) TABLET BY MOUTH DAILY FOR DIABETES     Endocrinology:  Diabetes - Glitazones - pioglitazone Failed - 05/30/2018  9:23 AM      Failed - HBA1C is between 0 and 7.9 and within 180 days    Hemoglobin A1C  Date Value Ref Range Status  11/24/2015 7.2%  Final   HB A1C (BAYER DCA - WAIVED)  Date Value Ref Range Status  03/09/2017 6.5 <7.0 % Final    Comment:                                          Diabetic Adult            <7.0                                       Healthy Adult        4.3 - 5.7                                                           (DCCT/NGSP) American Diabetes Association's Summary of Glycemic Recommendations for Adults with Diabetes: Hemoglobin A1c <7.0%. More stringent glycemic goals (A1c <6.0%) may further reduce complications at the cost of increased risk of hypoglycemia.    Hgb A1c MFr Bld  Date Value Ref Range Status  11/28/2017 6.5 (H) 4.8 - 5.6 % Final    Comment:             Prediabetes: 5.7 - 6.4          Diabetes: >6.4          Glycemic control for adults with diabetes: <7.0          Passed - Valid encounter within last 6 months    Recent Outpatient Visits          Today Hypertension associated with diabetes [facility](HCC)   [facility] [address] [place]   4 months ago Viral upper respiratory tract infection   [facility] [place], Corrie DandyJolene T, NP   6 months ago Annual physical exam   [facility] [place], [place], [place]   9 months ago Rash   [facility] [place], [place], [place]   1 year ago  Type 2 diabetes mellitus with stage 1 chronic kidney disease, without long-term current use of insulin (HCC)   [facility] Gabriel CirriWicker, Cheryl, NP      Future Appointments            In 6 months [facility]           quinapril (ACCUPRIL) 10 MG tablet [Pharmacy Med Name: QUINAPRIL 10 MG TABLET] 90 tablet 0    Sig: TAKE  1 TABLET BY MOUTH ONCE DAILY.     Cardiovascular:  ACE Inhibitors Failed - 05/30/2018  9:23 AM      Failed - Cr in normal range and within 180 days    Creatinine  Date Value Ref Range Status  02/23/2011 0.88 0.60 - 1.30 mg/dL Final   Creatinine, Ser  Date Value Ref Range Status  11/28/2017 0.84 0.57 - 1.00 mg/dL Final         Failed - K in normal range and within 180 days    Potassium  Date Value Ref Range Status  11/28/2017 4.0 3.5 - 5.2 mmol/L Final         Passed - Patient is not pregnant      Passed - Last BP in normal range    BP Readings from Last 1 Encounters:  05/30/18 115/60         Passed - Valid encounter within last 6 months    Recent Outpatient Visits          Today Hypertension associated with diabetes [facility])   [facility] Particia Nearing, PA-C   4 months ago Viral upper respiratory tract infection   [facility] [place], Corrie Dandy T, NP   6 months ago Annual physical exam   [facility] Roosvelt Maser [place], [place]   9 months ago Rash   [facility] [place], [place], [place]   1 year ago Type 2 diabetes mellitus with stage 1 chronic kidney disease, without long-term current use of insulin (HCC)   [facility] Gabriel Cirri, NP      Future Appointments            In 6 months [facility]           pravastatin (PRAVACHOL) 40 MG tablet [Pharmacy Med Name: PRAVASTATIN SODIUM 40 MG TAB] 90 tablet 0    Sig: TAKE 1 TABLET BY MOUTH AT BEDTIME FOR CHOLESTEROL     Cardiovascular:  Antilipid - Statins Passed - 05/30/2018  9:23  AM      Passed - Total Cholesterol in normal range and within 360 days    Cholesterol, Total  Date Value Ref Range Status  11/28/2017 135 100 - 199 mg/dL Final   Cholesterol Piccolo, Waived  Date Value Ref Range Status  05/25/2016 121 <200 mg/dL Final    Comment:                            Desirable                <200                         Borderline High      200- 239                         High                     >239          Passed - LDL in normal range and within 360 days    LDL Calculated  Date Value Ref Range Status  11/28/2017 68 0 - 99 mg/dL Final         Passed - HDL in normal range and within 360 days    HDL  Date Value Ref Range Status  11/28/2017 55 >  39 mg/dL Final         Passed - Triglycerides in normal range and within 360 days    Triglycerides  Date Value Ref Range Status  11/28/2017 61 0 - 149 mg/dL Final   Triglycerides Piccolo,Waived  Date Value Ref Range Status  05/25/2016 69 <150 mg/dL Final    Comment:                            Normal                   <150                         Borderline High     150 - 199                         High                200 - 499                         Very High                >499          Passed - Patient is not pregnant      Passed - Valid encounter within last 12 months    Recent Outpatient Visits          Today Hypertension associated with diabetes [facility])   [facility] Particia Nearing, PA-C   4 months ago Viral upper respiratory tract infection   [facility] [place], Corrie Dandy T, NP   6 months ago Annual physical exam   [facility] Particia Nearing, [place]   9 months ago Rash   [facility] [place], [place], [place]   1 year ago Type 2 diabetes mellitus with stage 1 chronic kidney disease, without long-term current use of insulin (HCC)   [facility] Gabriel Cirri, NP      Future Appointments            In 6  months [facility]           glyBURIDE (DIABETA) 5 MG tablet [Pharmacy Med Name: GLYBURIDE 5 MG TABLET] 180 tablet 0    Sig: TAKE (1) TABLET TWICE A DAY WITH FOOD---BREAKFAST AND SUPPER.     Endocrinology:  Diabetes - Sulfonylureas Failed - 05/30/2018  9:23 AM      Failed - HBA1C is between 0 and 7.9 and within 180 days    Hemoglobin A1C  Date Value Ref Range Status  11/24/2015 7.2%  Final   HB A1C (BAYER DCA - WAIVED)  Date Value Ref Range Status  03/09/2017 6.5 <7.0 % Final    Comment:                                          Diabetic Adult            <7.0                                       Healthy Adult  4.3 - 5.7                                                           (DCCT/NGSP) American Diabetes Association's Summary of Glycemic Recommendations for Adults with Diabetes: Hemoglobin A1c <7.0%. More stringent glycemic goals (A1c <6.0%) may further reduce complications at the cost of increased risk of hypoglycemia.    Hgb A1c MFr Bld  Date Value Ref Range Status  11/28/2017 6.5 (H) 4.8 - 5.6 % Final    Comment:             Prediabetes: 5.7 - 6.4          Diabetes: >6.4          Glycemic control for adults with diabetes: <7.0          Passed - Valid encounter within last 6 months    Recent Outpatient Visits          Today Hypertension associated with diabetes [facility])   [facility] Particia Nearing, [place]   4 months ago Viral upper respiratory tract infection   [facility] [place], Corrie Dandy T, NP   6 months ago Annual physical exam   [facility] Particia [place]earing, [place]   9 months ago Rash   [facility] [place], [place], [place]   1 year ago Type 2 diabetes mellitus with stage 1 chronic kidney disease, without long-term current use of insulin (HCC)   [facility] Gabriel Cirri, NP      Future Appointments            In 6 months [facility], [facility]

## 2018-05-30 NOTE — Assessment & Plan Note (Signed)
Stable on evista and calcium vitamin D supplements. Continue current regimen

## 2018-05-30 NOTE — Assessment & Plan Note (Signed)
Stable, continue current regimen. Recheck TSH

## 2018-05-31 ENCOUNTER — Other Ambulatory Visit: Payer: Medicare HMO

## 2018-05-31 DIAGNOSIS — E119 Type 2 diabetes mellitus without complications: Secondary | ICD-10-CM

## 2018-05-31 DIAGNOSIS — E1159 Type 2 diabetes mellitus with other circulatory complications: Secondary | ICD-10-CM | POA: Diagnosis not present

## 2018-05-31 DIAGNOSIS — E039 Hypothyroidism, unspecified: Secondary | ICD-10-CM

## 2018-05-31 DIAGNOSIS — I10 Essential (primary) hypertension: Secondary | ICD-10-CM | POA: Diagnosis not present

## 2018-05-31 DIAGNOSIS — E782 Mixed hyperlipidemia: Secondary | ICD-10-CM

## 2018-06-01 ENCOUNTER — Encounter: Payer: Self-pay | Admitting: Family Medicine

## 2018-06-01 LAB — COMPREHENSIVE METABOLIC PANEL
ALT: 12 IU/L (ref 0–32)
AST: 17 IU/L (ref 0–40)
Albumin/Globulin Ratio: 1.6 (ref 1.2–2.2)
Albumin: 4.1 g/dL (ref 3.8–4.8)
Alkaline Phosphatase: 79 IU/L (ref 39–117)
BUN/Creatinine Ratio: 17 (ref 12–28)
BUN: 16 mg/dL (ref 8–27)
Bilirubin Total: 0.3 mg/dL (ref 0.0–1.2)
CO2: 24 mmol/L (ref 20–29)
Calcium: 9.2 mg/dL (ref 8.7–10.3)
Chloride: 102 mmol/L (ref 96–106)
Creatinine, Ser: 0.92 mg/dL (ref 0.57–1.00)
GFR calc Af Amer: 73 mL/min/{1.73_m2} (ref >59)
GFR calc non Af Amer: 63 mL/min/{1.73_m2} (ref >59)
Globulin, Total: 2.5 g/dL (ref 1.5–4.5)
Glucose: 78 mg/dL (ref 65–99)
Potassium: 4.5 mmol/L (ref 3.5–5.2)
Sodium: 140 mmol/L (ref 134–144)
Total Protein: 6.6 g/dL (ref 6.0–8.5)

## 2018-06-01 LAB — LIPID PANEL W/O CHOL/HDL RATIO
Cholesterol, Total: 134 mg/dL (ref 100–199)
HDL: 43 mg/dL (ref >39)
LDL Calculated: 67 mg/dL (ref 0–99)
Triglycerides: 119 mg/dL (ref 0–149)
VLDL Cholesterol Cal: 24 mg/dL (ref 5–40)

## 2018-06-01 LAB — HEMOGLOBIN A1C
Est. average glucose Bld gHb Est-mCnc: 146 mg/dL
Hgb A1c MFr Bld: 6.7 % — ABNORMAL HIGH (ref 4.8–5.6)

## 2018-06-01 LAB — TSH: TSH: 1.51 u[IU]/mL (ref 0.450–4.500)

## 2018-06-27 ENCOUNTER — Telehealth: Payer: Self-pay | Admitting: Family Medicine

## 2018-06-27 NOTE — Chronic Care Management (AMB) (Signed)
°  Chronic Care Management   Outreach Note  06/27/2018 Name: Emma Berry MRN: [id number] DOB: 03-May-1950  Referred by: Volney American, PA-C Reason for referral : No chief complaint on file.   An unsuccessful telephone outreach was attempted today. The patient was referred to the case management team by for assistance with chronic care management and care coordination.   Follow Up Plan: A HIPPA compliant phone message was left for the patient providing contact information and requesting a return call.  The care management team will reach out to the patient again over the next 7 days.  If patient returns call to provider office, please advise to call Kimberly at [place]  [email or web address]   ??[phone]

## 2018-06-28 NOTE — Chronic Care Management (AMB) (Signed)
Chronic Care Management   Note  06/28/2018 Name: Emma Berry MRN: [id number] DOB: [birth year]  Emma Berry is a 68 y.o. year old female who is a primary care patient of Volney American, [place]. I reached out to Lawana Chambers by phone today in response to a referral sent by Ms. Landry Dyke Basil's health plan.    [name] was given information about Chronic Care Management services today including:  1. CCM service includes personalized support from designated clinical staff supervised by her physician, including individualized plan of care and coordination with other care providers 2. 24/7 contact phone numbers for assistance for urgent and routine care needs. 3. Service will only be billed when office clinical staff spend 20 minutes or more in a month to coordinate care. 4. Only one practitioner may furnish and bill the service in a calendar month. 5. The patient may stop CCM services at any time (effective at the end of the month) by phone call to the office staff. 6. The patient will be responsible for cost sharing (co-pay) of up to 20% of the service fee (after annual deductible is met).  Patient did not agree to enrollment in care management services and does not wish to consider at this time.  Follow up plan: The patient has been provided with contact information for the chronic care management team and has been advised to call with any health related questions or concerns.   [place]  [email or web address]   ??[phone]

## 2018-11-12 ENCOUNTER — Other Ambulatory Visit: Payer: Self-pay | Admitting: Family Medicine

## 2018-11-13 ENCOUNTER — Other Ambulatory Visit: Payer: Self-pay | Admitting: Family Medicine

## 2018-11-13 NOTE — Telephone Encounter (Signed)
Requested medication (s) are due for refill today: yes  Requested medication (s) are on the active medication list: yes  Last refill: 11/13/2018  Future visit scheduled: yes  Notes to clinic: requesting 90 day with 11 refills   Requested Prescriptions  Pending Prescriptions Disp Refills   raloxifene (EVISTA) 60 MG tablet [Pharmacy Med Name: RALOXIFENE HCL 60 MG TABLET] 90 tablet 11    Sig: TAKE 1 TABLET BY MOUTH ONCE DAILY.     OB/GYN:  Selective Estrogen Receptor Modulators Passed - 11/13/2018  7:59 AM      Passed - Valid encounter within last 12 months    Recent Outpatient Visits          5 months ago Hypertension associated with diabetes [facility])   [facility] Volney American, [place]   10 months ago Viral upper respiratory tract infection   [place], Barbaraann Faster, NP   11 months ago Annual physical exam   [facility] Volney American, [place]   1 year ago [place], Rachel [place], [place]   1 year ago Type 2 diabetes mellitus with stage 1 chronic kidney disease, without long-term current use of insulin ([place])   [place] Kathrine Haddock, NP      Future Appointments            In 2 weeks  [facility], [place]   In 2 weeks Orene Desanctis, Lilia Argue, PA-C [facility], [facility]

## 2018-11-28 ENCOUNTER — Other Ambulatory Visit: Payer: Self-pay

## 2018-11-29 ENCOUNTER — Other Ambulatory Visit: Payer: Self-pay | Admitting: Family Medicine

## 2018-11-29 ENCOUNTER — Encounter: Payer: Self-pay | Admitting: Family Medicine

## 2018-11-29 ENCOUNTER — Ambulatory Visit (INDEPENDENT_AMBULATORY_CARE_PROVIDER_SITE_OTHER): Payer: Medicare HMO | Admitting: Family Medicine

## 2018-11-29 ENCOUNTER — Ambulatory Visit (INDEPENDENT_AMBULATORY_CARE_PROVIDER_SITE_OTHER): Payer: Medicare HMO

## 2018-11-29 ENCOUNTER — Other Ambulatory Visit: Payer: Self-pay

## 2018-11-29 VITALS — BP 134/66 | HR 67 | Temp 98.1°F | Ht 63.0 in | Wt 165.4 lb

## 2018-11-29 VITALS — BP 134/66 | HR 67 | Temp 98.1°F | Resp 15 | Ht 63.0 in | Wt 165.4 lb

## 2018-11-29 DIAGNOSIS — Z1231 Encounter for screening mammogram for malignant neoplasm of breast: Secondary | ICD-10-CM

## 2018-11-29 DIAGNOSIS — I10 Essential (primary) hypertension: Secondary | ICD-10-CM | POA: Diagnosis not present

## 2018-11-29 DIAGNOSIS — M810 Age-related osteoporosis without current pathological fracture: Secondary | ICD-10-CM

## 2018-11-29 DIAGNOSIS — E782 Mixed hyperlipidemia: Secondary | ICD-10-CM

## 2018-11-29 DIAGNOSIS — R8281 Pyuria: Secondary | ICD-10-CM | POA: Diagnosis not present

## 2018-11-29 DIAGNOSIS — Z0000 Encounter for general adult medical examination without abnormal findings: Secondary | ICD-10-CM

## 2018-11-29 DIAGNOSIS — E119 Type 2 diabetes mellitus without complications: Secondary | ICD-10-CM | POA: Diagnosis not present

## 2018-11-29 DIAGNOSIS — E1159 Type 2 diabetes mellitus with other circulatory complications: Secondary | ICD-10-CM

## 2018-11-29 NOTE — Progress Notes (Signed)
Subjective:   Emma Berry is a 68 y.o. female who presents for Medicare Annual (Subsequent) preventive examination.  Review of Systems:   Cardiac Risk Factors include: advanced age (>68men, >47 women);dyslipidemia;diabetes mellitus;hypertension     Objective:     Vitals: BP 134/66 (BP Location: Left Arm, Patient Position: Sitting, Cuff Size: Normal)   Pulse 67   Temp 98.1 F (36.7 C) (Oral)   Resp 15   Ht  (1.6 m)   Wt 165 lb 6.4 oz (75 kg)   LMP  (LMP Unknown)   BMI 29.30 kg/m   Body mass index is 29.3 kg/m.  Advanced Directives 11/29/2018 11/24/2017 10/31/2017 10/04/2017 05/03/2017 02/24/2016 11/24/2015  Does Patient Have a Medical Advance Directive? Yes No No No No No No  Type of Advance Directive Living will;Healthcare Power of Attorney - - - - - -  Copy of Healthcare Power of Attorney in Chart? Yes - validated most recent copy scanned in chart (See row information) - - - - - -  Would patient like information on creating a medical advance directive? - Yes (MAU/Ambulatory/Procedural Areas - Information given) No - Patient declined No - Patient declined No - Patient declined - Yes - Transport planner given    Tobacco Social History   Tobacco Use  Smoking Status Never Smoker  Smokeless Tobacco Never Used     Counseling given: Not Answered   Clinical Intake:  Pre-visit preparation completed: Yes  Pain : No/denies pain Pain Score: 0-No pain     Nutritional Status: BMI 25 -29 Overweight Nutritional Risks: None Diabetes: Yes CBG done?: No Did pt. bring in CBG monitor from home?: No  How often do you need to have someone help you when you read instructions, pamphlets, or other written materials from your doctor or pharmacy?: 1 - Never  Nutrition Risk Assessment:  Has the patient had any N/V/D within the last 2 months?  No  Does the patient have any non-healing wounds?  No  Has the patient had any unintentional weight loss or weight gain?  No    Diabetes:  Is the patient diabetic?  Yes  If diabetic, was a CBG obtained today?  Yes  Did the patient bring in their glucometer from home?  No  How often do you monitor your CBG's? DAILY .   Financial Strains and Diabetes Management:  Are you having any financial strains with the device, your supplies or your medication? No .  Does the patient want to be seen by Chronic Care Management for management of their diabetes?  No  Would the patient like to be referred to a Nutritionist or for Diabetic Management?  No   Diabetic Exams:  Diabetic Eye Exam: [facility], will call for diabetic eye exam results to be faxed over.  Diabetic Foot Exam: will completed today at [facility]    Interpreter Needed?: No  Information entered by :: Oviya Ammar,LPN  Past Medical History:  Diagnosis Date  . Chronic kidney disease   . Diabetes mellitus without complication (HCC)   . Hyperlipidemia   . Hypertension   . Hypothyroidism   . Osteopenia   . Osteoporosis   . Thyroid disease    Past Surgical History:  Procedure Laterality Date  . CESAREAN SECTION    . COLONOSCOPY WITH PROPOFOL N/A 05/03/2017   Procedure: COLONOSCOPY WITH PROPOFOL;  Surgeon: [name], MD;  Location: [facility] ENDOSCOPY;  Service: Endoscopy;  Laterality: N/A;  . VENTRAL HERNIA REPAIR N/A  10/31/2017   Procedure: LAPAROSCOPIC VENTRAL HERNIA;  Surgeon: Carolan Shiver, MD;  Location: [facility] ORS;  Service: General;  Laterality: N/A;  . VENTRAL HERNIA REPAIR N/A 10/31/2017   Procedure: HERNIA REPAIR VENTRAL ADULT;  Surgeon: Carolan Shiver, MD;  Location: [facility] ORS;  Service: General;  Laterality: N/A;   Family History  Problem Relation Age of Onset  . Cancer Mother 57       melanoma  . Diabetes Mother   . Thyroid disease Mother   . Cancer Father 32       prostate to bone  . Diabetes Daughter   . Heart disease Maternal Grandfather   . Stroke Paternal Grandmother   . Stroke Paternal Grandfather   .  Breast cancer Neg Hx    Social History   Socioeconomic History  . Marital status: Married    Spouse name: Not on file  . Number of children: Not on file  . Years of education: Not on file  . Highest education level: Not on file  Occupational History  . Occupation: retired  Engineer, production  . Financial resource strain: Not hard at all  . Food insecurity    Worry: Never true    Inability: Never true  . Transportation needs    Medical: No    Non-medical: No  Tobacco Use  . Smoking status: Never Smoker  . Smokeless tobacco: Never Used  Substance and Sexual Activity  . Alcohol use: No    Alcohol/week: 0.0 standard drinks  . Drug use: No  . Sexual activity: Yes  Lifestyle  . Physical activity    Days per week: 3 days    Minutes per session: 30 min  . Stress: Only a little  Relationships  . Social connections    Talks on phone: More than three times a week    Gets together: More than three times a week    Attends religious service: More than 4 times per year    Active member of club or organization: No    Attends meetings of clubs or organizations: Never    Relationship status: Married  Other Topics Concern  . Not on file  Social History Narrative  . Not on file    Outpatient Encounter Medications as of 11/29/2018  Medication Sig  . aspirin EC 81 MG tablet Take 81 mg by mouth at bedtime.  . calcium carbonate (CALCIUM 600) 600 MG TABS tablet Take 600 mg by mouth at bedtime.  . Cholecalciferol (VITAMIN D3) 5000 units CAPS Take 1 capsule by mouth daily.  . ferrous gluconate (FERGON) 225 (27 Fe) MG tablet Take 240 mg by mouth daily.  Marland Kitchen glyBURIDE (DIABETA) 5 MG tablet TAKE (1) TABLET TWICE A DAY WITH FOOD---BREAKFAST AND SUPPER.  Marland Kitchen levothyroxine (SYNTHROID) 75 MCG tablet TAKE 1 TABLET BY MOUTH ONCE DAILY BEFORE BREAKFAST.  Marland Kitchen liraglutide (VICTOZA) 18 MG/3ML SOPN INJECT 1.8MG  S.Q. ONCE DAILY.  . metFORMIN (GLUCOPHAGE-XR) 500 MG 24 hr tablet TAKE 4 TABLET BY MOUTH ONCE DAILY FOR  DIABETES. TAKE WITH BREAKFAST.  . ONE TOUCH ULTRA TEST test strip CHECK BLOOD SUGAR ONCE DAILY  . pioglitazone (ACTOS) 30 MG tablet TAKE (1) TABLET BY MOUTH DAILY FOR DIABETES  . pravastatin (PRAVACHOL) 40 MG tablet TAKE 1 TABLET BY MOUTH AT BEDTIME FOR CHOLESTEROL  . quinapril (ACCUPRIL) 10 MG tablet Take 1 tablet (10 mg total) by mouth daily.  . raloxifene (EVISTA) 60 MG tablet TAKE 1 TABLET BY MOUTH ONCE DAILY.  . vitamin B-12 (CYANOCOBALAMIN) 1000 MCG  tablet Take 1,000 mcg by mouth at bedtime.   No facility-administered encounter medications on file as of 11/29/2018.     Activities of Daily Living In your present state of health, do you have any difficulty performing the following activities: 11/29/2018  Hearing? N  Comment no hearing aids  Vision? N  Comment eyeglasses, Dr.patty annually  Difficulty concentrating or making decisions? N  Walking or climbing stairs? N  Dressing or bathing? N  Doing errands, shopping? N  Preparing Food and eating ? N  Using the Toilet? N  In the past six months, have you accidently leaked urine? N  Do you have problems with loss of bowel control? N  Managing your Medications? N  Managing your Finances? N  Housekeeping or managing your Housekeeping? N  Some recent data might be hidden    Patient Care Team: Particia NearingLane, Rachel Elizabeth, PA-C as PCP - General (Family Medicine)    Assessment:   This is a routine wellness examination for Dois DavenportSandra.  Exercise Activities and Dietary recommendations Current Exercise Habits: The patient does not participate in regular exercise at present;Home exercise routine, Type of exercise: walking, Time (Minutes): 30, Frequency (Times/Week): 3, Weekly Exercise (Minutes/Week): 90, Intensity: Mild, Exercise limited by: None identified  Goals   None     Fall Risk: Fall Risk  11/29/2018 11/24/2017 11/23/2016 11/24/2015 11/18/2014  Falls in the past year? 0 0 No No No  Number falls in past yr: 0 0 - - -  Injury with  Fall? 0 0 - - -    FALL RISK PREVENTION PERTAINING TO THE HOME:  Any stairs in or around the home? Yes  If so, are there any without handrails? No   Home free of loose throw rugs in walkways, pet beds, electrical cords, etc? Yes  Adequate lighting in your home to reduce risk of falls? Yes   ASSISTIVE DEVICES UTILIZED TO PREVENT FALLS:  Life alert? No  Use of a cane, walker or w/c? No  Grab bars in the bathroom? No  Shower chair or bench in shower? No  Elevated toilet seat or a handicapped toilet? Yes   TIMED UP AND GO:  Was the test performed? Yes .  Length of time to ambulate 10 feet: 8 sec.   GAIT:  Appearance of gait: Gait steady and fast without the use of an assistive device.  Education: Fall risk prevention has been discussed.  Intervention(s) required? No   DME/home health order needed?  No    Depression Screen PHQ 2/9 Scores 11/29/2018 05/30/2018 11/24/2017 11/23/2016  PHQ - 2 Score 0 0 0 0  PHQ- 9 Score - - - 0     Cognitive Function     6CIT Screen 11/29/2018 11/24/2017  What Year? 0 points 0 points  What month? 0 points 0 points  What time? 0 points 0 points  Count back from 20 0 points 0 points  Months in reverse 0 points 0 points  Repeat phrase 0 points 2 points  Total Score 0 2    Immunization History  Administered Date(s) Administered  . Influenza, High Dose Seasonal PF 11/24/2015, 11/23/2016, 11/24/2017  . Influenza,inj,Quad PF,6+ Mos 11/18/2014  . Influenza-Unspecified 10/30/2018  . Pneumococcal Conjugate-13 11/18/2014  . Pneumococcal Polysaccharide-23 04/16/2013  . Td 08/19/2004  . Tdap 11/18/2014  . Zoster 10/01/2009    Qualifies for Shingles Vaccine? Yes  Zostavax completed 2011. Due for Shingrix. Education has been provided regarding the importance of this vaccine. Pt has been advised to  call insurance company to determine out of pocket expense. Advised may also receive vaccine at local pharmacy or Health Dept. Verbalized acceptance  and understanding.  Tdap: up to date   Flu Vaccine: up to date   Pneumococcal Vaccine: up to date   Screening Tests Health Maintenance  Topic Date Due  . OPHTHALMOLOGY EXAM  03/30/2016  . FOOT EXAM  11/23/2017  . HEMOGLOBIN A1C  12/01/2018  . MAMMOGRAM  12/20/2018  . TETANUS/TDAP  11/17/2024  . COLONOSCOPY  05/04/2027  . INFLUENZA VACCINE  Completed  . Hepatitis C Screening  Completed  . PNA vac Low Risk Adult  Completed  . DEXA SCAN  Addressed    Cancer Screenings:  Colorectal Screening: Completed 05/03/2017. Repeat every 10 years  Mammogram: Completed 12/19/2017. Repeat every year  Bone Density: Completed 06/25/2015.   Lung Cancer Screening: (Low Dose CT Chest recommended if Age 14-80 years, 30 pack-year currently smoking OR have quit w/in 15years.) does not qualify.    Additional Screening:  Hepatitis C Screening: does qualify; Completed 2016  Dental Screening: Recommended annual dental exams for proper oral hygiene   Community Resource Referral:  CRR required this visit?  No       Plan:  I have personally reviewed and addressed the Medicare Annual Wellness questionnaire and have noted the following in the patient's chart:  A. Medical and social history B. Use of alcohol, tobacco or illicit drugs  C. Current medications and supplements D. Functional ability and status E.  Nutritional status F.  Physical activity G. Advance directives H. List of other physicians I.  Hospitalizations, surgeries, and ER visits in previous 12 months J.  [place] such as hearing and vision if needed, cognitive and depression L. Referrals and appointments   In addition, I have reviewed and discussed with patient certain preventive protocols, quality metrics, and best practice recommendations. A written personalized care plan for preventive services as well as general preventive health recommendations were provided to patient.   Signed,    Bevelyn Ngo, LPN   [id number]/26/8341 Nurse Health Advisor   Nurse Notes: none

## 2018-11-29 NOTE — Progress Notes (Signed)
BP 134/66   Pulse 67   Temp 98.1 F (36.7 C) (Temporal)   Ht 5\' 3"  (1.6 m)   Wt 165 lb 6.4 oz (75 kg)   LMP  (LMP Unknown)   BMI 29.30 kg/m    Subjective:    Patient ID: Emma Berry, female    DOB: 24-Nov-1950, 68 y.o.   MRN: [id number]  HPI: MANJOT BEUMER is a 68 y.o. female presenting on 11/29/2018 for comprehensive medical examination. Current medical complaints include:see below  HTN - home BPs 120s-130s/60s, taking medicine faithfully without side effects. Denies CP, SOB, HAs, dizziness. Stays very active at home, eats healthy diet.   DM - taking medicines faithfully without low blood sugar spells. Does not check home BSs regularly.  Osteoporosis - on evista, tries to do weight bearing exercises and takes calcium and vit D supplements.   HLD - on pravastatin, tolerating well. No claudication, myalgias.   She currently lives with: Menopausal Symptoms: no  Depression Screen done today and results listed below:  Depression screen [facility] 2/9 11/29/2018 05/30/2018 11/24/2017 11/23/2016 11/24/2015  Decreased Interest 0 0 0 0 0  Down, Depressed, Hopeless 0 0 0 0 0  PHQ - 2 Score 0 0 0 0 0  Altered sleeping - - - 0 0  Tired, decreased energy - - - 0 0  Change in appetite - - - 0 0  Feeling bad or failure about yourself  - - - 0 0  Trouble concentrating - - - 0 0  Moving slowly or fidgety/restless - - - 0 0  Suicidal thoughts - - - 0 0  PHQ-9 Score - - - 0 0    The patient does not have a history of falls. I did not complete a risk assessment for falls. A plan of care for falls was not documented.   Past Medical History:  Past Medical History:  Diagnosis Date  . Chronic kidney disease   . Diabetes mellitus without complication ([place])   . Hyperlipidemia   . Hypertension   . Hypothyroidism   . Osteopenia   . Osteoporosis   . Thyroid disease     Surgical History:  Past Surgical History:  Procedure Laterality Date  . CESAREAN SECTION    . COLONOSCOPY WITH PROPOFOL  N/A 05/03/2017   Procedure: COLONOSCOPY WITH PROPOFOL;  Surgeon: Lollie Sails, MD;  Location: [facility] ENDOSCOPY;  Service: Endoscopy;  Laterality: N/A;  . VENTRAL HERNIA REPAIR N/A 10/31/2017   Procedure: LAPAROSCOPIC VENTRAL HERNIA;  Surgeon: Herbert Pun, MD;  Location: [facility] ORS;  Service: General;  Laterality: N/A;  . VENTRAL HERNIA REPAIR N/A 10/31/2017   Procedure: HERNIA REPAIR VENTRAL ADULT;  Surgeon: Herbert Pun, MD;  Location: [facility] ORS;  Service: General;  Laterality: N/A;    Medications:  Current Outpatient Medications on File Prior to Visit  Medication Sig  . aspirin EC 81 MG tablet Take 81 mg by mouth at bedtime.  . calcium carbonate (CALCIUM 600) 600 MG TABS tablet Take 600 mg by mouth at bedtime.  . Cholecalciferol (VITAMIN D3) 5000 units CAPS Take 1 capsule by mouth daily.  . ferrous gluconate (FERGON) 225 (27 Fe) MG tablet Take 240 mg by mouth daily.  Marland Kitchen levothyroxine (SYNTHROID) 75 MCG tablet TAKE 1 TABLET BY MOUTH ONCE DAILY BEFORE BREAKFAST.  Marland Kitchen liraglutide (VICTOZA) 18 MG/3ML SOPN INJECT 1.8MG  S.Q. ONCE DAILY.  . ONE TOUCH ULTRA TEST test strip CHECK BLOOD SUGAR ONCE DAILY  . raloxifene (EVISTA) 60 MG tablet  TAKE 1 TABLET BY MOUTH ONCE DAILY.  . vitamin B-12 (CYANOCOBALAMIN) 1000 MCG tablet Take 1,000 mcg by mouth at bedtime.   No current facility-administered medications on file prior to visit.     Allergies:  No Known Allergies  Social History:  Social History   Socioeconomic History  . Marital status: Married    Spouse name: Not on file  . Number of children: Not on file  . Years of education: Not on file  . Highest education level: Not on file  Occupational History  . Occupation: retired  Engineer, production  . Financial resource strain: Not hard at all  . Food insecurity    Worry: Never true    Inability: Never true  . Transportation needs    Medical: No    Non-medical: No  Tobacco Use  . Smoking status: Never Smoker  . Smokeless  tobacco: Never Used  Substance and Sexual Activity  . Alcohol use: No    Alcohol/week: 0.0 standard drinks  . Drug use: No  . Sexual activity: Yes  Lifestyle  . Physical activity    Days per week: 3 days    Minutes per session: 30 min  . Stress: Only a little  Relationships  . Social connections    Talks on phone: More than three times a week    Gets together: More than three times a week    Attends religious service: More than 4 times per year    Active member of club or organization: No    Attends meetings of clubs or organizations: Never    Relationship status: Married  . Intimate partner violence    Fear of current or ex partner: No    Emotionally abused: No    Physically abused: No    Forced sexual activity: No  Other Topics Concern  . Not on file  Social History Narrative  . Not on file   Social History   Tobacco Use  Smoking Status Never Smoker  Smokeless Tobacco Never Used   Social History   Substance and Sexual Activity  Alcohol Use No  . Alcohol/week: 0.0 standard drinks    Family History:  Family History  Problem Relation Age of Onset  . Cancer Mother 45       melanoma  . Diabetes Mother   . Thyroid disease Mother   . Cancer Father 26       prostate to bone  . Diabetes Daughter   . Heart disease Maternal Grandfather   . Stroke Paternal Grandmother   . Stroke Paternal Grandfather   . Breast cancer Neg Hx     Past medical history, surgical history, medications, allergies, family history and social history reviewed with patient today and changes made to appropriate areas of the chart.   Review of Systems - General ROS: negative Psychological ROS: negative Ophthalmic ROS: negative ENT ROS: negative Allergy and Immunology ROS: negative Hematological and Lymphatic ROS: negative Endocrine ROS: negative Breast ROS: negative for breast lumps Respiratory ROS: no cough, shortness of breath, or wheezing Cardiovascular ROS: no chest pain or dyspnea  on exertion Gastrointestinal ROS: no abdominal pain, change in bowel habits, or black or bloody stools Genito-Urinary ROS: no dysuria, trouble voiding, or hematuria Musculoskeletal ROS: negative Neurological ROS: no TIA or stroke symptoms Dermatological ROS: negative All other ROS negative except what is listed above and in the HPI.      Objective:    BP 134/66   Pulse 67   Temp 98.1 F (  36.7 C) (Temporal)   Ht 5\' 3"  (1.6 m)   Wt 165 lb 6.4 oz (75 kg)   LMP  (LMP Unknown)   BMI 29.30 kg/m   Wt Readings from Last 3 Encounters:  11/29/18 165 lb 6.4 oz (75 kg)  11/29/18 165 lb 6.4 oz (75 kg)  05/30/18 156 lb (70.8 kg)    Physical Exam Vitals signs and nursing note reviewed. Exam conducted with a chaperone present.  Constitutional:      General: She is not in acute distress.    Appearance: She is well-developed.  HENT:     Head: Atraumatic.     Right Ear: External ear normal.     Left Ear: External ear normal.     Nose: Nose normal.     Mouth/Throat:     Pharynx: No oropharyngeal exudate.  Eyes:     General: No scleral icterus.    Conjunctiva/sclera: Conjunctivae normal.     Pupils: Pupils are equal, round, and reactive to light.  Neck:     Musculoskeletal: Normal range of motion and neck supple.     Thyroid: No thyromegaly.  Cardiovascular:     Rate and Rhythm: Normal rate and regular rhythm.     Heart sounds: Normal heart sounds.  Pulmonary:     Effort: Pulmonary effort is normal. No respiratory distress.     Breath sounds: Normal breath sounds.  Chest:     Breasts:        Right: No mass, skin change or tenderness.        Left: No mass, skin change or tenderness.  Abdominal:     General: Bowel sounds are normal.     Palpations: Abdomen is soft. There is no mass.     Tenderness: There is no abdominal tenderness.  Genitourinary:    Comments: GU exam declined Musculoskeletal: Normal range of motion.        General: No tenderness.  Lymphadenopathy:      Cervical: No cervical adenopathy.     Upper Body:     Right upper body: No axillary adenopathy.     Left upper body: No axillary adenopathy.  Skin:    General: Skin is warm and dry.     Findings: No rash.  Neurological:     Mental Status: She is alert and oriented to person, place, and time.     Cranial Nerves: No cranial nerve deficit.  Psychiatric:        Behavior: Behavior normal.    Diabetic Foot Exam - Simple   Simple Foot Form Diabetic Foot exam was performed with the following findings: Yes 11/29/2018 11:00 AM  Visual Inspection No deformities, no ulcerations, no other skin breakdown bilaterally: Yes Sensation Testing Intact to touch and monofilament testing bilaterally: Yes Pulse Check Posterior Tibialis and Dorsalis pulse intact bilaterally: Yes Comments     Results for orders placed or performed in visit on 11/29/18  Microscopic Examination   URINE  Result Value Ref Range   WBC, UA 0-5 0 - 5 /hpf   RBC 0-2 0 - 2 /hpf   Epithelial Cells (non renal) 0-10 0 - 10 /hpf   Bacteria, UA None seen None seen/Few  Urine Culture, Reflex   URINE  Result Value Ref Range   Urine Culture, Routine Final report    Organism ID, Bacteria Comment   CBC with Differential/Platelet out  Result Value Ref Range   WBC 5.5 3.4 - 10.8 x10E3/uL   RBC 3.72 (L) 3.77 -  5.28 x10E6/uL   Hemoglobin 12.3 11.1 - 15.9 g/dL   Hematocrit 16.137.9 09.634.0 - 46.6 %   MCV 102 (H) 79 - 97 fL   MCH 33.1 (H) 26.6 - 33.0 pg   MCHC 32.5 31.5 - 35.7 g/dL   RDW 04.512.5 40.911.7 - 81.115.4 %   Platelets 250 150 - 450 x10E3/uL   Neutrophils 63 Not Estab. %   Lymphs 25 Not Estab. %   Monocytes 10 Not Estab. %   Eos 1 Not Estab. %   Basos 1 Not Estab. %   Neutrophils Absolute 3.5 1.4 - 7.0 x10E3/uL   Lymphocytes Absolute 1.3 0.7 - 3.1 x10E3/uL   Monocytes Absolute 0.5 0.1 - 0.9 x10E3/uL   EOS (ABSOLUTE) 0.1 0.0 - 0.4 x10E3/uL   Basophils Absolute 0.0 0.0 - 0.2 x10E3/uL   Immature Granulocytes 0 Not Estab. %    Immature Grans (Abs) 0.0 0.0 - 0.1 x10E3/uL   Hematology Comments: Note:   Comprehensive metabolic panel  Result Value Ref Range   Glucose 84 65 - 99 mg/dL   BUN 18 8 - 27 mg/dL   Creatinine, Ser 9.141.01 (H) 0.57 - 1.00 mg/dL   GFR calc non Af Amer 57 (L) >59 mL/min/1.73   GFR calc Af Amer 65 >59 mL/min/1.73   BUN/Creatinine Ratio 18 12 - 28   Sodium 139 134 - 144 mmol/L   Potassium 4.2 3.5 - 5.2 mmol/L   Chloride 101 96 - 106 mmol/L   CO2 25 20 - 29 mmol/L   Calcium 9.3 8.7 - 10.3 mg/dL   Total Protein 6.9 6.0 - 8.5 g/dL   Albumin 4.4 3.8 - 4.8 g/dL   Globulin, Total 2.5 1.5 - 4.5 g/dL   Albumin/Globulin Ratio 1.8 1.2 - 2.2   Bilirubin Total 0.4 0.0 - 1.2 mg/dL   Alkaline Phosphatase 79 39 - 117 IU/L   AST 19 0 - 40 IU/L   ALT 13 0 - 32 IU/L  Lipid Panel w/o Chol/HDL Ratio out  Result Value Ref Range   Cholesterol, Total 126 100 - 199 mg/dL   Triglycerides 66 0 - 149 mg/dL   HDL 53 >78>39 mg/dL   VLDL Cholesterol Cal 14 5 - 40 mg/dL   LDL Chol Calc (NIH) 59 0 - 99 mg/dL  UA/M w/rflx Culture, Routine   Specimen: Urine   URINE  Result Value Ref Range   Specific Gravity, UA 1.015 1.005 - 1.030   pH, UA 5.0 5.0 - 7.5   Color, UA Yellow Yellow   Appearance Ur Clear Clear   Leukocytes,UA Trace (A) Negative   Protein,UA Negative Negative/Trace   Glucose, UA Negative Negative   Ketones, UA Negative Negative   RBC, UA Trace (A) Negative   Bilirubin, UA Negative Negative   Urobilinogen, Ur 0.2 0.2 - 1.0 mg/dL   Nitrite, UA Negative Negative   Microscopic Examination See below:    Urinalysis Reflex Comment   HgB A1c  Result Value Ref Range   Hgb A1c MFr Bld 6.7 (H) 4.8 - 5.6 %   Est. average glucose Bld gHb Est-mCnc 146 mg/dL      Assessment & Plan:   Problem List Items Addressed This Visit      Cardiovascular and Mediastinum   Hypertension associated with diabetes (HCC)    BPs stable and WNL, continue current regimen      Relevant Orders   CBC with  Differential/Platelet out (Completed)   UA/M w/rflx Culture, Routine (Completed)     Endocrine  Controlled type 2 diabetes mellitus without complication, without long-term current use of insulin (HCC) - Primary    Recheck A1C, adjust as needed. Continue present medications and good lifestyle modifications.       Relevant Orders   HgB A1c (Completed)     Musculoskeletal and Integument   Osteoporosis    Stable, continue evista, supplements and good exercise        Other   Hyperlipidemia    Recheck lipids, adjust as needed. Continue good diet and exercise      Relevant Orders   Comprehensive metabolic panel (Completed)   Lipid Panel w/o Chol/HDL Ratio out (Completed)    Other Visit Diagnoses    Annual physical exam       Encounter for screening mammogram for malignant neoplasm of breast       Relevant Orders   MM DIGITAL SCREENING BILATERAL       Follow up plan: Return in about 6 months (around 05/29/2019) for 6 month f/u.   LABORATORY TESTING:  - Pap smear: not applicable  IMMUNIZATIONS:   - Tdap: Tetanus vaccination status reviewed: last tetanus booster within 10 years. - Influenza: Up to date - Pneumovax: Up to date - Prevnar: Up to date - HPV: Not applicable - Zostavax vaccine: Up to date  SCREENING: -Mammogram: Ordered today  - Colonoscopy: Up to date  - Bone Density: Up to date   PATIENT COUNSELING:   Advised to take 1 mg of folate supplement per day if capable of pregnancy.   Sexuality: Discussed sexually transmitted diseases, partner selection, use of condoms, avoidance of unintended pregnancy  and contraceptive alternatives.   Advised to avoid cigarette smoking.  I discussed with the patient that most people either abstain from alcohol or drink within safe limits (<=14/week and <=4 drinks/occasion for males, <=7/weeks and <= 3 drinks/occasion for females) and that the risk for alcohol disorders and other health effects rises proportionally with the  number of drinks per week and how often a drinker exceeds daily limits.  Discussed cessation/primary prevention of drug use and availability of treatment for abuse.   Diet: Encouraged to adjust caloric intake to maintain  or achieve ideal body weight, to reduce intake of dietary saturated fat and total fat, to limit sodium intake by avoiding high sodium foods and not adding table salt, and to maintain adequate dietary potassium and calcium preferably from fresh fruits, vegetables, and low-fat dairy products.    stressed the importance of regular exercise  Injury prevention: Discussed safety belts, safety helmets, smoke detector, smoking near bedding or upholstery.   Dental health: Discussed importance of regular tooth brushing, flossing, and dental visits.    NEXT PREVENTATIVE PHYSICAL DUE IN 1 YEAR. Return in about 6 months (around 05/29/2019) for 6 month f/u.

## 2018-11-29 NOTE — Patient Instructions (Signed)
Emma Berry , Thank you for taking time to come for your Medicare Wellness Visit. I appreciate your ongoing commitment to your health goals. Please review the following plan we discussed and let me know if I can assist you in the future.   Screening recommendations/referrals: Colonoscopy: completed 05/03/2017 Mammogram: completed 12/19/2017 Please call [phone] to schedule your mammogram.  Bone Density: completed 06/25/2015 Recommended yearly ophthalmology/optometry visit for glaucoma screening and checkup Recommended yearly dental visit for hygiene and checkup  Vaccinations: Influenza vaccine: up to date Pneumococcal vaccine: up to date Tdap vaccine: up to date Shingles vaccine: shingrix eligible     Advanced directives: Please bring a copy of your health care power of attorney and living will to the office at your convenience.  Conditions/risks identified: diabetic, discussed chronic care management team.   Next appointment: Follow up in one year for your annual wellness visit    Preventive Care 65 Years and Older, Female Preventive care refers to lifestyle choices and visits with your health care provider that can promote health and wellness. What does preventive care include?  A yearly physical exam. This is also called an annual well check.  Dental exams once or twice a year.  Routine eye exams. Ask your health care provider how often you should have your eyes checked.  Personal lifestyle choices, including:  Daily care of your teeth and gums.  Regular physical activity.  Eating a healthy diet.  Avoiding tobacco and drug use.  Limiting alcohol use.  Practicing safe sex.  Taking low-dose aspirin every day.  Taking vitamin and mineral supplements as recommended by your health care provider. What happens during an annual well check? The services and screenings done by your health care provider during your annual well check will depend on your age, overall  health, lifestyle risk factors, and family history of disease. Counseling  Your health care provider may ask you questions about your:  Alcohol use.  Tobacco use.  Drug use.  Emotional well-being.  Home and relationship well-being.  Sexual activity.  Eating habits.  History of falls.  Memory and ability to understand (cognition).  Work and work Statistician.  Reproductive health. Screening  You may have the following tests or measurements:  Height, weight, and BMI.  Blood pressure.  Lipid and cholesterol levels. These may be checked every 5 years, or more frequently if you are over 8 years old.  Skin check.  Lung cancer screening. You may have this screening every year starting at age 21 if you have a 30-pack-year history of smoking and currently smoke or have quit within the past 15 years.  Fecal occult blood test (FOBT) of the stool. You may have this test every year starting at age 13.  Flexible sigmoidoscopy or colonoscopy. You may have a sigmoidoscopy every 5 years or a colonoscopy every 10 years starting at age 9.  Hepatitis C blood test.  Hepatitis B blood test.  Sexually transmitted disease (STD) testing.  Diabetes screening. This is done by checking your blood sugar (glucose) after you have not eaten for a while (fasting). You may have this done every 1-3 years.  Bone density scan. This is done to screen for osteoporosis. You may have this done starting at age 5.  Mammogram. This may be done every 1-2 years. Talk to your health care provider about how often you should have regular mammograms. Talk with your health care provider about your test results, treatment options, and if necessary, the need for more tests.  Vaccines  Your health care provider may recommend certain vaccines, such as:  Influenza vaccine. This is recommended every year.  Tetanus, diphtheria, and acellular pertussis (Tdap, Td) vaccine. You may need a Td booster every 10 years.   Zoster vaccine. You may need this after age 60.  Pneumococcal 13-valent conjugate (PCV13) vaccine. One dose is recommended after age 83.  Pneumococcal polysaccharide (PPSV23) vaccine. One dose is recommended after age 37. Talk to your health care provider about which screenings and vaccines you need and how often you need them. This information is not intended to replace advice given to you by your health care provider. Make sure you discuss any questions you have with your health care provider. Document Released: 01/24/2015 Document Revised: 09/17/2015 Document Reviewed: 10/29/2014 Elsevier Interactive Patient Education  2017 [employer] Prevention in the Home Falls can cause injuries. They can happen to people of all ages. There are many things you can do to make your home safe and to help prevent falls. What can I do on the outside of my home?  Regularly fix the edges of walkways and driveways and fix any cracks.  Remove anything that might make you trip as you walk through a door, such as a raised step or threshold.  Trim any bushes or trees on the path to your home.  Use bright outdoor lighting.  Clear any walking paths of anything that might make someone trip, such as rocks or tools.  Regularly check to see if handrails are loose or broken. Make sure that both sides of any steps have handrails.  Any raised decks and porches should have guardrails on the edges.  Have any leaves, snow, or ice cleared regularly.  Use sand or salt on walking paths during winter.  Clean up any spills in your garage right away. This includes oil or grease spills. What can I do in the bathroom?  Use night lights.  Install grab bars by the toilet and in the tub and shower. Do not use towel bars as grab bars.  Use non-skid mats or decals in the tub or shower.  If you need to sit down in the shower, use a plastic, non-slip stool.  Keep the floor dry. Clean up any water that spills  on the floor as soon as it happens.  Remove soap buildup in the tub or shower regularly.  Attach bath mats securely with double-sided non-slip rug tape.  Do not have throw rugs and other things on the floor that can make you trip. What can I do in the bedroom?  Use night lights.  Make sure that you have a light by your bed that is easy to reach.  Do not use any sheets or blankets that are too big for your bed. They should not hang down onto the floor.  Have a firm chair that has side arms. You can use this for support while you get dressed.  Do not have throw rugs and other things on the floor that can make you trip. What can I do in the kitchen?  Clean up any spills right away.  Avoid walking on wet floors.  Keep items that you use a lot in easy-to-reach places.  If you need to reach something above you, use a strong step stool that has a grab bar.  Keep electrical cords out of the way.  Do not use floor polish or wax that makes floors slippery. If you must use wax, use non-skid floor wax.  Do not have throw rugs and other things on the floor that can make you trip. What can I do with my stairs?  Do not leave any items on the stairs.  Make sure that there are handrails on both sides of the stairs and use them. Fix handrails that are broken or loose. Make sure that handrails are as long as the stairways.  Check any carpeting to make sure that it is firmly attached to the stairs. Fix any carpet that is loose or worn.  Avoid having throw rugs at the top or bottom of the stairs. If you do have throw rugs, attach them to the floor with carpet tape.  Make sure that you have a light switch at the top of the stairs and the bottom of the stairs. If you do not have them, ask someone to add them for you. What else can I do to help prevent falls?  Wear shoes that:  Do not have high heels.  Have rubber bottoms.  Are comfortable and fit you well.  Are closed at the toe. Do  not wear sandals.  If you use a stepladder:  Make sure that it is fully opened. Do not climb a closed stepladder.  Make sure that both sides of the stepladder are locked into place.  Ask someone to hold it for you, if possible.  Clearly mark and make sure that you can see:  Any grab bars or handrails.  First and last steps.  Where the edge of each step is.  Use tools that help you move around (mobility aids) if they are needed. These include:  Canes.  Walkers.  Scooters.  Crutches.  Turn on the lights when you go into a dark area. Replace any light bulbs as soon as they burn out.  Set up your furniture so you have a clear path. Avoid moving your furniture around.  If any of your floors are uneven, fix them.  If there are any pets around you, be aware of where they are.  Review your medicines with your doctor. Some medicines can make you feel dizzy. This can increase your chance of falling. Ask your doctor what other things that you can do to help prevent falls. This information is not intended to replace advice given to you by your health care provider. Make sure you discuss any questions you have with your health care provider. Document Released: 10/24/2008 Document Revised: 06/05/2015 Document Reviewed: 02/01/2014 Elsevier Interactive Patient Education  2017 [employer].

## 2018-11-30 LAB — CBC WITH DIFFERENTIAL/PLATELET
Basophils Absolute: 0 10*3/uL (ref 0.0–0.2)
Basos: 1 %
EOS (ABSOLUTE): 0.1 10*3/uL (ref 0.0–0.4)
Eos: 1 %
Hematocrit: 37.9 % (ref 34.0–46.6)
Hemoglobin: 12.3 g/dL (ref 11.1–15.9)
Immature Grans (Abs): 0 10*3/uL (ref 0.0–0.1)
Immature Granulocytes: 0 %
Lymphocytes Absolute: 1.3 10*3/uL (ref 0.7–3.1)
Lymphs: 25 %
MCH: 33.1 pg — ABNORMAL HIGH (ref 26.6–33.0)
MCHC: 32.5 g/dL (ref 31.5–35.7)
MCV: 102 fL — ABNORMAL HIGH (ref 79–97)
Monocytes Absolute: 0.5 10*3/uL (ref 0.1–0.9)
Monocytes: 10 %
Neutrophils Absolute: 3.5 10*3/uL (ref 1.4–7.0)
Neutrophils: 63 %
Platelets: 250 10*3/uL (ref 150–450)
RBC: 3.72 x10E6/uL — ABNORMAL LOW (ref 3.77–5.28)
RDW: 12.5 % (ref 11.7–15.4)
WBC: 5.5 10*3/uL (ref 3.4–10.8)

## 2018-11-30 LAB — COMPREHENSIVE METABOLIC PANEL
ALT: 13 IU/L (ref 0–32)
AST: 19 IU/L (ref 0–40)
Albumin/Globulin Ratio: 1.8 (ref 1.2–2.2)
Albumin: 4.4 g/dL (ref 3.8–4.8)
Alkaline Phosphatase: 79 IU/L (ref 39–117)
BUN/Creatinine Ratio: 18 (ref 12–28)
BUN: 18 mg/dL (ref 8–27)
Bilirubin Total: 0.4 mg/dL (ref 0.0–1.2)
CO2: 25 mmol/L (ref 20–29)
Calcium: 9.3 mg/dL (ref 8.7–10.3)
Chloride: 101 mmol/L (ref 96–106)
Creatinine, Ser: 1.01 mg/dL — ABNORMAL HIGH (ref 0.57–1.00)
GFR calc Af Amer: 65 mL/min/{1.73_m2} (ref >59)
GFR calc non Af Amer: 57 mL/min/{1.73_m2} — ABNORMAL LOW (ref >59)
Globulin, Total: 2.5 g/dL (ref 1.5–4.5)
Glucose: 84 mg/dL (ref 65–99)
Potassium: 4.2 mmol/L (ref 3.5–5.2)
Sodium: 139 mmol/L (ref 134–144)
Total Protein: 6.9 g/dL (ref 6.0–8.5)

## 2018-11-30 LAB — LIPID PANEL W/O CHOL/HDL RATIO
Cholesterol, Total: 126 mg/dL (ref 100–199)
HDL: 53 mg/dL (ref >39)
LDL Chol Calc (NIH): 59 mg/dL (ref 0–99)
Triglycerides: 66 mg/dL (ref 0–149)
VLDL Cholesterol Cal: 14 mg/dL (ref 5–40)

## 2018-11-30 LAB — HEMOGLOBIN A1C
Est. average glucose Bld gHb Est-mCnc: 146 mg/dL
Hgb A1c MFr Bld: 6.7 % — ABNORMAL HIGH (ref 4.8–5.6)

## 2018-12-01 LAB — UA/M W/RFLX CULTURE, ROUTINE
Bilirubin, UA: NEGATIVE
Glucose, UA: NEGATIVE
Ketones, UA: NEGATIVE
Nitrite, UA: NEGATIVE
Protein,UA: NEGATIVE
Specific Gravity, UA: 1.015 (ref 1.005–1.030)
Urobilinogen, Ur: 0.2 mg/dL (ref 0.2–1.0)
pH, UA: 5 (ref 5.0–7.5)

## 2018-12-01 LAB — MICROSCOPIC EXAMINATION: Bacteria, UA: NONE SEEN

## 2018-12-01 LAB — URINE CULTURE, REFLEX

## 2018-12-05 NOTE — Assessment & Plan Note (Signed)
BPs stable and WNL, continue current regimen 

## 2018-12-05 NOTE — Assessment & Plan Note (Signed)
Stable, continue evista, supplements and good exercise

## 2018-12-05 NOTE — Assessment & Plan Note (Signed)
Recheck lipids, adjust as needed. Continue good diet and exercise 

## 2018-12-05 NOTE — Assessment & Plan Note (Signed)
Recheck A1C, adjust as needed. Continue present medications and good lifestyle modifications.

## 2018-12-21 ENCOUNTER — Ambulatory Visit
Admission: RE | Admit: 2018-12-21 | Discharge: 2018-12-21 | Disposition: A | Discharge status: Home / Self Care | Payer: Medicare HMO | Source: Ambulatory Visit | Attending: Family Medicine | Admitting: Family Medicine

## 2018-12-21 ENCOUNTER — Other Ambulatory Visit: Payer: Self-pay

## 2018-12-21 DIAGNOSIS — Z1231 Encounter for screening mammogram for malignant neoplasm of breast: Secondary | ICD-10-CM | POA: Insufficient documentation

## 2019-02-11 ENCOUNTER — Other Ambulatory Visit: Payer: Self-pay | Admitting: Family Medicine

## 2019-02-12 NOTE — Telephone Encounter (Signed)
Requested Prescriptions  Pending Prescriptions Disp Refills  . raloxifene (EVISTA) 60 MG tablet [Pharmacy Med Name: RALOXIFENE HCL 60 MG TABLET] 90 tablet 0    Sig: TAKE 1 TABLET BY MOUTH ONCE DAILY.     OB/GYN:  Selective Estrogen Receptor Modulators Passed - 02/11/2019  7:43 PM      Passed - Valid encounter within last 12 months    Recent Outpatient Visits          2 months ago Controlled type 2 diabetes mellitus without complication, without long-term current use of insulin [facility])   [facility] Roosvelt Maser [place], [place]   8 months ago Hypertension associated with diabetes [facility])   [facility] Particia Nearing, [place]   1 year ago Viral upper respiratory tract infection   [facility] Marjie Skiff, NP   1 year ago Annual physical exam   [facility] Particia Nearing, [place]   1 year ago Rash   [facility] Particia [place]earing, [place]      Future Appointments            In 3 months Maurice March, Salley Hews, PA-C [employer]   In 9 months Maurice March, Salley Hews, PA-C [employer]   In 9 months  [employer]           . levothyroxine (SYNTHROID) 75 MCG tablet [Pharmacy Med Name: SYNTHROID 75 MCG TABLET] 90 tablet 0    Sig: TAKE 1 TABLET BY MOUTH ONCE DAILY BEFORE BREAKFAST.     Endocrinology:  Hypothyroid Agents Failed - 02/11/2019  7:43 PM      Failed - TSH needs to be rechecked within 3 months after an abnormal result. Refill until TSH is due.      Passed - TSH in normal range and within 360 days    TSH  Date Value Ref Range Status  05/31/2018 1.510 0.450 - 4.500 uIU/mL Final         Passed - Valid encounter within last 12 months    Recent Outpatient Visits          2 months ago Controlled type 2 diabetes mellitus without complication, without long-term current use of insulin [facility])   [facility] Roosvelt Maser [place], [place]   8  months ago Hypertension associated with diabetes [facility])   [facility] Particia Nearing, [place]   1 year ago Viral upper respiratory tract infection   [facility] Marjie Skiff, NP   1 year ago Annual physical exam   [facility] Particia [place], [place]   1 year ago Rash   [facility] [place], Salley Hews, [place]      Future Appointments            In 3 months Maurice March, Salley Hews, PA-C [employer]   In 9 months Maurice March, Salley Hews, PA-C [employer]   In 9 months  [employer]

## 2019-02-26 ENCOUNTER — Other Ambulatory Visit: Payer: Self-pay | Admitting: Family Medicine

## 2019-02-26 NOTE — Telephone Encounter (Signed)
Requested Prescriptions  Pending Prescriptions Disp Refills  . quinapril (ACCUPRIL) 10 MG tablet [Pharmacy Med Name: QUINAPRIL 10 MG TABLET] 90 tablet 0    Sig: TAKE 1 TABLET BY MOUTH ONCE DAILY.     Cardiovascular:  ACE Inhibitors Failed - 02/26/2019 12:00 PM      Failed - Cr in normal range and within 180 days    Creatinine  Date Value Ref Range Status  02/23/2011 0.88 0.60 - 1.30 mg/dL Final   Creatinine, Ser  Date Value Ref Range Status  11/29/2018 1.01 (H) 0.57 - 1.00 mg/dL Final         Passed - K in normal range and within 180 days    Potassium  Date Value Ref Range Status  11/29/2018 4.2 3.5 - 5.2 mmol/L Final         Passed - Patient is not pregnant      Passed - Last BP in normal range    BP Readings from Last 1 Encounters:  11/29/18 134/66         Passed - Valid encounter within last 6 months    Recent Outpatient Visits          2 months ago Controlled type 2 diabetes mellitus without complication, without long-term current use of insulin [facility])   [facility] [employer]ney [employer], [place]   9 months ago Hypertension associated with diabetes [facility])   [facility] [employer], [place]   1 year ago Viral upper respiratory tract infection   [place], Jolene T, NP   1 year ago Annual physical exam   [facility] [employer], [place]   1 year ago [place], Lilia Argue, [place]      Future Appointments            In 3 months Orene Desanctis, Lilia Argue, [place], [place]   In 56 months Orene Desanctis, Lilia Argue, PA-C [employer], [place]   In 9 months  [employer], [place]           . pravastatin (PRAVACHOL) 40 MG tablet [Pharmacy Med Name: PRAVASTATIN SODIUM 40 MG TAB] 90 tablet 0    Sig: TAKE 1 TABLET BY MOUTH AT BEDTIME FOR CHOLESTEROL     Cardiovascular:  Antilipid - Statins Passed - 02/26/2019 12:00 PM      Passed - Total  Cholesterol in normal range and within 360 days    Cholesterol, Total  Date Value Ref Range Status  11/29/2018 126 100 - 199 mg/dL Final   Cholesterol Piccolo, Waived  Date Value Ref Range Status  05/25/2016 121 <200 mg/dL Final    Comment:                            Desirable                <200                         Borderline High      200- 239                         High                     >239          Passed - LDL in  normal range and within 360 days    LDL Chol Calc (NIH)  Date Value Ref Range Status  11/29/2018 59 0 - 99 mg/dL Final         Passed - HDL in normal range and within 360 days    HDL  Date Value Ref Range Status  11/29/2018 53 >39 mg/dL Final         Passed - Triglycerides in normal range and within 360 days    Triglycerides  Date Value Ref Range Status  11/29/2018 66 0 - 149 mg/dL Final   Triglycerides Piccolo,Waived  Date Value Ref Range Status  05/25/2016 69 <150 mg/dL Final    Comment:                            Normal                   <150                         Borderline High     150 - 199                         High                200 - 499                         Very High                >499          Passed - Patient is not pregnant      Passed - Valid encounter within last 12 months    Recent Outpatient Visits          2 months ago Controlled type 2 diabetes mellitus without complication, without long-term current use of insulin [facility])   [facility] Roosvelt Maser [place], [place]   9 months ago Hypertension associated with diabetes [facility])   [facility] Particia [facility], [place]   1 year ago Viral upper respiratory tract infection   [facility] Aura Dials T, NP   1 year ago Annual physical exam   [facility] Particia N[place], [place]   1 year ago Rash   [facility] [place], Salley Hews, [place]      Future Appointments            In 3 months Maurice March,  Salley Hews, PA-C [employer]   In 9 months Maurice March, Salley Hews, PA-C [employer]   In 9 months  [employer]           . pioglitazone (ACTOS) 30 MG tablet [Pharmacy Med Name: PIOGLITAZONE HCL 30 MG TABLET] 90 tablet 0    Sig: TAKE (1) TABLET BY MOUTH DAILY FOR DIABETES     Endocrinology:  Diabetes - Glitazones - pioglitazone Passed - 02/26/2019 12:00 PM      Passed - HBA1C is between 0 and 7.9 and within 180 days    Hemoglobin A1C  Date Value Ref Range Status  11/24/2015 7.2%  Final   HB A1C (BAYER DCA - WAIVED)  Date Value Ref Range Status  03/09/2017 6.5 <7.0 % Final    Comment:  Diabetic Adult            <7.0                                       Healthy Adult        4.3 - 5.7                                                           (DCCT/NGSP) American Diabetes Association's Summary of Glycemic Recommendations for Adults with Diabetes: Hemoglobin A1c <7.0%. More stringent glycemic goals (A1c <6.0%) may further reduce complications at the cost of increased risk of hypoglycemia.    Hgb A1c MFr Bld  Date Value Ref Range Status  11/29/2018 6.7 (H) 4.8 - 5.6 % Final    Comment:             Prediabetes: 5.7 - 6.4          Diabetes: >6.4          Glycemic control for adults with diabetes: <7.0          Passed - Valid encounter within last 6 months    Recent Outpatient Visits          2 months ago Controlled type 2 diabetes mellitus without complication, without long-term current use of insulin [facility])   [facility] Roosvelt Maser [place], [place]   9 months ago Hypertension associated with diabetes [facility])   [facility] Particia Nearing, [place]   1 year ago Viral upper respiratory tract infection   [facility] Aura Dials T, NP   1 year ago Annual physical exam   [facility] Particia [place], [place]   1 year ago  Rash   [facility] [place], Salley Hews, [place]      Future Appointments            In 3 months Maurice March, Salley Hews, PA-C [employer]   In 9 months Maurice March, Salley Hews, PA-C [employer]   In 9 months  [employer]           . glyBURIDE (DIABETA) 5 MG tablet [Pharmacy Med Name: GLYBURIDE 5 MG TABLET] 180 tablet 0    Sig: TAKE (1) TABLET TWICE A DAY WITH FOOD---BREAKFAST AND SUPPER.     Endocrinology:  Diabetes - Sulfonylureas Passed - 02/26/2019 12:00 PM      Passed - HBA1C is between 0 and 7.9 and within 180 days    Hemoglobin A1C  Date Value Ref Range Status  11/24/2015 7.2%  Final   HB A1C (BAYER DCA - WAIVED)  Date Value Ref Range Status  03/09/2017 6.5 <7.0 % Final    Comment:                                          Diabetic Adult            <7.0  Healthy Adult        4.3 - 5.7                                                           (DCCT/NGSP) American Diabetes Association's Summary of Glycemic Recommendations for Adults with Diabetes: Hemoglobin A1c <7.0%. More stringent glycemic goals (A1c <6.0%) may further reduce complications at the cost of increased risk of hypoglycemia.    Hgb A1c MFr Bld  Date Value Ref Range Status  11/29/2018 6.7 (H) 4.8 - 5.6 % Final    Comment:             Prediabetes: 5.7 - 6.4          Diabetes: >6.4          Glycemic control for adults with diabetes: <7.0          Passed - Valid encounter within last 6 months    Recent Outpatient Visits          2 months ago Controlled type 2 diabetes mellitus without complication, without long-term current use of insulin [facility])   [facility] Roosvelt Maser [place], [place]   9 months ago Hypertension associated with diabetes [facility])   [facility] Particia N[place], [place]   1 year ago Viral upper respiratory tract infection   [facility] Marjie Skiff, NP   1 year ago Annual physical exam   [facility] Particia Nearing, [place]   1 year ago Rash   [facility] [place], Salley Hews, [place]      Future Appointments            In 3 months Maurice March, Salley Hews, PA-C [employer]   In 9 months Maurice March, Salley Hews, PA-C [employer]   In 9 months  [employer]

## 2019-03-11 ENCOUNTER — Other Ambulatory Visit: Payer: Self-pay | Admitting: Family Medicine

## 2019-03-19 ENCOUNTER — Other Ambulatory Visit: Payer: Self-pay | Admitting: Family Medicine

## 2019-05-13 ENCOUNTER — Other Ambulatory Visit: Payer: Self-pay | Admitting: Family Medicine

## 2019-05-14 NOTE — Telephone Encounter (Signed)
Requested Prescriptions  Pending Prescriptions Disp Refills  . raloxifene (EVISTA) 60 MG tablet [Pharmacy Med Name: RALOXIFENE HCL 60 MG TABLET] 90 tablet 0    Sig: TAKE 1 TABLET BY MOUTH ONCE DAILY.     OB/GYN:  Selective Estrogen Receptor Modulators Passed - 05/13/2019  8:14 PM      Passed - Valid encounter within last 12 months    Recent Outpatient Visits          5 months ago Controlled type 2 diabetes mellitus without complication, without long-term current use of insulin [facility])   [facility] Roosvelt Maser [place], [place]   11 months ago Hypertension associated with diabetes [facility])   [facility] Particia Nearing, [place]   1 year ago Viral upper respiratory tract infection   [facility] Marjie Skiff, NP   1 year ago Annual physical exam   [facility] Particia [place], [place]   1 year ago Rash   [facility] Particia [place], [place]      Future Appointments            In 2 weeks Maurice March, Salley Hews, PA-C [employer]   In 6 months Maurice March, Salley Hews, PA-C [employer]   In 6 months  [employer]           . levothyroxine (SYNTHROID) 75 MCG tablet [Pharmacy Med Name: SYNTHROID 75 MCG TABLET] 90 tablet 0    Sig: TAKE 1 TABLET BY MOUTH ONCE DAILY BEFORE BREAKFAST.     Endocrinology:  Hypothyroid Agents Failed - 05/13/2019  8:14 PM      Failed - TSH needs to be rechecked within 3 months after an abnormal result. Refill until TSH is due.      Passed - TSH in normal range and within 360 days    TSH  Date Value Ref Range Status  05/31/2018 1.510 0.450 - 4.500 uIU/mL Final         Passed - Valid encounter within last 12 months    Recent Outpatient Visits          5 months ago Controlled type 2 diabetes mellitus without complication, without long-term current use of insulin [facility])   [facility] Roosvelt Maser [place], [place]   11  months ago Hypertension associated with diabetes [facility])   [facility] Particia Nearing, [place]   1 year ago Viral upper respiratory tract infection   [facility] Marjie Skiff, NP   1 year ago Annual physical exam   [facility] Particia [place], [place]   1 year ago Rash   [facility] [place], Salley Hews, [place]      Future Appointments            In 2 weeks Maurice March, Salley Hews, PA-C [facility]   In 6 months Maurice March, Salley Hews, PA-C [employer]   In 6 months  [employer]

## 2019-05-30 ENCOUNTER — Ambulatory Visit (INDEPENDENT_AMBULATORY_CARE_PROVIDER_SITE_OTHER): Payer: Medicare HMO | Admitting: Family Medicine

## 2019-05-30 ENCOUNTER — Other Ambulatory Visit: Payer: Self-pay

## 2019-05-30 ENCOUNTER — Encounter: Payer: Self-pay | Admitting: Family Medicine

## 2019-05-30 ENCOUNTER — Other Ambulatory Visit: Payer: Self-pay | Admitting: Family Medicine

## 2019-05-30 VITALS — BP 126/79 | HR 67 | Temp 97.9°F | Wt 162.0 lb

## 2019-05-30 DIAGNOSIS — E119 Type 2 diabetes mellitus without complications: Secondary | ICD-10-CM | POA: Diagnosis not present

## 2019-05-30 DIAGNOSIS — I10 Essential (primary) hypertension: Secondary | ICD-10-CM | POA: Diagnosis not present

## 2019-05-30 DIAGNOSIS — M810 Age-related osteoporosis without current pathological fracture: Secondary | ICD-10-CM

## 2019-05-30 DIAGNOSIS — E1159 Type 2 diabetes mellitus with other circulatory complications: Secondary | ICD-10-CM

## 2019-05-30 DIAGNOSIS — E782 Mixed hyperlipidemia: Secondary | ICD-10-CM | POA: Diagnosis not present

## 2019-05-30 MED ORDER — PRAVASTATIN SODIUM 40 MG PO TABS: ORAL_TABLET | ORAL | 1 refills | Status: DC

## 2019-05-30 MED ORDER — LEVOTHYROXINE SODIUM 75 MCG PO TABS: ORAL_TABLET | ORAL | 1 refills | Status: DC

## 2019-05-30 MED ORDER — RALOXIFENE HCL 60 MG PO TABS: 60.0000 mg | ORAL_TABLET | Freq: Every day | ORAL | 1 refills | Status: DC

## 2019-05-30 MED ORDER — GLYBURIDE 5 MG PO TABS: ORAL_TABLET | ORAL | 1 refills | Status: DC

## 2019-05-30 MED ORDER — PIOGLITAZONE HCL 30 MG PO TABS: ORAL_TABLET | ORAL | 1 refills | Status: DC

## 2019-05-30 MED ORDER — METFORMIN HCL ER 500 MG PO TB24: 1000.0000 mg | ORAL_TABLET | Freq: Two times a day (BID) | ORAL | 1 refills | Status: DC

## 2019-05-30 MED ORDER — QUINAPRIL HCL 10 MG PO TABS: 10.0000 mg | ORAL_TABLET | Freq: Every day | ORAL | 1 refills | Status: DC

## 2019-05-30 NOTE — Progress Notes (Signed)
BP 126/79   Pulse 67   Temp 97.9 F (36.6 C) (Oral)   Wt 162 lb (73.5 kg)   LMP  (LMP Unknown)   SpO2 99%   BMI 28.70 kg/m    Subjective:    Patient ID: Emma Berry, female    DOB: July 10, 1950, 69 y.o.   MRN: [id number]  HPI: Emma Berry is a 69 y.o. female  Chief Complaint  Patient presents with  . Diabetes  . Hypertension  . Hyperlipidemia   Patient presenting today for 6 month f/u chronic conditions. Taking medications faithfully without side effects. Staying active, eating healthy diet. Denies CP, SOB, HAs, dizziness, claudication, myalgias.   Fasting blood sugars running 70-100 range. No low blood sugar spells she's aware of.   Home BPs running 120/70s range. On pravastatin for HLD, tolerating well.   Evista and vitamin supplementation for osteoporosis. No recent fractures, falls.   Synthroid daily for hypothyroidism, asymtomatic.   Relevant past medical, surgical, family and social history reviewed and updated as indicated. Interim medical history since our last visit reviewed. Allergies and medications reviewed and updated.  Review of Systems  Per HPI unless specifically indicated above     Objective:    BP 126/79   Pulse 67   Temp 97.9 F (36.6 C) (Oral)   Wt 162 lb (73.5 kg)   LMP  (LMP Unknown)   SpO2 99%   BMI 28.70 kg/m   Wt Readings from Last 3 Encounters:  05/30/19 162 lb (73.5 kg)  11/29/18 165 lb 6.4 oz (75 kg)  11/29/18 165 lb 6.4 oz (75 kg)    Physical Exam Vitals and nursing note reviewed.  Constitutional:      Appearance: Normal appearance. She is not ill-appearing.  HENT:     Head: Atraumatic.  Eyes:     Extraocular Movements: Extraocular movements intact.     Conjunctiva/sclera: Conjunctivae normal.  Cardiovascular:     Rate and Rhythm: Normal rate and regular rhythm.     Heart sounds: Normal heart sounds.  Pulmonary:     Effort: Pulmonary effort is normal.     Breath sounds: Normal breath sounds.  Musculoskeletal:          General: Normal range of motion.     Cervical back: Normal range of motion and neck supple.  Skin:    General: Skin is warm and dry.  Neurological:     Mental Status: She is alert and oriented to person, place, and time.  Psychiatric:        Mood and Affect: Mood normal.        Thought Content: Thought content normal.        Judgment: Judgment normal.     Results for orders placed or performed in visit on 05/30/19  Comprehensive metabolic panel  Result Value Ref Range   Glucose 117 (H) 65 - 99 mg/dL   BUN 20 8 - 27 mg/dL   Creatinine, Ser 1.00 0.57 - 1.00 mg/dL   GFR calc non Af Amer 57 (L) >59 mL/min/1.73   GFR calc Af Amer 65 >59 mL/min/1.73   BUN/Creatinine Ratio 20 12 - 28   Sodium 141 134 - 144 mmol/L   Potassium 4.9 3.5 - 5.2 mmol/L   Chloride 103 96 - 106 mmol/L   CO2 24 20 - 29 mmol/L   Calcium 9.2 8.7 - 10.3 mg/dL   Total Protein 6.5 6.0 - 8.5 g/dL   Albumin 4.1 3.7 - 4.7 g/dL  Globulin, Total 2.4 1.5 - 4.5 g/dL   Albumin/Globulin Ratio 1.7 1.2 - 2.2   Bilirubin Total 0.3 0.0 - 1.2 mg/dL   Alkaline Phosphatase 75 48 - 121 IU/L   AST 16 0 - 40 IU/L   ALT 10 0 - 32 IU/L  Lipid Panel w/o Chol/HDL Ratio  Result Value Ref Range   Cholesterol, Total 120 100 - 199 mg/dL   Triglycerides 75 0 - 149 mg/dL   HDL 48 >75 mg/dL   VLDL Cholesterol Cal 15 5 - 40 mg/dL   LDL Chol Calc (NIH) 57 0 - 99 mg/dL  HgB Z0C  Result Value Ref Range   Hgb A1c MFr Bld 6.8 (H) 4.8 - 5.6 %   Est. average glucose Bld gHb Est-mCnc 148 mg/dL      Assessment & Plan:   Problem List Items Addressed This Visit      Cardiovascular and Mediastinum   Hypertension associated with diabetes (HCC)    BPs stable and under good control, continue current regimen      Relevant Medications   quinapril (ACCUPRIL) 10 MG tablet   pravastatin (PRAVACHOL) 40 MG tablet   pioglitazone (ACTOS) 30 MG tablet   metFORMIN (GLUCOPHAGE-XR) 500 MG 24 hr tablet   glyBURIDE (DIABETA) 5 MG tablet   Other  Relevant Orders   Comprehensive metabolic panel (Completed)     Endocrine   Controlled type 2 diabetes mellitus without complication, without long-term current use of insulin (HCC) - Primary    Recheck A1C, adjust as needed. Continue current regimen and good lifestyle habits      Relevant Medications   quinapril (ACCUPRIL) 10 MG tablet   pravastatin (PRAVACHOL) 40 MG tablet   pioglitazone (ACTOS) 30 MG tablet   metFORMIN (GLUCOPHAGE-XR) 500 MG 24 hr tablet   glyBURIDE (DIABETA) 5 MG tablet   Other Relevant Orders   Comprehensive metabolic panel (Completed)   HgB A1c (Completed)     Musculoskeletal and Integument   Osteoporosis    Stable, continue current regimen      Relevant Medications   raloxifene (EVISTA) 60 MG tablet     Other   Hyperlipidemia    Recheck lipids, adjust as needed. Continue current regimen      Relevant Medications   quinapril (ACCUPRIL) 10 MG tablet   pravastatin (PRAVACHOL) 40 MG tablet   Other Relevant Orders   Lipid Panel w/o Chol/HDL Ratio (Completed)       Follow up plan: Return in about 6 months (around 11/30/2019) for CPE.

## 2019-05-31 LAB — COMPREHENSIVE METABOLIC PANEL
ALT: 10 IU/L (ref 0–32)
AST: 16 IU/L (ref 0–40)
Albumin/Globulin Ratio: 1.7 (ref 1.2–2.2)
Albumin: 4.1 g/dL (ref 3.7–4.7)
Alkaline Phosphatase: 75 IU/L (ref 48–121)
BUN/Creatinine Ratio: 20 (ref 12–28)
BUN: 20 mg/dL (ref 8–27)
Bilirubin Total: 0.3 mg/dL (ref 0.0–1.2)
CO2: 24 mmol/L (ref 20–29)
Calcium: 9.2 mg/dL (ref 8.7–10.3)
Chloride: 103 mmol/L (ref 96–106)
Creatinine, Ser: 1 mg/dL (ref 0.57–1.00)
GFR calc Af Amer: 65 mL/min/{1.73_m2} (ref >59)
GFR calc non Af Amer: 57 mL/min/{1.73_m2} — ABNORMAL LOW (ref >59)
Globulin, Total: 2.4 g/dL (ref 1.5–4.5)
Glucose: 117 mg/dL — ABNORMAL HIGH (ref 65–99)
Potassium: 4.9 mmol/L (ref 3.5–5.2)
Sodium: 141 mmol/L (ref 134–144)
Total Protein: 6.5 g/dL (ref 6.0–8.5)

## 2019-05-31 LAB — HEMOGLOBIN A1C
Est. average glucose Bld gHb Est-mCnc: 148 mg/dL
Hgb A1c MFr Bld: 6.8 % — ABNORMAL HIGH (ref 4.8–5.6)

## 2019-05-31 LAB — LIPID PANEL W/O CHOL/HDL RATIO
Cholesterol, Total: 120 mg/dL (ref 100–199)
HDL: 48 mg/dL (ref >39)
LDL Chol Calc (NIH): 57 mg/dL (ref 0–99)
Triglycerides: 75 mg/dL (ref 0–149)
VLDL Cholesterol Cal: 15 mg/dL (ref 5–40)

## 2019-05-31 NOTE — Assessment & Plan Note (Signed)
BPs stable and under good control, continue current regimen 

## 2019-05-31 NOTE — Assessment & Plan Note (Signed)
Stable, continue current regimen 

## 2019-05-31 NOTE — Assessment & Plan Note (Signed)
Recheck lipids, adjust as needed. Continue current regimen 

## 2019-05-31 NOTE — Assessment & Plan Note (Signed)
Recheck A1C, adjust as needed. Continue current regimen and good lifestyle habits 

## 2019-08-20 ENCOUNTER — Encounter: Payer: Self-pay | Admitting: Family Medicine

## 2019-10-08 ENCOUNTER — Other Ambulatory Visit: Payer: Self-pay | Admitting: Family Medicine

## 2019-10-08 NOTE — Telephone Encounter (Signed)
Requested medication (s) are due for refill today: yes  Requested medication (s) are on the active medication list:yes  Last refill:  05/30/19  #90 1 refill  Future visit scheduled: yes  Notes to clinic:  Passed protocol but is Roosvelt Maser PA patient. Appointment next month with Aura Dials NP    Requested Prescriptions  Pending Prescriptions Disp Refills   quinapril (ACCUPRIL) 10 MG tablet [Pharmacy Med Name: QUINAPRIL 10 MG TABLET] 90 tablet 3    Sig: TAKE 1 TABLET BY MOUTH ONCE DAILY.      Cardiovascular:  ACE Inhibitors Passed - 10/08/2019 10:18 AM      Passed - Cr in normal range and within 180 days    Creatinine  Date Value Ref Range Status  02/23/2011 0.88 0.60 - 1.30 mg/dL Final   Creatinine, Ser  Date Value Ref Range Status  05/30/2019 1.00 0.57 - 1.00 mg/dL Final          Passed - K in normal range and within 180 days    Potassium  Date Value Ref Range Status  05/30/2019 4.9 3.5 - 5.2 mmol/L Final          Passed - Patient is not pregnant      Passed - Last BP in normal range    BP Readings from Last 1 Encounters:  05/30/19 126/79          Passed - Valid encounter within last 6 months    Recent Outpatient Visits           4 months ago Controlled type 2 diabetes mellitus without complication, without long-term current use of insulin [facility])   [facility] [place], [place], [place]   10 months ago Controlled type 2 diabetes mellitus without complication, without long-term current use of insulin [facility])   [facility] [place], [place], [place]   1 year ago Hypertension associated with diabetes [facility])   [facility] Particia Nearing, [place]   1 year ago Viral upper respiratory tract infection   [facility] [place], Corrie Dandy T, NP   1 year ago Annual physical exam   [facility] Particia Nearing, [place]       Future Appointments             In 1 month  [facility],  [facility]   In 1 month [place], Dorie Rank, NP [employer]

## 2019-10-29 DIAGNOSIS — H25013 Cortical age-related cataract, bilateral: Secondary | ICD-10-CM | POA: Diagnosis not present

## 2019-10-29 DIAGNOSIS — H524 Presbyopia: Secondary | ICD-10-CM | POA: Diagnosis not present

## 2019-10-29 LAB — HM DIABETES EYE EXAM

## 2019-11-07 DIAGNOSIS — Z0100 Encounter for examination of eyes and vision without abnormal findings: Secondary | ICD-10-CM | POA: Diagnosis not present

## 2019-11-22 ENCOUNTER — Encounter: Payer: Self-pay | Admitting: Nurse Practitioner

## 2019-11-25 ENCOUNTER — Other Ambulatory Visit: Payer: Self-pay | Admitting: Family Medicine

## 2019-11-25 NOTE — Telephone Encounter (Signed)
Requested Prescriptions  Pending Prescriptions Disp Refills  . pravastatin (PRAVACHOL) 40 MG tablet [Pharmacy Med Name: PRAVASTATIN SODIUM 40 MG TAB] 90 tablet 0    Sig: TAKE 1 TABLET BY MOUTH AT BEDTIME FOR CHOLESTEROL     Cardiovascular:  Antilipid - Statins Failed - 11/25/2019  4:26 PM      Failed - LDL in normal range and within 360 days    LDL Chol Calc (NIH)  Date Value Ref Range Status  05/30/2019 57 0 - 99 mg/dL Final         Passed - Total Cholesterol in normal range and within 360 days    Cholesterol, Total  Date Value Ref Range Status  05/30/2019 120 100 - 199 mg/dL Final   Cholesterol Piccolo, Waived  Date Value Ref Range Status  05/25/2016 121 <200 mg/dL Final    Comment:                            Desirable                <200                         Borderline High      200- 239                         High                     >239          Passed - HDL in normal range and within 360 days    HDL  Date Value Ref Range Status  05/30/2019 48 >39 mg/dL Final         Passed - Triglycerides in normal range and within 360 days    Triglycerides  Date Value Ref Range Status  05/30/2019 75 0 - 149 mg/dL Final   Triglycerides Piccolo,Waived  Date Value Ref Range Status  05/25/2016 69 <150 mg/dL Final    Comment:                            Normal                   <150                         Borderline High     150 - 199                         High                200 - 499                         Very High                >499          Passed - Patient is not pregnant      Passed - Valid encounter within last 12 months    Recent Outpatient Visits          5 months ago Controlled type 2 diabetes mellitus without complication, without long-term current use of insulin [facility])   [facility] [place], [place], [place]   12 months  ago Controlled type 2 diabetes mellitus without complication, without long-term current use of insulin [facility](HCC)   [facility] [place], [place]   1 year ago Hypertension associated with diabetes [facility](HCC)   [facility] [address] [place], [place], [place]   1 year ago Viral upper respiratory tract infection   [facility] [place], Dorie RankJolene T, NP   1 year ago Annual physical exam   [facility] [place], [place], PA-C      Future Appointments            In 5 days  [facility]   In 1 month Cannady, Dorie RankJolene T, NP [facility]           . glyBURIDE (DIABETA) 5 MG tablet [Pharmacy Med Name: GLYBURIDE 5 MG TABLET] 180 tablet 0    Sig: TAKE (1) TABLET TWICE A DAY WITH FOOD---BREAKFAST AND SUPPER.     Endocrinology:  Diabetes - Sulfonylureas Passed - 11/25/2019  4:26 PM      Passed - HBA1C is between 0 and 7.9 and within 180 days    Hemoglobin A1C  Date Value Ref Range Status  11/24/2015 7.2%  Final   HB A1C (BAYER DCA - WAIVED)  Date Value Ref Range Status  03/09/2017 6.5 <7.0 % Final    Comment:                                          Diabetic Adult            <7.0                                       Healthy Adult        4.3 - 5.7                                                           (DCCT/NGSP) American Diabetes Association's Summary of Glycemic Recommendations for Adults with Diabetes: Hemoglobin A1c <7.0%. More stringent glycemic goals (A1c <6.0%) may further reduce complications at the cost of increased risk of hypoglycemia.    Hgb A1c MFr Bld  Date Value Ref Range Status  05/30/2019 6.8 (H) 4.8 - 5.6 % Final    Comment:             Prediabetes: 5.7 - 6.4          Diabetes: >6.4          Glycemic control for adults with diabetes: <7.0          Passed - Valid encounter within last 6 months    Recent Outpatient Visits          5 months ago Controlled type 2 diabetes mellitus without complication, without long-term current use of insulin [facility](HCC)   [facility] [address], [place]   12 months ago Controlled type 2 diabetes mellitus without complication, without long-term current use of insulin [facility](HCC)   [facility] [place], [place]   1 year ago Hypertension associated with diabetes [facility](HCC)   [facility] [place], Rachel  Lanora Manis, PA-C   1 year ago Viral upper respiratory tract infection   [facility] [place], Dorie Rank, NP   1 year ago Annual physical exam   [facility] Part[place], [place]      Future Appointments            In 5 days  [facility], [facility]   In 1 month [place], Dorie Rank, NP [employer], [facility]           . pioglitazone (ACTOS) 30 MG tablet [Pharmacy Med Name: PIOGLITAZONE HCL 30 MG TABLET] 90 tablet 0    Sig: TAKE (1) TABLET BY MOUTH DAILY FOR DIABETES     Endocrinology:  Diabetes - Glitazones - pioglitazone Passed - 11/25/2019  4:26 PM      Passed - HBA1C is between 0 and 7.9 and within 180 days    Hemoglobin A1C  Date Value Ref Range Status  11/24/2015 7.2%  Final   HB A1C (BAYER DCA - WAIVED)  Date Value Ref Range Status  03/09/2017 6.5 <7.0 % Final    Comment:                                          Diabetic Adult            <7.0                                       Healthy Adult        4.3 - 5.7                                                           (DCCT/NGSP) American Diabetes Association's Summary of Glycemic Recommendations for Adults with Diabetes: Hemoglobin A1c <7.0%. More stringent glycemic goals (A1c <6.0%) may further reduce complications at the cost of increased risk of hypoglycemia.    Hgb A1c MFr Bld  Date Value Ref Range Status  05/30/2019 6.8 (H) 4.8 - 5.6 % Final    Comment:             Prediabetes: 5.7 - 6.4          Diabetes: >6.4          Glycemic control for adults with diabetes: <7.0          Passed - Valid encounter within last 6 months    Recent Outpatient Visits          5 months ago Controlled type 2  diabetes mellitus without complication, without long-term current use of insulin [facility])   [facility], [place], [place]   12 months ago Controlled type 2 diabetes mellitus without complication, without long-term current use of insulin [facility])   [facility] [place], [place], [place]   1 year ago Hypertension associated with diabetes [facility])   [facility] Particia Nearing, [place]   1 year ago Viral upper respiratory tract infection   [facility] Marjie Skiff, NP   1 year ago Annual physical exam   [facility] Part[place], [place]  Future Appointments            In 5 days  [facility], [facility]   In 1 month Cannady, Dorie Rank, NP [employer] [facility], [facility]

## 2019-11-26 ENCOUNTER — Other Ambulatory Visit: Payer: Self-pay | Admitting: Family Medicine

## 2019-11-30 ENCOUNTER — Ambulatory Visit (INDEPENDENT_AMBULATORY_CARE_PROVIDER_SITE_OTHER): Payer: Medicare HMO

## 2019-11-30 VITALS — Ht 63.0 in | Wt 159.0 lb

## 2019-11-30 DIAGNOSIS — Z0000 Encounter for general adult medical examination without abnormal findings: Secondary | ICD-10-CM

## 2019-11-30 NOTE — Patient Instructions (Signed)
Emma Berry , Thank you for taking time to come for your Medicare Wellness Visit. I appreciate your ongoing commitment to your health goals. Please review the following plan we discussed and let me know if I can assist you in the future.   Screening recommendations/referrals: Colonoscopy: completed 07/01/2017 Mammogram: completed 12/21/2018, due 12/21/2019 Bone Density: completed 06/25/2015 Recommended yearly ophthalmology/optometry visit for glaucoma screening and checkup Recommended yearly dental visit for hygiene and checkup  Vaccinations: Influenza vaccine: completed 11/06/2019, due 08/11/2020 Pneumococcal vaccine: completed 11/18/2014 Tdap vaccine: completed 11/18/2014, due 11/17/2024 Shingles vaccine: discussed   Covid-19: 03/07/2019, 02/07/2019, 11/13/2019  Advanced directives: Advance directive discussed with you today. Even though you declined this today please call our office should you change your mind and we can give you the proper paperwork for you to fill out.  Conditions/risks identified: none  Next appointment: Follow up in one year for your annual wellness visit    Preventive Care 65 Years and Older, Female Preventive care refers to lifestyle choices and visits with your health care provider that can promote health and wellness. What does preventive care include?  A yearly physical exam. This is also called an annual well check.  Dental exams once or twice a year.  Routine eye exams. Ask your health care provider how often you should have your eyes checked.  Personal lifestyle choices, including:  Daily care of your teeth and gums.  Regular physical activity.  Eating a healthy diet.  Avoiding tobacco and drug use.  Limiting alcohol use.  Practicing safe sex.  Taking low-dose aspirin every day.  Taking vitamin and mineral supplements as recommended by your health care provider. What happens during an annual well check? The services and screenings done by your  health care provider during your annual well check will depend on your age, overall health, lifestyle risk factors, and family history of disease. Counseling  Your health care provider may ask you questions about your:  Alcohol use.  Tobacco use.  Drug use.  Emotional well-being.  Home and relationship well-being.  Sexual activity.  Eating habits.  History of falls.  Memory and ability to understand (cognition).  Work and work Astronomer.  Reproductive health. Screening  You may have the following tests or measurements:  Height, weight, and BMI.  Blood pressure.  Lipid and cholesterol levels. These may be checked every 5 years, or more frequently if you are over 10 years old.  Skin check.  Lung cancer screening. You may have this screening every year starting at age 41 if you have a 30-pack-year history of smoking and currently smoke or have quit within the past 15 years.  Fecal occult blood test (FOBT) of the stool. You may have this test every year starting at age 24.  Flexible sigmoidoscopy or colonoscopy. You may have a sigmoidoscopy every 5 years or a colonoscopy every 10 years starting at age 40.  Hepatitis C blood test.  Hepatitis B blood test.  Sexually transmitted disease (STD) testing.  Diabetes screening. This is done by checking your blood sugar (glucose) after you have not eaten for a while (fasting). You may have this done every 1-3 years.  Bone density scan. This is done to screen for osteoporosis. You may have this done starting at age 2.  Mammogram. This may be done every 1-2 years. Talk to your health care provider about how often you should have regular mammograms. Talk with your health care provider about your test results, treatment options, and if necessary, the  need for more tests. Vaccines  Your health care provider may recommend certain vaccines, such as:  Influenza vaccine. This is recommended every year.  Tetanus, diphtheria, and  acellular pertussis (Tdap, Td) vaccine. You may need a Td booster every 10 years.  Zoster vaccine. You may need this after age 43.  Pneumococcal 13-valent conjugate (PCV13) vaccine. One dose is recommended after age 82.  Pneumococcal polysaccharide (PPSV23) vaccine. One dose is recommended after age 25. Talk to your health care provider about which screenings and vaccines you need and how often you need them. This information is not intended to replace advice given to you by your health care provider. Make sure you discuss any questions you have with your health care provider. Document Released: 01/24/2015 Document Revised: 09/17/2015 Document Reviewed: 10/29/2014 Elsevier Interactive Patient Education  2017 [employer] Prevention in the Home Falls can cause injuries. They can happen to people of all ages. There are many things you can do to make your home safe and to help prevent falls. What can I do on the outside of my home?  Regularly fix the edges of walkways and driveways and fix any cracks.  Remove anything that might make you trip as you walk through a door, such as a raised step or threshold.  Trim any bushes or trees on the path to your home.  Use bright outdoor lighting.  Clear any walking paths of anything that might make someone trip, such as rocks or tools.  Regularly check to see if handrails are loose or broken. Make sure that both sides of any steps have handrails.  Any raised decks and porches should have guardrails on the edges.  Have any leaves, snow, or ice cleared regularly.  Use sand or salt on walking paths during winter.  Clean up any spills in your garage right away. This includes oil or grease spills. What can I do in the bathroom?  Use night lights.  Install grab bars by the toilet and in the tub and shower. Do not use towel bars as grab bars.  Use non-skid mats or decals in the tub or shower.  If you need to sit down in the shower, use  a plastic, non-slip stool.  Keep the floor dry. Clean up any water that spills on the floor as soon as it happens.  Remove soap buildup in the tub or shower regularly.  Attach bath mats securely with double-sided non-slip rug tape.  Do not have throw rugs and other things on the floor that can make you trip. What can I do in the bedroom?  Use night lights.  Make sure that you have a light by your bed that is easy to reach.  Do not use any sheets or blankets that are too big for your bed. They should not hang down onto the floor.  Have a firm chair that has side arms. You can use this for support while you get dressed.  Do not have throw rugs and other things on the floor that can make you trip. What can I do in the kitchen?  Clean up any spills right away.  Avoid walking on wet floors.  Keep items that you use a lot in easy-to-reach places.  If you need to reach something above you, use a strong step stool that has a grab bar.  Keep electrical cords out of the way.  Do not use floor polish or wax that makes floors slippery. If you must use wax,  use non-skid floor wax.  Do not have throw rugs and other things on the floor that can make you trip. What can I do with my stairs?  Do not leave any items on the stairs.  Make sure that there are handrails on both sides of the stairs and use them. Fix handrails that are broken or loose. Make sure that handrails are as long as the stairways.  Check any carpeting to make sure that it is firmly attached to the stairs. Fix any carpet that is loose or worn.  Avoid having throw rugs at the top or bottom of the stairs. If you do have throw rugs, attach them to the floor with carpet tape.  Make sure that you have a light switch at the top of the stairs and the bottom of the stairs. If you do not have them, ask someone to add them for you. What else can I do to help prevent falls?  Wear shoes that:  Do not have high heels.  Have  rubber bottoms.  Are comfortable and fit you well.  Are closed at the toe. Do not wear sandals.  If you use a stepladder:  Make sure that it is fully opened. Do not climb a closed stepladder.  Make sure that both sides of the stepladder are locked into place.  Ask someone to hold it for you, if possible.  Clearly mark and make sure that you can see:  Any grab bars or handrails.  First and last steps.  Where the edge of each step is.  Use tools that help you move around (mobility aids) if they are needed. These include:  Canes.  Walkers.  Scooters.  Crutches.  Turn on the lights when you go into a dark area. Replace any light bulbs as soon as they burn out.  Set up your furniture so you have a clear path. Avoid moving your furniture around.  If any of your floors are uneven, fix them.  If there are any pets around you, be aware of where they are.  Review your medicines with your doctor. Some medicines can make you feel dizzy. This can increase your chance of falling. Ask your doctor what other things that you can do to help prevent falls. This information is not intended to replace advice given to you by your health care provider. Make sure you discuss any questions you have with your health care provider. Document Released: 10/24/2008 Document Revised: 06/05/2015 Document Reviewed: 02/01/2014 Elsevier Interactive Patient Education  2017 [employer].

## 2019-11-30 NOTE — Progress Notes (Signed)
I connected with Emma Berry today by telephone and verified that I am speaking with the correct person using two identifiers. Location patient: home Location provider: work Persons participating in the virtual visit: Emma Berry, [place] LPN.   I discussed the limitations, risks, security and privacy concerns of performing an evaluation and management service by telephone and the availability of in person appointments. I also discussed with the patient that there may be a patient responsible charge related to this service. The patient expressed understanding and verbally consented to this telephonic visit.    Interactive audio and video telecommunications were attempted between this provider and patient, however failed, due to patient having technical difficulties OR patient did not have access to video capability.  We continued and completed visit with audio only.     Vital signs may be patient reported or missing.  Subjective:   Emma Berry is a 69 y.o. female who presents for Medicare Annual (Subsequent) preventive examination.  Review of Systems     Cardiac Risk Factors include: advanced age (>76men, >3 women);diabetes mellitus;hypertension     Objective:    Today's Vitals   11/30/19 0856  Weight: 159 lb (72.1 kg)  Height: 5\' 3"  (1.6 m)   Body mass index is 28.17 kg/m.  Advanced Directives 11/30/2019 11/29/2018 11/24/2017 10/31/2017 10/04/2017 05/03/2017 02/24/2016  Does Patient Have a Medical Advance Directive? No Yes No No No No No  Type of Advance Directive - Living will;Healthcare Power of Attorney - - - - -  Copy of Healthcare Power of Attorney in Chart? - Yes - validated most recent copy scanned in chart (See row information) - - - - -  Would patient like information on creating a medical advance directive? - - Yes (MAU/Ambulatory/Procedural Areas - Information given) No - Patient declined No - Patient declined No - Patient declined -    Current Medications  (verified) Outpatient Encounter Medications as of 11/30/2019  Medication Sig  . aspirin EC 81 MG tablet Take 81 mg by mouth at bedtime.  . calcium carbonate (CALCIUM 600) 600 MG TABS tablet Take 600 mg by mouth at bedtime.  . Cholecalciferol (VITAMIN D3) 5000 units CAPS Take 1 capsule by mouth daily.  . ferrous gluconate (FERGON) 225 (27 Fe) MG tablet Take 240 mg by mouth daily.  12/02/2019 glyBURIDE (DIABETA) 5 MG tablet TAKE (1) TABLET TWICE A DAY WITH FOOD---BREAKFAST AND SUPPER.  Marland Kitchen levothyroxine (SYNTHROID) 75 MCG tablet TAKE 1 TABLET BY MOUTH ONCE DAILY BEFORE BREAKFAST.  Marland Kitchen liraglutide (VICTOZA) 18 MG/3ML SOPN INJECT 1.8MG  S.Q. ONCE DAILY.  . metFORMIN (GLUCOPHAGE-XR) 500 MG 24 hr tablet Take 2 tablets (1,000 mg total) by mouth in the morning and at bedtime.  . ONE TOUCH ULTRA TEST test strip CHECK BLOOD SUGAR ONCE DAILY  . pioglitazone (ACTOS) 30 MG tablet TAKE (1) TABLET BY MOUTH DAILY FOR DIABETES  . pravastatin (PRAVACHOL) 40 MG tablet TAKE 1 TABLET BY MOUTH AT BEDTIME FOR CHOLESTEROL  . quinapril (ACCUPRIL) 10 MG tablet TAKE 1 TABLET BY MOUTH ONCE DAILY.  . raloxifene (EVISTA) 60 MG tablet Take 1 tablet (60 mg total) by mouth daily.  . vitamin B-12 (CYANOCOBALAMIN) 1000 MCG tablet Take 1,000 mcg by mouth at bedtime.   No facility-administered encounter medications on file as of 11/30/2019.    Allergies (verified) Patient has no known allergies.   History: Past Medical History:  Diagnosis Date  . Chronic kidney disease   . Diabetes mellitus without complication (HCC)   . Hyperlipidemia   .  Hypertension   . Hypothyroidism   . Osteopenia   . Osteoporosis   . Thyroid disease    Past Surgical History:  Procedure Laterality Date  . CESAREAN SECTION    . COLONOSCOPY WITH PROPOFOL N/A 05/03/2017   Procedure: COLONOSCOPY WITH PROPOFOL;  Surgeon: Christena DeemSkulskie, Martin U, MD;  [facility] END[facility]COPY;  Service: Endoscopy;  Laterality: N/A;  . VENTRAL HERNIA REPAIR N/A 10/31/2017    Procedure: LAPAROSCOPIC VENTRAL HERNIA;  Surgeon: Carolan Shiverintron-Diaz, Edgardo, MD;  Location: [facility] ORS;  Service: General;  Laterality: N/A;  . VENTRAL HERNIA REPAIR N/A 10/31/2017   Procedure: HERNIA REPAIR VENTRAL ADULT;  Surgeon: Carolan Shiverintron-Diaz, Edgardo, MD;  Location: [facility] ORS;  Service: General;  Laterality: N/A;   Family History  Problem Relation Age of Onset  . Cancer Mother 3075       melanoma  . Diabetes Mother   . Thyroid disease Mother   . Cancer Father 5570       prostate to bone  . Diabetes Daughter   . Heart disease Maternal Grandfather   . Stroke Paternal Grandmother   . Stroke Paternal Grandfather   . Breast cancer Neg Hx    Social History   Socioeconomic History  . Marital status: Married    Spouse name: Not on file  . Number of children: Not on file  . Years of education: Not on file  . Highest education level: Not on file  Occupational History  . Occupation: retired  Tobacco Use  . Smoking status: Never Smoker  . Smokeless tobacco: Never Used  Vaping Use  . Vaping Use: Never used  Substance and Sexual Activity  . Alcohol use: No    Alcohol/week: 0.0 standard drinks  . Drug use: No  . Sexual activity: Yes  Other Topics Concern  . Not on file  Social History Narrative  . Not on file   Social Determinants of Health   Financial Resource Strain: Low Risk   . Difficulty of Paying Living Expenses: Not hard at all  Food Insecurity: No Food Insecurity  . Worried About Programme researcher, broadcasting/film/videounning Out of Food in the Last Year: Never true  . Ran Out of Food in the Last Year: Never true  Transportation Needs: No Transportation Needs  . Lack of Transportation (Medical): No  . Lack of Transportation (Non-Medical): No  Physical Activity: Inactive  . Days of Exercise per Week: 0 days  . Minutes of Exercise per Session: 0 min  Stress: No Stress Concern Present  . Feeling of Stress : Not at all  Social Connections:   . Frequency of Communication with Friends and Family: Not on file  .  Frequency of Social Gatherings with Friends and Family: Not on file  . Attends Religious Services: Not on file  . Active Member of Clubs or Organizations: Not on file  . Attends BankerClub or Organization Meetings: Not on file  . Marital Status: Not on file    Tobacco Counseling Counseling given: Not Answered   Clinical Intake:  Pre-visit preparation completed: Yes  Pain : No/denies pain     Nutritional Status: BMI 25 -29 Overweight Nutritional Risks: None Diabetes: Yes  How often do you need to have someone help you when you read instructions, pamphlets, or other written materials from your doctor or pharmacy?: 1 - Never What is the last grade level you completed in school?: some college  Diabetic? Yes Nutrition Risk Assessment:  Has the patient had any N/V/D within the last 2 months?  No  Does  the patient have any non-healing wounds?  No  Has the patient had any unintentional weight loss or weight gain?  No   Diabetes:  Is the patient diabetic?  Yes  If diabetic, was a CBG obtained today?  No  Did the patient bring in their glucometer from home?  No  How often do you monitor your CBG's? daily.   Financial Strains and Diabetes Management:  Are you having any financial strains with the device, your supplies or your medication? No .  Does the patient want to be seen by Chronic Care Management for management of their diabetes?  No  Would the patient like to be referred to a Nutritionist or for Diabetic Management?  No   Diabetic Exams:  Diabetic Eye Exam: Completed 2021 Diabetic Foot Exam: Overdue, Pt has been advised about the importance in completing this exam. Pt is scheduled for diabetic foot exam on next appointment.  Interpreter Needed?: No  Information entered by :: NAllen LPN   Activities of Daily Living In your present state of health, do you have any difficulty performing the following activities: 11/30/2019  Hearing? Y  Comment right ear seems stopped up    Vision? N  Difficulty concentrating or making decisions? N  Walking or climbing stairs? N  Dressing or bathing? N  Doing errands, shopping? N  Preparing Food and eating ? N  Using the Toilet? N  In the past six months, have you accidently leaked urine? N  Do you have problems with loss of bowel control? N  Managing your Medications? N  Managing your Finances? N  Housekeeping or managing your Housekeeping? N  Some recent data might be hidden    Patient Care Team: Particia Nearing, PA-C as PCP - General (Family Medicine)  Indicate any recent Medical Services you may have received from other than [facility] providers in the past year (date may be approximate).     Assessment:   This is a routine wellness examination for Zayli.  Hearing/Vision screen  Hearing Screening   125Hz  250Hz  500Hz  1000Hz  2000Hz  3000Hz  4000Hz  6000Hz  8000Hz   Right ear:           Left ear:           Vision Screening Comments: Regular eye exams, [facility]  Dietary issues and exercise activities discussed: Current Exercise Habits: The patient does not participate in regular exercise at present  Goals    . Patient Stated     11/30/2019, drink more water      Depression Screen PHQ 2/9 Scores 11/30/2019 11/29/2018 05/30/2018 11/24/2017 11/23/2016 11/24/2015 11/18/2014  PHQ - 2 Score 0 0 0 0 0 0 0  PHQ- 9 Score - - - - 0 0 -    Fall Risk Fall Risk  11/30/2019 11/29/2018 11/24/2017 11/23/2016 11/24/2015  Falls in the past year? 0 0 0 No No  Number falls in past yr: - 0 0 - -  Injury with Fall? - 0 0 - -  Risk for fall due to : Medication side effect - - - -  Follow up Falls evaluation completed;Education provided;Falls prevention discussed - - - -    Any stairs in or around the home? Yes  If so, are there any without handrails? No  Home free of loose throw rugs in walkways, pet beds, electrical cords, etc? Yes  Adequate lighting in your home to reduce risk of falls? Yes   ASSISTIVE DEVICES  UTILIZED TO PREVENT FALLS:  Life alert? No  Use of a cane, walker or w/c? No  Grab bars in the bathroom? Yes  Shower chair or bench in shower? Yes  Elevated toilet seat or a handicapped toilet? Yes   TIMED UP AND GO:  Was the test performed? No .  .    Cognitive Function:     6CIT Screen 11/30/2019 11/29/2018 11/24/2017  What Year? 0 points 0 points 0 points  What month? 0 points 0 points 0 points  What time? 0 points 0 points 0 points  Count back from 20 0 points 0 points 0 points  Months in reverse 0 points 0 points 0 points  Repeat phrase 0 points 0 points 2 points  Total Score 0 0 2    Immunizations Immunization History  Administered Date(s) Administered  . Influenza, High Dose Seasonal PF 11/24/2015, 11/23/2016, 11/24/2017  . Influenza,inj,Quad PF,6+ Mos 11/18/2014  . Influenza-Unspecified 10/30/2018  . Moderna SARS-COVID-2 Vaccination 02/07/2019, 03/07/2019  . Pneumococcal Conjugate-13 11/18/2014  . Pneumococcal Polysaccharide-23 04/16/2013  . Td 08/19/2004  . Tdap 11/18/2014  . Zoster 10/01/2009    TDAP status: Up to date Flu Vaccine status: Up to date Pneumococcal vaccine status: Up to date Covid-19 vaccine status: Completed vaccines  Qualifies for Shingles Vaccine? Yes   Zostavax completed Yes   Shingrix Completed?: No.    Education has been provided regarding the importance of this vaccine. Patient has been advised to call insurance company to determine out of pocket expense if they have not yet received this vaccine. Advised may also receive vaccine at local pharmacy or Health Dept. Verbalized acceptance and understanding.  Screening Tests Health Maintenance  Topic Date Due  . OPHTHALMOLOGY EXAM  03/07/2019  . INFLUENZA VACCINE  08/12/2019  . FOOT EXAM  11/29/2019  . HEMOGLOBIN A1C  11/30/2019  . MAMMOGRAM  12/21/2019  . TETANUS/TDAP  11/17/2024  . COLONOSCOPY  05/04/2027  . DEXA SCAN  Completed  . COVID-19 Vaccine  Completed  . Hepatitis C  Screening  Completed  . PNA vac Low Risk Adult  Completed    Health Maintenance  Health Maintenance Due  Topic Date Due  . OPHTHALMOLOGY EXAM  03/07/2019  . INFLUENZA VACCINE  08/12/2019  . FOOT EXAM  11/29/2019  . HEMOGLOBIN A1C  11/30/2019    Colorectal cancer screening: Completed 07/01/2017. Repeat every 10 years Mammogram status: Completed 12/21/2018. Repeat every year Bone Density status: Completed 06/25/2015.   Lung Cancer Screening: (Low Dose CT Chest recommended if Age 50-80 years, 30 pack-year currently smoking OR have quit w/in 15years.) does not qualify.   Lung Cancer Screening Referral: no  Additional Screening:  Hepatitis C Screening: does qualify; Completed 11/18/2014  Vision Screening: Recommended annual ophthalmology exams for early detection of glaucoma and other disorders of the eye. Is the patient up to date with their annual eye exam?  Yes  Who is the provider or what is the name of the office in which the patient attends annual eye exams? [facility] If pt is not established with a provider, would they like to be referred to a provider to establish care? No .   Dental Screening: Recommended annual dental exams for proper oral hygiene  Community Resource Referral / Chronic Care Management: CRR required this visit?  No   CCM required this visit?  No      Plan:     I have personally reviewed and noted the following in the patient's chart:   . Medical and social history . Use of alcohol, tobacco  or illicit drugs  . Current medications and supplements . Functional ability and status . Nutritional status . Physical activity . Advanced directives . List of other physicians . Hospitalizations, surgeries, and ER visits in previous 12 months . Vitals . Screenings to include cognitive, depression, and falls . Referrals and appointments  In addition, I have reviewed and discussed with patient certain preventive protocols, quality metrics, and best  practice recommendations. A written personalized care plan for preventive services as well as general preventive health recommendations were provided to patient.     Barb Merino, LPN   [id number]   Nurse Notes:

## 2019-12-03 ENCOUNTER — Encounter: Payer: Medicare HMO | Admitting: Family Medicine

## 2019-12-05 ENCOUNTER — Encounter: Payer: Medicare HMO | Admitting: Nurse Practitioner

## 2019-12-05 ENCOUNTER — Encounter: Payer: Medicare HMO | Admitting: Family Medicine

## 2019-12-05 ENCOUNTER — Ambulatory Visit: Payer: Medicare HMO

## 2019-12-14 ENCOUNTER — Other Ambulatory Visit: Payer: Self-pay | Admitting: Family Medicine

## 2019-12-22 ENCOUNTER — Encounter: Payer: Self-pay | Admitting: Nurse Practitioner

## 2019-12-22 DIAGNOSIS — N183 Chronic kidney disease, stage 3 unspecified: Secondary | ICD-10-CM | POA: Insufficient documentation

## 2019-12-22 DIAGNOSIS — I700 Atherosclerosis of aorta: Secondary | ICD-10-CM | POA: Insufficient documentation

## 2019-12-26 ENCOUNTER — Encounter: Payer: Self-pay | Admitting: Nurse Practitioner

## 2019-12-26 ENCOUNTER — Ambulatory Visit (INDEPENDENT_AMBULATORY_CARE_PROVIDER_SITE_OTHER): Payer: Medicare HMO | Admitting: Nurse Practitioner

## 2019-12-26 ENCOUNTER — Other Ambulatory Visit: Payer: Self-pay

## 2019-12-26 VITALS — BP 111/66 | HR 71 | Temp 97.5°F | Ht 61.26 in | Wt 160.2 lb

## 2019-12-26 DIAGNOSIS — E119 Type 2 diabetes mellitus without complications: Secondary | ICD-10-CM | POA: Diagnosis not present

## 2019-12-26 DIAGNOSIS — Z1231 Encounter for screening mammogram for malignant neoplasm of breast: Secondary | ICD-10-CM

## 2019-12-26 DIAGNOSIS — E1159 Type 2 diabetes mellitus with other circulatory complications: Secondary | ICD-10-CM

## 2019-12-26 DIAGNOSIS — I152 Hypertension secondary to endocrine disorders: Secondary | ICD-10-CM | POA: Diagnosis not present

## 2019-12-26 DIAGNOSIS — N1831 Chronic kidney disease, stage 3a: Secondary | ICD-10-CM | POA: Diagnosis not present

## 2019-12-26 DIAGNOSIS — I700 Atherosclerosis of aorta: Secondary | ICD-10-CM | POA: Diagnosis not present

## 2019-12-26 DIAGNOSIS — E538 Deficiency of other specified B group vitamins: Secondary | ICD-10-CM | POA: Diagnosis not present

## 2019-12-26 DIAGNOSIS — E1169 Type 2 diabetes mellitus with other specified complication: Secondary | ICD-10-CM | POA: Diagnosis not present

## 2019-12-26 DIAGNOSIS — E039 Hypothyroidism, unspecified: Secondary | ICD-10-CM | POA: Diagnosis not present

## 2019-12-26 DIAGNOSIS — M8588 Other specified disorders of bone density and structure, other site: Secondary | ICD-10-CM | POA: Diagnosis not present

## 2019-12-26 DIAGNOSIS — R809 Proteinuria, unspecified: Secondary | ICD-10-CM

## 2019-12-26 DIAGNOSIS — E1129 Type 2 diabetes mellitus with other diabetic kidney complication: Secondary | ICD-10-CM

## 2019-12-26 DIAGNOSIS — Z0000 Encounter for general adult medical examination without abnormal findings: Secondary | ICD-10-CM

## 2019-12-26 DIAGNOSIS — N183 Chronic kidney disease, stage 3 unspecified: Secondary | ICD-10-CM | POA: Diagnosis not present

## 2019-12-26 DIAGNOSIS — E785 Hyperlipidemia, unspecified: Secondary | ICD-10-CM | POA: Diagnosis not present

## 2019-12-26 MED ORDER — GLYBURIDE 5 MG PO TABS
5.0000 mg | ORAL_TABLET | Freq: Every day | ORAL | 4 refills | Status: DC
Start: 2019-12-26 — End: 2020-04-21

## 2019-12-26 MED ORDER — VICTOZA 18 MG/3ML ~~LOC~~ SOPN: PEN_INJECTOR | SUBCUTANEOUS | 11 refills | Status: DC

## 2019-12-26 MED ORDER — PIOGLITAZONE HCL 30 MG PO TABS
ORAL_TABLET | ORAL | 4 refills | Status: DC
Start: 2019-12-26 — End: 2021-02-18

## 2019-12-26 MED ORDER — PRAVASTATIN SODIUM 40 MG PO TABS
ORAL_TABLET | ORAL | 4 refills | Status: DC
Start: 2019-12-26 — End: 2021-02-18

## 2019-12-26 MED ORDER — METFORMIN HCL ER 500 MG PO TB24: ORAL_TABLET | ORAL | 4 refills | Status: DC

## 2019-12-26 MED ORDER — RALOXIFENE HCL 60 MG PO TABS
60.0000 mg | ORAL_TABLET | Freq: Every day | ORAL | 4 refills | Status: DC
Start: 2019-12-26 — End: 2021-02-04

## 2019-12-26 MED ORDER — QUINAPRIL HCL 10 MG PO TABS: 10.0000 mg | ORAL_TABLET | Freq: Every day | ORAL | 4 refills | Status: DC

## 2019-12-26 MED ORDER — LEVOTHYROXINE SODIUM 75 MCG PO TABS: ORAL_TABLET | ORAL | 4 refills | Status: DC

## 2019-12-26 NOTE — Assessment & Plan Note (Signed)
Noted on CT imaging 07/01/2017.  Continue statin and ASA daily for prevention + good diabetes control. 

## 2019-12-26 NOTE — Assessment & Plan Note (Signed)
Chronic, ongoing with good control.  A1C 6.2% today.  Discussed with her attempting to minimize medication due to good control and age >61.  At this time will reduce Glyburide to once a day dosing and she is to monitor BS in morning consistently, if >130 then return to twice a day dosing.  Goal in long run is to discontinue Glyburide and if elevations without this on board could add on SGLT2.  Would also benefit from discontinuation of Actos in future.  At this time continue Metformin, Actos, and Victoza.  Urine ALB 80 today, continue Accupril for kidney protection.  Check BS at home daily and document for visits.  Return in 6 months.

## 2019-12-26 NOTE — Assessment & Plan Note (Signed)
Chronic, stable with BP at goal in office.  Continue Accupril at current dose and adjust as needed, provides kidney protection with her proteinuria.  Recommend she monitor BP at least a few mornings a week at home and document.  DASH diet at home. Labs today.  Return in 6 months.  

## 2019-12-26 NOTE — Assessment & Plan Note (Signed)
Noted on past DEXA, continue Raloxifene.  Repeat DEXA in June 2021.  Vit D level check today.

## 2019-12-26 NOTE — Assessment & Plan Note (Signed)
Chronic, ongoing.  Continue current medication regimen and adjust as needed.  Lipid panel today.  Refills sent in. 

## 2019-12-26 NOTE — Assessment & Plan Note (Signed)
History of low level reported, check today and continue supplement.  Is on long term Metformin which can lower B12.

## 2019-12-26 NOTE — Progress Notes (Signed)
BP 111/66    Pulse 71    Temp (!) 97.5 F (36.4 C) (Oral)    Ht 5' 1.26" (1.556 m)    Wt 160 lb 3.2 oz (72.7 kg)    LMP  (LMP Unknown)    SpO2 100%    BMI 30.01 kg/m    Subjective:    Patient ID: Emma Berry, female    DOB: 19-Apr-1950, 69 y.o.   MRN: [id number]  HPI: Emma Berry is a 69 y.o. female presenting on 12/26/2019 for comprehensive medical examination. Current medical complaints include:none  She currently lives with: husband Menopausal Symptoms: no   DIABETES Continues on Actos, Metformin, Victoza, and Glyburide with last A1c in May 2021 of 6.8%.  Takes daily Vitamin B12 for history of low levels. Hypoglycemic episodes:no Polydipsia/polyuria: no Visual disturbance: no Chest pain: no Paresthesias: no Glucose Monitoring: yes  Accucheck frequency: Daily  Fasting glucose: 80-100  Post prandial:  Evening:  Before meals: Taking Insulin?: no  Long acting insulin:  Short acting insulin: Blood Pressure Monitoring: rarely Retinal Examination: Up to Date -- Dr. Alexia Freestone in November Foot Exam: Up to Date Pneumovax: Up to Date Influenza: Up to Date Aspirin: yes   HYPERTENSION / HYPERLIPIDEMIA Continues on Accupril, ASA, Pravastatin.  Last GFR 57 and LDL 57 in May 2021.  On CT 07/01/2017 aortic atherosclerosis noted. Satisfied with current treatment? yes Duration of hypertension: chronic BP monitoring frequency: rarely BP range:  BP medication side effects: no Duration of hyperlipidemia: chronic Cholesterol medication side effects: no Cholesterol supplements: none Medication compliance: good compliance Aspirin: yes Recent stressors: no Recurrent headaches: no Visual changes: no Palpitations: no Dyspnea: no Chest pain: no Lower extremity edema: no Dizzy/lightheaded: no   OSTEOPENIA With last DEXA in June 2017, T-score of -2.2.  Is on Raloxifene. Satisfied with current treatment?: yes Medication side effects: no Medication compliance: good compliance Past  osteoporosis medications/treatments:  Adequate calcium & vitamin D: yes Weight bearing exercises: yes   HYPOTHYROIDISM Continues on Levothyroxine 75 MCG daily.  Last TSH 1.510 in May 2020. Thyroid control status:controlled Satisfied with current treatment? yes Medication side effects: no Medication compliance: good compliance Etiology of hypothyroidism:  Recent dose adjustment:no Fatigue: no Cold intolerance: no Heat intolerance: no Weight gain: no Weight loss: no Constipation: no Diarrhea/loose stools: no Palpitations: no Lower extremity edema: no Anxiety/depressed mood: no  Depression Screen done today and results listed below:  Depression screen [facility] 2/9 12/26/2019 11/30/2019 11/29/2018 05/30/2018 11/24/2017  Decreased Interest 0 0 0 0 0  Down, Depressed, Hopeless 0 0 0 0 0  PHQ - 2 Score 0 0 0 0 0  Altered sleeping - - - - -  Tired, decreased energy - - - - -  Change in appetite - - - - -  Feeling bad or failure about yourself  - - - - -  Trouble concentrating - - - - -  Moving slowly or fidgety/restless - - - - -  Suicidal thoughts - - - - -  PHQ-9 Score - - - - -    The patient does not have a history of falls. I did not complete a risk assessment for falls. A plan of care for falls was not documented.   Past Medical History:  Past Medical History:  Diagnosis Date   Chronic kidney disease    Diabetes mellitus without complication (HCC)    Hyperlipidemia    Hypertension    Hypothyroidism    Osteopenia    Osteoporosis  Thyroid disease     Surgical History:  Past Surgical History:  Procedure Laterality Date   CESAREAN SECTION     COLONOSCOPY WITH PROPOFOL N/A 05/03/2017   Procedure: COLONOSCOPY WITH PROPOFOL;  Surgeon: [name], MD;  Location: [facility] ENDOSCOPY;  Service: Endoscopy;  Laterality: N/A;   VENTRAL HERNIA REPAIR N/A 10/31/2017   Procedure: LAPAROSCOPIC VENTRAL HERNIA;  Surgeon: Carolan Shiver, MD;  Location: [facility] ORS;   Service: General;  Laterality: N/A;   VENTRAL HERNIA REPAIR N/A 10/31/2017   Procedure: HERNIA REPAIR VENTRAL ADULT;  Surgeon: Carolan Shiver, MD;  Location: [facility] ORS;  Service: General;  Laterality: N/A;    Medications:  Current Outpatient Medications on File Prior to Visit  Medication Sig   aspirin EC 81 MG tablet Take 81 mg by mouth at bedtime.   calcium carbonate (OS-CAL) 600 MG TABS tablet Take 600 mg by mouth at bedtime.   Cholecalciferol (VITAMIN D3) 5000 units CAPS Take 1 capsule by mouth daily.   ferrous gluconate (FERGON) 225 (27 Fe) MG tablet Take 240 mg by mouth daily.   ONE TOUCH ULTRA TEST test strip CHECK BLOOD SUGAR ONCE DAILY   vitamin B-12 (CYANOCOBALAMIN) 1000 MCG tablet Take 1,000 mcg by mouth at bedtime.   No current facility-administered medications on file prior to visit.    Allergies:  No Known Allergies  Social History:  Social History   Socioeconomic History   Marital status: Married    Spouse name: Not on file   Number of children: Not on file   Years of education: Not on file   Highest education level: Not on file  Occupational History   Occupation: retired  Tobacco Use   Smoking status: Never Smoker   Smokeless tobacco: Never Used  Building services engineer Use: Never used  Substance and Sexual Activity   Alcohol use: No    Alcohol/week: 0.0 standard drinks   Drug use: No   Sexual activity: Yes  Other Topics Concern   Not on file  Social History Narrative   Not on file   Social Determinants of Health   Financial Resource Strain: Low Risk    Difficulty of Paying Living Expenses: Not hard at all  Food Insecurity: No Food Insecurity   Worried About Programme researcher, broadcasting/film/video in the Last Year: Never true   Ran Out of Food in the Last Year: Never true  Transportation Needs: No Transportation Needs   Lack of Transportation (Medical): No   Lack of Transportation (Non-Medical): No  Physical Activity: Inactive   Days  of Exercise per Week: 0 days   Minutes of Exercise per Session: 0 min  Stress: No Stress Concern Present   Feeling of Stress : Not at all  Social Connections: Not on file  Intimate Partner Violence: Not on file   Social History   Tobacco Use  Smoking Status Never Smoker  Smokeless Tobacco Never Used   Social History   Substance and Sexual Activity  Alcohol Use No   Alcohol/week: 0.0 standard drinks    Family History:  Family History  Problem Relation Age of Onset   Cancer Mother 88       melanoma   Diabetes Mother    Thyroid disease Mother    Cancer Father 22       prostate to bone   Diabetes Daughter    Heart disease Maternal Grandfather    Stroke Paternal Grandmother    Stroke Paternal Grandfather    Breast cancer  Neg Hx     Past medical history, surgical history, medications, allergies, family history and social history reviewed with patient today and changes made to appropriate areas of the chart.   Review of Systems - negative All other ROS negative except what is listed above and in the HPI.      Objective:    BP 111/66    Pulse 71    Temp (!) 97.5 F (36.4 C) (Oral)    Ht 5' 1.26" (1.556 m)    Wt 160 lb 3.2 oz (72.7 kg)    LMP  (LMP Unknown)    SpO2 100%    BMI 30.01 kg/m   Wt Readings from Last 3 Encounters:  12/26/19 160 lb 3.2 oz (72.7 kg)  11/30/19 159 lb (72.1 kg)  05/30/19 162 lb (73.5 kg)    Physical Exam Vitals and nursing note reviewed.  Constitutional:      General: She is awake. She is not in acute distress.    Appearance: She is well-developed. She is not ill-appearing.  HENT:     Head: Normocephalic and atraumatic.     Right Ear: Hearing, tympanic membrane, ear canal and external ear normal. No drainage.     Left Ear: Hearing, tympanic membrane, ear canal and external ear normal. No drainage.     Nose: Nose normal.     Right Sinus: No maxillary sinus tenderness or frontal sinus tenderness.     Left Sinus: No maxillary  sinus tenderness or frontal sinus tenderness.     Mouth/Throat:     Mouth: Mucous membranes are moist.     Pharynx: Oropharynx is clear. Uvula midline. No pharyngeal swelling, oropharyngeal exudate or posterior oropharyngeal erythema.  Eyes:     General: Lids are normal.        Right eye: No discharge.        Left eye: No discharge.     Extraocular Movements: Extraocular movements intact.     Conjunctiva/sclera: Conjunctivae normal.     Pupils: Pupils are equal, round, and reactive to light.     Visual Fields: Right eye visual fields normal and left eye visual fields normal.  Neck:     Thyroid: No thyromegaly.     Vascular: No carotid bruit.     Trachea: Trachea normal.  Cardiovascular:     Rate and Rhythm: Normal rate and regular rhythm.     Heart sounds: Normal heart sounds. No murmur heard. No gallop.   Pulmonary:     Effort: Pulmonary effort is normal. No accessory muscle usage or respiratory distress.     Breath sounds: Normal breath sounds.  Chest:     Comments: Deferred per patient request Abdominal:     General: Bowel sounds are normal.     Palpations: Abdomen is soft. There is no hepatomegaly or splenomegaly.     Tenderness: There is no abdominal tenderness.  Musculoskeletal:        General: Normal range of motion.     Cervical back: Normal range of motion and neck supple.     Right lower leg: No edema.     Left lower leg: No edema.  Lymphadenopathy:     Head:     Right side of head: No submental, submandibular, tonsillar, preauricular or posterior auricular adenopathy.     Left side of head: No submental, submandibular, tonsillar, preauricular or posterior auricular adenopathy.     Cervical: No cervical adenopathy.  Skin:    General: Skin is warm and dry.  Capillary Refill: Capillary refill takes less than 2 seconds.     Findings: No rash.  Neurological:     Mental Status: She is alert and oriented to person, place, and time.     Cranial Nerves: Cranial  nerves are intact.     Gait: Gait is intact.     Deep Tendon Reflexes: Reflexes are normal and symmetric.     Reflex Scores:      Brachioradialis reflexes are 2+ on the right side and 2+ on the left side.      Patellar reflexes are 2+ on the right side and 2+ on the left side. Psychiatric:        Attention and Perception: Attention normal.        Mood and Affect: Mood normal.        Speech: Speech normal.        Behavior: Behavior normal. Behavior is cooperative.        Thought Content: Thought content normal.        Judgment: Judgment normal.    Diabetic Foot Exam - Simple   Simple Foot Form Diabetic Foot exam was performed with the following findings: Yes   Visual Inspection No deformities, no ulcerations, no other skin breakdown bilaterally: Yes Sensation Testing Intact to touch and monofilament testing bilaterally: Yes Pulse Check Posterior Tibialis and Dorsalis pulse intact bilaterally: Yes Comments    Results for orders placed or performed in visit on 05/30/19  Comprehensive metabolic panel  Result Value Ref Range   Glucose 117 (H) 65 - 99 mg/dL   BUN 20 8 - 27 mg/dL   Creatinine, Ser 4.43 0.57 - 1.00 mg/dL   GFR calc non Af Amer 57 (L) >59 mL/min/1.73   GFR calc Af Amer 65 >59 mL/min/1.73   BUN/Creatinine Ratio 20 12 - 28   Sodium 141 134 - 144 mmol/L   Potassium 4.9 3.5 - 5.2 mmol/L   Chloride 103 96 - 106 mmol/L   CO2 24 20 - 29 mmol/L   Calcium 9.2 8.7 - 10.3 mg/dL   Total Protein 6.5 6.0 - 8.5 g/dL   Albumin 4.1 3.7 - 4.7 g/dL   Globulin, Total 2.4 1.5 - 4.5 g/dL   Albumin/Globulin Ratio 1.7 1.2 - 2.2   Bilirubin Total 0.3 0.0 - 1.2 mg/dL   Alkaline Phosphatase 75 48 - 121 IU/L   AST 16 0 - 40 IU/L   ALT 10 0 - 32 IU/L  Lipid Panel w/o Chol/HDL Ratio  Result Value Ref Range   Cholesterol, Total 120 100 - 199 mg/dL   Triglycerides 75 0 - 149 mg/dL   HDL 48 >15 mg/dL   VLDL Cholesterol Cal 15 5 - 40 mg/dL   LDL Chol Calc (NIH) 57 0 - 99 mg/dL  HgB Q0G   Result Value Ref Range   Hgb A1c MFr Bld 6.8 (H) 4.8 - 5.6 %   Est. average glucose Bld gHb Est-mCnc 148 mg/dL      Assessment & Plan:   Problem List Items Addressed This Visit      Cardiovascular and Mediastinum   Hypertension associated with diabetes (HCC)    Chronic, stable with BP at goal in office.  Continue Accupril at current dose and adjust as needed, provides kidney protection with her proteinuria.  Recommend she monitor BP at least a few mornings a week at home and document.  DASH diet at home. Labs today.  Return in 6 months.       Relevant  Medications   metFORMIN (GLUCOPHAGE-XR) 500 MG 24 hr tablet   glyBURIDE (DIABETA) 5 MG tablet   liraglutide (VICTOZA) 18 MG/3ML SOPN   pioglitazone (ACTOS) 30 MG tablet   pravastatin (PRAVACHOL) 40 MG tablet   quinapril (ACCUPRIL) 10 MG tablet   Other Relevant Orders   Microalbumin, Urine Waived   Comprehensive metabolic panel   Aortic atherosclerosis (HCC)    Noted on CT imaging 07/01/2017.  Continue statin and ASA daily for prevention + good diabetes control.      Relevant Medications   pravastatin (PRAVACHOL) 40 MG tablet   quinapril (ACCUPRIL) 10 MG tablet     Endocrine   Type 2 diabetes mellitus with proteinuria (HCC) - Primary    Chronic, ongoing with good control.  A1C 6.2% today.  Discussed with her attempting to minimize medication due to good control and age [age].  At this time will reduce Glyburide to once a day dosing and she is to monitor BS in morning consistently, if >130 then return to twice a day dosing.  Goal in long run is to discontinue Glyburide and if elevations without this on board could add on SGLT2.  Would also benefit from discontinuation of Actos in future.  At this time continue Metformin, Actos, and Victoza.  Urine ALB 80 today, continue Accupril for kidney protection.  Check BS at home daily and document for visits.  Return in 6 months.      Relevant Medications   metFORMIN (GLUCOPHAGE-XR) 500 MG 24  hr tablet   glyBURIDE (DIABETA) 5 MG tablet   liraglutide (VICTOZA) 18 MG/3ML SOPN   pioglitazone (ACTOS) 30 MG tablet   pravastatin (PRAVACHOL) 40 MG tablet   quinapril (ACCUPRIL) 10 MG tablet   Other Relevant Orders   Bayer DCA Hb A1c Waived (STAT)   Hyperlipidemia associated with type 2 diabetes mellitus (HCC)    Chronic, ongoing.  Continue current medication regimen and adjust as needed. Lipid panel today.  Refills sent in.       Relevant Medications   metFORMIN (GLUCOPHAGE-XR) 500 MG 24 hr tablet   glyBURIDE (DIABETA) 5 MG tablet   liraglutide (VICTOZA) 18 MG/3ML SOPN   pioglitazone (ACTOS) 30 MG tablet   pravastatin (PRAVACHOL) 40 MG tablet   quinapril (ACCUPRIL) 10 MG tablet   Other Relevant Orders   Lipid Panel w/o Chol/HDL Ratio   Hypothyroidism    Chronic, ongoing.  Continue current Levothyroxine dosing and adjust as needed based on labs.  TSH and Free T4 today.      Relevant Medications   levothyroxine (SYNTHROID) 75 MCG tablet   Other Relevant Orders   TSH   T4, free     Musculoskeletal and Integument   Osteopenia    Noted on past DEXA, continue Raloxifene.  Repeat DEXA in June 2021.  Vit D level check today.      Relevant Orders   VITAMIN D 25 Hydroxy (Vit-D Deficiency, Fractures)     Genitourinary   CKD (chronic kidney disease) stage 3, GFR 30-59 ml/min (HCC)    Noted on past labs with GFR 57.  Continue to monitor and continue Accupril for kidney protection.  If worsening then consider nephrology referral.  Labs today.        Other   B12 deficiency    History of low level reported, check today and continue supplement.  Is on long term Metformin which can lower B12.      Relevant Orders   CBC with Differential/Platelet   Vitamin B12  Other Visit Diagnoses    Encounter for screening mammogram for malignant neoplasm of breast       Mammogram ordered.   Relevant Orders   MM 3D SCREEN BREAST BILATERAL   Encounter for annual physical exam        Annual labs today to include CBC, CMP, TSH, lipid       Follow up plan: Return in about 6 months (around 06/25/2020) for T2Dm, HTN/HLD -- meet new PCP.   LABORATORY TESTING:  - Pap smear: not applicable  IMMUNIZATIONS:   - Tdap: Tetanus vaccination status reviewed: last tetanus booster within 10 years. - Influenza: Up to date - Pneumovax: Up to date - Prevnar: Up to date - HPV: Not applicable - Zostavax vaccine: Up to date Zoster 2011  SCREENING: -Mammogram: Ordered today  - Colonoscopy: Up to date  - Bone Density: Up to date due next June 2022 -Hearing Test: Not applicable  -Spirometry: Not applicable   PATIENT COUNSELING:   Advised to take 1 mg of folate supplement per day if capable of pregnancy.   Sexuality: Discussed sexually transmitted diseases, partner selection, use of condoms, avoidance of unintended pregnancy  and contraceptive alternatives.   Advised to avoid cigarette smoking.  I discussed with the patient that most people either abstain from alcohol or drink within safe limits (<=14/week and <=4 drinks/occasion for males, <=7/weeks and <= 3 drinks/occasion for females) and that the risk for alcohol disorders and other health effects rises proportionally with the number of drinks per week and how often a drinker exceeds daily limits.  Discussed cessation/primary prevention of drug use and availability of treatment for abuse.   Diet: Encouraged to adjust caloric intake to maintain  or achieve ideal body weight, to reduce intake of dietary saturated fat and total fat, to limit sodium intake by avoiding high sodium foods and not adding table salt, and to maintain adequate dietary potassium and calcium preferably from fresh fruits, vegetables, and low-fat dairy products.    stressed the importance of regular exercise  Injury prevention: Discussed safety belts, safety helmets, smoke detector, smoking near bedding or upholstery.   Dental health: Discussed importance  of regular tooth brushing, flossing, and dental visits.    NEXT PREVENTATIVE PHYSICAL DUE IN 1 YEAR. Return in about 6 months (around 06/25/2020) for T2Dm, HTN/HLD -- meet new PCP.

## 2019-12-26 NOTE — Assessment & Plan Note (Signed)
Chronic, ongoing.  Continue current Levothyroxine dosing and adjust as needed based on labs.  TSH and Free T4 today. 

## 2019-12-26 NOTE — Patient Instructions (Signed)

## 2019-12-26 NOTE — Assessment & Plan Note (Addendum)
Noted on past labs with GFR 57.  Continue to monitor and continue Accupril for kidney protection.  If worsening then consider nephrology referral.  Labs today.

## 2019-12-27 LAB — CBC WITH DIFFERENTIAL/PLATELET
Basophils Absolute: 0.1 10*3/uL (ref 0.0–0.2)
Basos: 1 %
EOS (ABSOLUTE): 0.1 10*3/uL (ref 0.0–0.4)
Eos: 2 %
Hematocrit: 36.6 % (ref 34.0–46.6)
Hemoglobin: 11.8 g/dL (ref 11.1–15.9)
Immature Grans (Abs): 0 10*3/uL (ref 0.0–0.1)
Immature Granulocytes: 0 %
Lymphocytes Absolute: 1.3 10*3/uL (ref 0.7–3.1)
Lymphs: 25 %
MCH: 33.2 pg — ABNORMAL HIGH (ref 26.6–33.0)
MCHC: 32.2 g/dL (ref 31.5–35.7)
MCV: 103 fL — ABNORMAL HIGH (ref 79–97)
Monocytes Absolute: 0.7 10*3/uL (ref 0.1–0.9)
Monocytes: 13 %
Neutrophils Absolute: 3 10*3/uL (ref 1.4–7.0)
Neutrophils: 59 %
Platelets: 255 10*3/uL (ref 150–450)
RBC: 3.55 x10E6/uL — ABNORMAL LOW (ref 3.77–5.28)
RDW: 12.6 % (ref 11.7–15.4)
WBC: 5.1 10*3/uL (ref 3.4–10.8)

## 2019-12-27 LAB — LIPID PANEL W/O CHOL/HDL RATIO
Cholesterol, Total: 131 mg/dL (ref 100–199)
HDL: 54 mg/dL (ref >39)
LDL Chol Calc (NIH): 64 mg/dL (ref 0–99)
Triglycerides: 59 mg/dL (ref 0–149)
VLDL Cholesterol Cal: 13 mg/dL (ref 5–40)

## 2019-12-27 LAB — COMPREHENSIVE METABOLIC PANEL
ALT: 13 IU/L (ref 0–32)
AST: 14 IU/L (ref 0–40)
Albumin/Globulin Ratio: 1.6 (ref 1.2–2.2)
Albumin: 4.1 g/dL (ref 3.7–4.7)
Alkaline Phosphatase: 70 IU/L (ref 44–121)
BUN/Creatinine Ratio: 21 (ref 12–28)
BUN: 21 mg/dL (ref 8–27)
Bilirubin Total: 0.3 mg/dL (ref 0.0–1.2)
CO2: 22 mmol/L (ref 20–29)
Calcium: 9.1 mg/dL (ref 8.7–10.3)
Chloride: 103 mmol/L (ref 96–106)
Creatinine, Ser: 1.02 mg/dL — ABNORMAL HIGH (ref 0.57–1.00)
GFR calc Af Amer: 64 mL/min/{1.73_m2} (ref >59)
GFR calc non Af Amer: 55 mL/min/{1.73_m2} — ABNORMAL LOW (ref >59)
Globulin, Total: 2.6 g/dL (ref 1.5–4.5)
Glucose: 85 mg/dL (ref 65–99)
Potassium: 4.6 mmol/L (ref 3.5–5.2)
Sodium: 141 mmol/L (ref 134–144)
Total Protein: 6.7 g/dL (ref 6.0–8.5)

## 2019-12-27 LAB — VITAMIN D 25 HYDROXY (VIT D DEFICIENCY, FRACTURES): Vit D, 25-Hydroxy: 75.5 ng/mL (ref 30.0–100.0)

## 2019-12-27 LAB — MICROALBUMIN, URINE WAIVED
Creatinine, Urine Waived: 200 mg/dL (ref 10–300)
Microalb, Ur Waived: 80 mg/L — ABNORMAL HIGH (ref 0–19)
Microalb/Creat Ratio: <30 mg/g (ref <30)

## 2019-12-27 LAB — T4, FREE: Free T4: 1.7 ng/dL (ref 0.82–1.77)

## 2019-12-27 LAB — TSH: TSH: 1.47 u[IU]/mL (ref 0.450–4.500)

## 2019-12-27 LAB — VITAMIN B12: Vitamin B-12: >2000 pg/mL — ABNORMAL HIGH (ref 232–1245)

## 2019-12-27 LAB — BAYER DCA HB A1C WAIVED: HB A1C (BAYER DCA - WAIVED): 6.2 % (ref <7.0)

## 2019-12-27 NOTE — Progress Notes (Signed)
Contacted via MyChart   Good evening Emma Berry, your labs have returned and overall they are staying at baseline.  You continue to show some mild kidney disease that we will continue to monitor at visits, no significant decline this check.  Continue your Quinapril which is kidney protective with your diabetes.  Cholesterol levels are at goal.  B12 level normal.  Thyroid lab normal, continue current Levothyroxine dosing.  Your are doing great!!  If any questions please let me know. Keep being awesome!!  Thank you for allowing me to participate in your care. Kindest regards, Oretha Weismann

## 2020-01-02 ENCOUNTER — Other Ambulatory Visit: Payer: Self-pay

## 2020-01-02 ENCOUNTER — Ambulatory Visit
Admission: RE | Admit: 2020-01-02 | Discharge: 2020-01-02 | Disposition: A | Discharge status: Home / Self Care | Payer: Medicare HMO | Source: Ambulatory Visit | Attending: Nurse Practitioner | Admitting: Nurse Practitioner

## 2020-01-02 DIAGNOSIS — Z1231 Encounter for screening mammogram for malignant neoplasm of breast: Secondary | ICD-10-CM | POA: Diagnosis not present

## 2020-04-21 ENCOUNTER — Other Ambulatory Visit: Payer: Self-pay | Admitting: Nurse Practitioner

## 2020-04-21 ENCOUNTER — Telehealth: Payer: Self-pay | Admitting: Nurse Practitioner

## 2020-04-21 MED ORDER — GLYBURIDE 5 MG PO TABS: 5.0000 mg | ORAL_TABLET | Freq: Every day | ORAL | 4 refills | Status: DC

## 2020-04-21 NOTE — Telephone Encounter (Signed)
Once a day dosing only, I have fixed script to show this.

## 2020-04-21 NOTE — Telephone Encounter (Signed)
Tonya Calling [facility] is calling regarding the sig on glyBURIDE (DIABETA) 5 MG tablet [[id number]] Wanting to know is the pt to take once or twice per day.  Cb- [phone]

## 2020-04-21 NOTE — Telephone Encounter (Signed)
Please clarify

## 2020-04-21 NOTE — Telephone Encounter (Signed)
Pharmacy notified.

## 2020-07-01 ENCOUNTER — Other Ambulatory Visit: Payer: Self-pay

## 2020-07-01 ENCOUNTER — Encounter: Payer: Self-pay | Admitting: Nurse Practitioner

## 2020-07-01 ENCOUNTER — Ambulatory Visit (INDEPENDENT_AMBULATORY_CARE_PROVIDER_SITE_OTHER): Payer: Medicare HMO | Admitting: Nurse Practitioner

## 2020-07-01 VITALS — BP 125/67 | HR 65 | Temp 97.8°F | Wt 158.4 lb

## 2020-07-01 DIAGNOSIS — R809 Proteinuria, unspecified: Secondary | ICD-10-CM

## 2020-07-01 DIAGNOSIS — E1129 Type 2 diabetes mellitus with other diabetic kidney complication: Secondary | ICD-10-CM | POA: Diagnosis not present

## 2020-07-01 DIAGNOSIS — E039 Hypothyroidism, unspecified: Secondary | ICD-10-CM

## 2020-07-01 DIAGNOSIS — I152 Hypertension secondary to endocrine disorders: Secondary | ICD-10-CM | POA: Diagnosis not present

## 2020-07-01 DIAGNOSIS — E1169 Type 2 diabetes mellitus with other specified complication: Secondary | ICD-10-CM | POA: Diagnosis not present

## 2020-07-01 DIAGNOSIS — E1159 Type 2 diabetes mellitus with other circulatory complications: Secondary | ICD-10-CM | POA: Diagnosis not present

## 2020-07-01 DIAGNOSIS — I700 Atherosclerosis of aorta: Secondary | ICD-10-CM | POA: Diagnosis not present

## 2020-07-01 DIAGNOSIS — N1831 Chronic kidney disease, stage 3a: Secondary | ICD-10-CM | POA: Diagnosis not present

## 2020-07-01 DIAGNOSIS — E785 Hyperlipidemia, unspecified: Secondary | ICD-10-CM

## 2020-07-01 NOTE — Assessment & Plan Note (Signed)
Noted on CT imaging 07/01/2017.  Continue statin and ASA daily for prevention + good diabetes control.

## 2020-07-01 NOTE — Assessment & Plan Note (Signed)
Chronic, ongoing.  Continue current medication regimen and adjust as needed. Lipid panel today. 

## 2020-07-01 NOTE — Assessment & Plan Note (Signed)
Chronic, ongoing.  Continue current Levothyroxine dosing and adjust as needed based on labs.  TSH and Free T4 today.

## 2020-07-01 NOTE — Assessment & Plan Note (Signed)
Chronic.  Stable.  Continue to monitor and continue Accupril for kidney protection.  If worsening then consider nephrology referral.  Labs today.

## 2020-07-01 NOTE — Progress Notes (Signed)
BP 125/67   Pulse 65   Temp 97.8 F (36.6 C)   Wt 158 lb 6 oz (71.8 kg)   LMP  (LMP Unknown)   SpO2 100%   BMI 29.67 kg/m    Subjective:    Patient ID: Emma Berry, female    DOB: [birth year], 70 y.o.   MRN: [id number]  HPI: Emma Berry is a 70 y.o. female  Chief Complaint  Patient presents with   Diabetes   DIABETES Hypoglycemic episodes:no Polydipsia/polyuria: no Visual disturbance: no Chest pain: no Paresthesias: no Glucose Monitoring: yes  Accucheck frequency: Daily  Fasting glucose: 80-100  Post prandial:  Evening:  Before meals: Taking Insulin?: no  Long acting insulin:  Short acting insulin: Blood Pressure Monitoring: weekly Retinal Examination: Up to Date Foot Exam: Up to Date Diabetic Education: Not Completed Pneumovax: Up to Date Influenza: Up to Date Aspirin: yes  HYPERTENSION / HYPERLIPIDEMIA Satisfied with current treatment? yes Duration of hypertension: years BP monitoring frequency: daily BP range: 120/70 BP medication side effects: no Past BP meds: quinapril Duration of hyperlipidemia: years Cholesterol medication side effects: no Cholesterol supplements: none Past cholesterol medications: pravastatin (pravachol) Medication compliance: excellent compliance Aspirin: yes Recent stressors: no Recurrent headaches: no Visual changes: no Palpitations: no Dyspnea: no Chest pain: no Lower extremity edema: no Dizzy/lightheaded: no  HYPOTHYROIDISM Thyroid control status:controlled Satisfied with current treatment? yes Medication side effects: no Medication compliance: poor compliance Etiology of hypothyroidism:  Recent dose adjustment:no Fatigue: no Cold intolerance: no Heat intolerance: no Weight gain: no Weight loss: no Constipation: no Diarrhea/loose stools: no Palpitations: no Lower extremity edema: no Anxiety/depressed mood: no    Relevant past medical, surgical, family and social history reviewed and updated as  indicated. Interim medical history since our last visit reviewed. Allergies and medications reviewed and updated.  Review of Systems  Constitutional:  Negative for fever and unexpected weight change.  Eyes:  Negative for visual disturbance.  Respiratory:  Negative for cough, chest tightness and shortness of breath.   Cardiovascular:  Negative for chest pain, palpitations and leg swelling.  Endocrine: Negative for cold intolerance, polydipsia and polyuria.  Neurological:  Negative for dizziness, numbness and headaches.   Per HPI unless specifically indicated above     Objective:    BP 125/67   Pulse 65   Temp 97.8 F (36.6 C)   Wt 158 lb 6 oz (71.8 kg)   LMP  (LMP Unknown)   SpO2 100%   BMI 29.67 kg/m   Wt Readings from Last 3 Encounters:  07/01/20 158 lb 6 oz (71.8 kg)  12/26/19 160 lb 3.2 oz (72.7 kg)  11/30/19 159 lb (72.1 kg)    Physical Exam Vitals and nursing note reviewed.  Constitutional:      General: She is not in acute distress.    Appearance: Normal appearance. She is normal weight. She is not ill-appearing, toxic-appearing or diaphoretic.  HENT:     Head: Normocephalic.     Right Ear: External ear normal.     Left Ear: External ear normal.     Nose: Nose normal.     Mouth/Throat:     Mouth: Mucous membranes are moist.     Pharynx: Oropharynx is clear.  Eyes:     General:        Right eye: No discharge.        Left eye: No discharge.     Extraocular Movements: Extraocular movements intact.     Conjunctiva/sclera: Conjunctivae normal.  Pupils: Pupils are equal, round, and reactive to light.  Cardiovascular:     Rate and Rhythm: Normal rate and regular rhythm.     Heart sounds: No murmur heard. Pulmonary:     Effort: Pulmonary effort is normal. No respiratory distress.     Breath sounds: Normal breath sounds. No wheezing or rales.  Musculoskeletal:     Cervical back: Normal range of motion and neck supple.  Skin:    General: Skin is warm and dry.      Capillary Refill: Capillary refill takes less than 2 seconds.  Neurological:     General: No focal deficit present.     Mental Status: She is alert and oriented to person, place, and time. Mental status is at baseline.  Psychiatric:        Mood and Affect: Mood normal.        Behavior: Behavior normal.        Thought Content: Thought content normal.        Judgment: Judgment normal.    Results for orders placed or performed in visit on 12/26/19  HM DIABETES EYE EXAM  Result Value Ref Range   HM Diabetic Eye Exam No Retinopathy No Retinopathy      Assessment & Plan:   Problem List Items Addressed This Visit       Cardiovascular and Mediastinum   Hypertension associated with diabetes ([place]) - Primary    Chronic, stable with BP at goal in office.  Continue Accupril at current dose and adjust as needed, provides kidney protection with her proteinuria.  Recommend she monitor BP at least a few mornings a week at home and document.  DASH diet at home. Labs today.  Return in 6 months.        Aortic atherosclerosis ([place])    Noted on CT imaging 07/01/2017.  Continue statin and ASA daily for prevention + good diabetes control.         Endocrine   Type 2 diabetes mellitus with proteinuria (HCC)    Chronic, ongoing with good control.  A1C 6.2% today.  Discussed with her attempting to minimize medication due to good control and age >39.  A1c will be checked today.  If remains less than 6.5% will discontinue Glyberide.  Would also benefit from discontinuation of Actos in future.  At this time continue Metformin, Actos, and Victoza.  Continue Accupril for kidney protection.  Check BS at home daily and document for visits.  Return in 6 months.       Relevant Orders   HgB A1c   Hyperlipidemia associated with type 2 diabetes mellitus (HCC)    Chronic, ongoing.  Continue current medication regimen and adjust as needed. Lipid panel today.         Relevant Orders   Lipid Profile    Hypothyroidism    Chronic, ongoing.  Continue current Levothyroxine dosing and adjust as needed based on labs.  TSH and Free T4 today.       Relevant Orders   TSH   T4, free     Genitourinary   CKD (chronic kidney disease) stage 3, GFR 30-59 ml/min (HCC)    Chronic.  Stable.  Continue to monitor and continue Accupril for kidney protection.  If worsening then consider nephrology referral.  Labs today.       Relevant Orders   Comp Met (CMET)     Follow up plan: Return in about 6 months (around 12/31/2020) for Physical and Fasting labs.

## 2020-07-01 NOTE — Assessment & Plan Note (Signed)
Chronic, ongoing with good control.  A1C 6.2% today.  Discussed with her attempting to minimize medication due to good control and age >66.  A1c will be checked today.  If remains less than 6.5% will discontinue Glyberide.  Would also benefit from discontinuation of Actos in future.  At this time continue Metformin, Actos, and Victoza.  Continue Accupril for kidney protection.  Check BS at home daily and document for visits.  Return in 6 months.

## 2020-07-01 NOTE — Assessment & Plan Note (Signed)
Chronic, stable with BP at goal in office.  Continue Accupril at current dose and adjust as needed, provides kidney protection with her proteinuria.  Recommend she monitor BP at least a few mornings a week at home and document.  DASH diet at home. Labs today.  Return in 6 months.

## 2020-07-04 LAB — COMPREHENSIVE METABOLIC PANEL
ALT: 9 IU/L (ref 0–32)
AST: 17 IU/L (ref 0–40)
Albumin/Globulin Ratio: 1.7 (ref 1.2–2.2)
Albumin: 4.1 g/dL (ref 3.7–4.7)
Alkaline Phosphatase: 60 IU/L (ref 44–121)
BUN/Creatinine Ratio: 17 (ref 12–28)
BUN: 17 mg/dL (ref 8–27)
Bilirubin Total: 0.2 mg/dL (ref 0.0–1.2)
CO2: 20 mmol/L (ref 20–29)
Calcium: 9 mg/dL (ref 8.7–10.3)
Chloride: 104 mmol/L (ref 96–106)
Creatinine, Ser: 1 mg/dL (ref 0.57–1.00)
Globulin, Total: 2.4 g/dL (ref 1.5–4.5)
Glucose: 68 mg/dL (ref 65–99)
Potassium: 4.4 mmol/L (ref 3.5–5.2)
Sodium: 142 mmol/L (ref 134–144)
Total Protein: 6.5 g/dL (ref 6.0–8.5)
eGFR: 60 mL/min/{1.73_m2} (ref >59)

## 2020-07-04 LAB — LIPID PANEL
Chol/HDL Ratio: 2.6 ratio (ref 0.0–4.4)
Cholesterol, Total: 125 mg/dL (ref 100–199)
HDL: 48 mg/dL (ref >39)
LDL Chol Calc (NIH): 64 mg/dL (ref 0–99)
Triglycerides: 60 mg/dL (ref 0–149)
VLDL Cholesterol Cal: 13 mg/dL (ref 5–40)

## 2020-07-04 LAB — HEMOGLOBIN A1C
Est. average glucose Bld gHb Est-mCnc: 137 mg/dL
Hgb A1c MFr Bld: 6.4 % — ABNORMAL HIGH (ref 4.8–5.6)

## 2020-07-04 LAB — TSH: TSH: 1.35 u[IU]/mL (ref 0.450–4.500)

## 2020-07-04 LAB — T4, FREE: Free T4: 1.69 ng/dL (ref 0.82–1.77)

## 2020-07-07 NOTE — Progress Notes (Signed)
Please call patient and let her know that her lab work shows that her diabetes remains well controlled with an A1c of 6.4.  I would like her to stop taking the Glyburide as we discussed during the visit. If she starts to see her sugars increase we can always add it back.  Other lab work looks great.  I will see her at her next visit.

## 2020-07-08 NOTE — Progress Notes (Signed)
It is okay for patient to get the 2nd booster.  She may experience similar side effects but they should resolve.

## 2020-07-08 NOTE — Progress Notes (Signed)
It is okay for patient to get 2nd booster. She may have a similar reaction but it should resolve.

## 2020-07-09 ENCOUNTER — Telehealth: Payer: Self-pay

## 2020-07-09 NOTE — Telephone Encounter (Signed)
Returned patients call. See result note ?

## 2020-07-09 NOTE — Telephone Encounter (Signed)
Copied from CRM [id number]. Topic: General - Call Back - No Documentation >> Jul 09, 2020 10:10 AM Marylen Ponto wrote: Reason for CRM: Pt stated she missed a call from the office so she was returning the call. Pt requests call back. Cb# [phone]  Pt calling back in regard to lab voicemail

## 2020-11-03 NOTE — Progress Notes (Signed)
BP 121/75   Pulse 67   Temp 98.4 F (36.9 C) (Oral)   Ht _0  (1.575 m)   Wt 159 lb 2 oz (72.2 kg)   LMP  (LMP Unknown)   SpO2 98%   BMI 29.10 kg/m    Subjective:    Patient ID: Emma Berry, female    DOB: 04-17-1950, 70 y.o.   MRN: [id number]  HPI: Emma Berry is a 70 y.o. female  Chief Complaint  Patient presents with   Hip Pain    Pain in right hip runs down leg to knee. Pain has been increasing for a week.   HYPERTENSION / HYPERLIPIDEMIA Satisfied with current treatment? yes Duration of hypertension: years BP monitoring frequency: daily BP range: 120/70 BP medication side effects: no Past BP meds: quinapril Duration of hyperlipidemia: years Cholesterol medication side effects: no Cholesterol supplements: none Past cholesterol medications: pravastatin (pravachol) Medication compliance: excellent compliance Aspirin: no Recent stressors: no Recurrent headaches: no Visual changes: no Palpitations: no Dyspnea: no Chest pain: no Lower extremity edema: no Dizzy/lightheaded: no  HYPOTHYROIDISM Thyroid control status:controlled Satisfied with current treatment? yes Medication side effects: no Medication compliance: excellent compliance Etiology of hypothyroidism:  Recent dose adjustment:no Fatigue: no Cold intolerance: no Heat intolerance: no Weight gain: no Weight loss: no Constipation: no Diarrhea/loose stools: no Palpitations: no Lower extremity edema: no Anxiety/depressed mood: no  CHRONIC KIDNEY DISEASE CKD status: controlled Medications renally dose: no Previous renal evaluation: no Pneumovax:  Up to Date Influenza Vaccine:  Up to Date  DIABETES Hypoglycemic episodes:no Polydipsia/polyuria: no Visual disturbance: no Chest pain: no Paresthesias: no Glucose Monitoring: no  Accucheck frequency: Daily  Fasting glucose: 90-100  Post prandial:  Evening:  Before meals: Taking Insulin?: no  Long acting insulin:  Short acting  insulin: Blood Pressure Monitoring: daily Retinal Examination: Up to Date Foot Exam: Up to Date Diabetic Education: Not Completed Pneumovax: Up to Date Influenza: Up to Date Aspirin: yes  HIP PAIN Patient states she has been having hip pain that runs down to her right knee.  States this has been going on for about a week.  States it isn't excruciating but it has been bothering her.  She wants to prevent it from getting that bad.   She took Gabapentin in the past for the same pain and it improved her symptoms.    Relevant past medical, surgical, family and social history reviewed and updated as indicated. Interim medical history since our last visit reviewed. Allergies and medications reviewed and updated.  Review of Systems  Eyes:  Negative for visual disturbance.  Respiratory:  Negative for chest tightness and shortness of breath.   Cardiovascular:  Negative for chest pain, palpitations and leg swelling.  Endocrine: Negative for polydipsia and polyuria.  Neurological:  Negative for dizziness, light-headedness, numbness and headaches.   Per HPI unless specifically indicated above     Objective:    BP 121/75   Pulse 67   Temp 98.4 F (36.9 C) (Oral)   Ht _1  (1.575 m)   Wt 159 lb 2 oz (72.2 kg)   LMP  (LMP Unknown)   SpO2 98%   BMI 29.10 kg/m   Wt Readings from Last 3 Encounters:  11/04/20 159 lb 2 oz (72.2 kg)  07/01/20 158 lb 6 oz (71.8 kg)  12/26/19 160 lb 3.2 oz (72.7 kg)    Physical Exam Vitals and nursing note reviewed.  Constitutional:      General: She is not in  acute distress.    Appearance: Normal appearance. She is normal weight. She is not ill-appearing, toxic-appearing or diaphoretic.  HENT:     Head: Normocephalic.     Right Ear: External ear normal.     Left Ear: External ear normal.     Nose: Nose normal.     Mouth/Throat:     Mouth: Mucous membranes are moist.     Pharynx: Oropharynx is clear.  Eyes:     General:        Right eye: No  discharge.        Left eye: No discharge.     Extraocular Movements: Extraocular movements intact.     Conjunctiva/sclera: Conjunctivae normal.     Pupils: Pupils are equal, round, and reactive to light.  Cardiovascular:     Rate and Rhythm: Normal rate and regular rhythm.     Heart sounds: No murmur heard. Pulmonary:     Effort: Pulmonary effort is normal. No respiratory distress.     Breath sounds: Normal breath sounds. No wheezing or rales.  Musculoskeletal:     Cervical back: Normal range of motion and neck supple.  Skin:    General: Skin is warm and dry.     Capillary Refill: Capillary refill takes less than 2 seconds.  Neurological:     General: No focal deficit present.     Mental Status: She is alert and oriented to person, place, and time. Mental status is at baseline.  Psychiatric:        Mood and Affect: Mood normal.        Behavior: Behavior normal.        Thought Content: Thought content normal.        Judgment: Judgment normal.    Results for orders placed or performed in visit on 07/01/20  Comp Met (CMET)  Result Value Ref Range   Glucose 68 65 - 99 mg/dL   BUN 17 8 - 27 mg/dL   Creatinine, Ser 1.00 0.57 - 1.00 mg/dL   eGFR 60 >59 mL/min/1.73   BUN/Creatinine Ratio 17 12 - 28   Sodium 142 134 - 144 mmol/L   Potassium 4.4 3.5 - 5.2 mmol/L   Chloride 104 96 - 106 mmol/L   CO2 20 20 - 29 mmol/L   Calcium 9.0 8.7 - 10.3 mg/dL   Total Protein 6.5 6.0 - 8.5 g/dL   Albumin 4.1 3.7 - 4.7 g/dL   Globulin, Total 2.4 1.5 - 4.5 g/dL   Albumin/Globulin Ratio 1.7 1.2 - 2.2   Bilirubin Total 0.2 0.0 - 1.2 mg/dL   Alkaline Phosphatase 60 44 - 121 IU/L   AST 17 0 - 40 IU/L   ALT 9 0 - 32 IU/L  Lipid Profile  Result Value Ref Range   Cholesterol, Total 125 100 - 199 mg/dL   Triglycerides 60 0 - 149 mg/dL   HDL 48 >39 mg/dL   VLDL Cholesterol Cal 13 5 - 40 mg/dL   LDL Chol Calc (NIH) 64 0 - 99 mg/dL   Chol/HDL Ratio 2.6 0.0 - 4.4 ratio  HgB A1c  Result Value Ref  Range   Hgb A1c MFr Bld 6.4 (H) 4.8 - 5.6 %   Est. average glucose Bld gHb Est-mCnc 137 mg/dL  TSH  Result Value Ref Range   TSH 1.350 0.450 - 4.500 uIU/mL  T4, free  Result Value Ref Range   Free T4 1.69 0.82 - 1.77 ng/dL      Assessment & Plan:  Problem List Items Addressed This Visit       Cardiovascular and Mediastinum   Hypertension associated with diabetes ([place]) - Primary    Chronic.  Controlled.  Continue with current medication regimen.  Labs ordered today.  Return to clinic in 6 months for reevaluation.  Call sooner if concerns arise.        Relevant Orders   Comp Met (CMET)   Aortic atherosclerosis ([place])    Noted on CT imaging 07/01/2017.  Continue statin and ASA daily for prevention + good diabetes control.  Continue with Pravastatin.      Relevant Orders   Lipid Profile     Endocrine   Type 2 diabetes mellitus with proteinuria (HCC)    Chronic.  Controlled.  Continue with current medication regimen on Victoza, Metformin, Actos, and Glyburide. Would like to stop glyberide and Actos but patient is hesitant.  Labs ordered today.  Return to clinic in 6 months for reevaluation.  Call sooner if concerns arise.        Relevant Orders   HgB A1c   Hyperlipidemia associated with type 2 diabetes mellitus (HCC)    Chronic.  Controlled.  Continue with current medication regimen on Pravastatin 89m.  Labs ordered today.  Return to clinic in 6 months for reevaluation.  Call sooner if concerns arise.        Relevant Orders   Lipid Profile   Hypothyroidism    Labs ordered today. Will make recommendations based on lab results.       Relevant Orders   TSH   T4, free     Genitourinary   CKD (chronic kidney disease) stage 3, GFR 30-59 ml/min (HCC)    Chronic.  Controlled.  Continue with current medication regimen.  Labs ordered today.  Return to clinic in 6 months for reevaluation.  Call sooner if concerns arise.        Other Visit Diagnoses     Right sciatic  nerve pain       Will start Gabapentin TID.  Discussed exercises that can be done to help with sciatica.  Follow up if symptoms do not improve. Will refill gabapentin if needed   Relevant Medications   gabapentin (NEURONTIN) 300 MG capsule        Follow up plan: Return in about 6 months (around 05/05/2021) for Physical and Fasting labs.

## 2020-11-04 ENCOUNTER — Ambulatory Visit (INDEPENDENT_AMBULATORY_CARE_PROVIDER_SITE_OTHER): Payer: Medicare HMO | Admitting: Nurse Practitioner

## 2020-11-04 ENCOUNTER — Encounter: Payer: Self-pay | Admitting: Nurse Practitioner

## 2020-11-04 ENCOUNTER — Other Ambulatory Visit: Payer: Self-pay

## 2020-11-04 VITALS — BP 121/75 | HR 67 | Temp 98.4°F | Ht 62.0 in | Wt 159.1 lb

## 2020-11-04 DIAGNOSIS — E1129 Type 2 diabetes mellitus with other diabetic kidney complication: Secondary | ICD-10-CM

## 2020-11-04 DIAGNOSIS — E1159 Type 2 diabetes mellitus with other circulatory complications: Secondary | ICD-10-CM

## 2020-11-04 DIAGNOSIS — I700 Atherosclerosis of aorta: Secondary | ICD-10-CM | POA: Diagnosis not present

## 2020-11-04 DIAGNOSIS — E785 Hyperlipidemia, unspecified: Secondary | ICD-10-CM | POA: Diagnosis not present

## 2020-11-04 DIAGNOSIS — I152 Hypertension secondary to endocrine disorders: Secondary | ICD-10-CM | POA: Diagnosis not present

## 2020-11-04 DIAGNOSIS — E1169 Type 2 diabetes mellitus with other specified complication: Secondary | ICD-10-CM | POA: Diagnosis not present

## 2020-11-04 DIAGNOSIS — M5431 Sciatica, right side: Secondary | ICD-10-CM

## 2020-11-04 DIAGNOSIS — Z23 Encounter for immunization: Secondary | ICD-10-CM | POA: Diagnosis not present

## 2020-11-04 DIAGNOSIS — R809 Proteinuria, unspecified: Secondary | ICD-10-CM

## 2020-11-04 DIAGNOSIS — E039 Hypothyroidism, unspecified: Secondary | ICD-10-CM

## 2020-11-04 DIAGNOSIS — N1831 Chronic kidney disease, stage 3a: Secondary | ICD-10-CM | POA: Diagnosis not present

## 2020-11-04 MED ORDER — GABAPENTIN 300 MG PO CAPS
300.0000 mg | ORAL_CAPSULE | Freq: Three times a day (TID) | ORAL | 1 refills | Status: DC
Start: 2020-11-04 — End: 2020-11-20

## 2020-11-04 NOTE — Assessment & Plan Note (Addendum)
Chronic.  Controlled.  Continue with current medication regimen on Victoza, Metformin, Actos, and Glyburide. Would like to stop glyberide and Actos but patient is hesitant.  Labs ordered today.  Return to clinic in 6 months for reevaluation.  Call sooner if concerns arise.

## 2020-11-04 NOTE — Assessment & Plan Note (Signed)
Noted on CT imaging 07/01/2017.  Continue statin and ASA daily for prevention + good diabetes control.  Continue with Pravastatin. ?

## 2020-11-04 NOTE — Assessment & Plan Note (Signed)
Chronic.  Controlled.  Continue with current medication regimen.  Labs ordered today.  Return to clinic in 6 months for reevaluation.  Call sooner if concerns arise.  ? ?

## 2020-11-04 NOTE — Assessment & Plan Note (Signed)
Labs ordered today.  Will make recommendations based on lab results. ?

## 2020-11-04 NOTE — Assessment & Plan Note (Signed)
Chronic.  Controlled.  Continue with current medication regimen on Pravastatin 40mg.  Labs ordered today.  Return to clinic in 6 months for reevaluation.  Call sooner if concerns arise.  ° °

## 2020-11-05 LAB — COMPREHENSIVE METABOLIC PANEL
ALT: 9 IU/L (ref 0–32)
AST: 14 IU/L (ref 0–40)
Albumin/Globulin Ratio: 2.2 (ref 1.2–2.2)
Albumin: 4.1 g/dL (ref 3.7–4.7)
Alkaline Phosphatase: 67 IU/L (ref 44–121)
BUN/Creatinine Ratio: 21 (ref 12–28)
BUN: 19 mg/dL (ref 8–27)
Bilirubin Total: 0.2 mg/dL (ref 0.0–1.2)
CO2: 25 mmol/L (ref 20–29)
Calcium: 9.4 mg/dL (ref 8.7–10.3)
Chloride: 102 mmol/L (ref 96–106)
Creatinine, Ser: 0.92 mg/dL (ref 0.57–1.00)
Globulin, Total: 1.9 g/dL (ref 1.5–4.5)
Glucose: 190 mg/dL — ABNORMAL HIGH (ref 70–99)
Potassium: 5 mmol/L (ref 3.5–5.2)
Sodium: 141 mmol/L (ref 134–144)
Total Protein: 6 g/dL (ref 6.0–8.5)
eGFR: 66 mL/min/{1.73_m2} (ref >59)

## 2020-11-05 LAB — LIPID PANEL
Chol/HDL Ratio: 2.6 ratio (ref 0.0–4.4)
Cholesterol, Total: 133 mg/dL (ref 100–199)
HDL: 51 mg/dL (ref >39)
LDL Chol Calc (NIH): 68 mg/dL (ref 0–99)
Triglycerides: 69 mg/dL (ref 0–149)
VLDL Cholesterol Cal: 14 mg/dL (ref 5–40)

## 2020-11-05 LAB — HEMOGLOBIN A1C
Est. average glucose Bld gHb Est-mCnc: 134 mg/dL
Hgb A1c MFr Bld: 6.3 % — ABNORMAL HIGH (ref 4.8–5.6)

## 2020-11-05 LAB — T4, FREE: Free T4: 1.57 ng/dL (ref 0.82–1.77)

## 2020-11-05 LAB — TSH: TSH: 1.94 u[IU]/mL (ref 0.450–4.500)

## 2020-11-05 NOTE — Progress Notes (Signed)
Hi Emma Berry. It was good to see you yesterday.  Your A1c remains well controlled at 6.3.  Your other blood work also looks great.  Keep up the good work.  I will see you at your next visit.

## 2020-11-17 ENCOUNTER — Telehealth: Payer: Self-pay | Admitting: Nurse Practitioner

## 2020-11-17 DIAGNOSIS — M5431 Sciatica, right side: Secondary | ICD-10-CM

## 2020-11-17 NOTE — Telephone Encounter (Signed)
Copied from CRM [id number]. Topic: General - Other >> Nov 17, 2020 11:58 AM Jaquita Rector A wrote: Reason for CRM: Patient called in to inform Larae Grooms that her knee is a little bit swollen and she does not know what her next step is. Per patient the Gabapentin did help a little with the pain but she need to know what will be the next step for the swelling asking for a call back at Ph#  [phone]

## 2020-11-17 NOTE — Telephone Encounter (Signed)
Routing to provider to advise.  

## 2020-11-18 NOTE — Telephone Encounter (Signed)
Please let patient I have placed an order for her to see Orthopedics.

## 2020-11-18 NOTE — Telephone Encounter (Signed)
Patient notified. States she scheduled an appointment with Clydie Braun and would like to discuss with her further at the appointment. States she feels like her knee is getting a little better.

## 2020-11-19 NOTE — Progress Notes (Signed)
BP 110/62   Pulse 75   Temp (!) 97.2 F (36.2 C) (Oral)   Ht _0  (1.575 m)   Wt 159 lb 3.2 oz (72.2 kg)   LMP  (LMP Unknown)   SpO2 100%   BMI 29.12 kg/m    Subjective:    Patient ID: Emma Berry, female    DOB: 13-Apr-1950, 70 y.o.   MRN: [id number]  HPI: Emma Berry is a 70 y.o. female  Chief Complaint  Patient presents with   Knee Pain    Pt states she has been having R knee pain and swelling for a while. States it has gotten a little bit better but is still there.    Patient states she is still having the knee pain.  Patient states the gabapentin did help some.  However, she is still having some swelling, it hurts to pick it up and move it. It hurts up into her knee.  She does not remember hurting her knee.    Relevant past medical, surgical, family and social history reviewed and updated as indicated. Interim medical history since our last visit reviewed. Allergies and medications reviewed and updated.  Review of Systems  Skin:        Right knee pain   Per HPI unless specifically indicated above     Objective:    BP 110/62   Pulse 75   Temp (!) 97.2 F (36.2 C) (Oral)   Ht _1  (1.575 m)   Wt 159 lb 3.2 oz (72.2 kg)   LMP  (LMP Unknown)   SpO2 100%   BMI 29.12 kg/m   Wt Readings from Last 3 Encounters:  11/20/20 159 lb 3.2 oz (72.2 kg)  11/04/20 159 lb 2 oz (72.2 kg)  07/01/20 158 lb 6 oz (71.8 kg)    Physical Exam Vitals and nursing note reviewed.  Constitutional:      General: She is not in acute distress.    Appearance: Normal appearance. She is normal weight. She is not ill-appearing, toxic-appearing or diaphoretic.  HENT:     Head: Normocephalic.     Right Ear: External ear normal.     Left Ear: External ear normal.     Nose: Nose normal.     Mouth/Throat:     Mouth: Mucous membranes are moist.     Pharynx: Oropharynx is clear.  Eyes:     General:        Right eye: No discharge.        Left eye: No discharge.     Extraocular  Movements: Extraocular movements intact.     Conjunctiva/sclera: Conjunctivae normal.     Pupils: Pupils are equal, round, and reactive to light.  Cardiovascular:     Rate and Rhythm: Normal rate and regular rhythm.     Heart sounds: No murmur heard. Pulmonary:     Effort: Pulmonary effort is normal. No respiratory distress.     Breath sounds: Normal breath sounds. No wheezing or rales.  Musculoskeletal:        General: Swelling and tenderness present. No deformity or signs of injury.     Cervical back: Normal range of motion and neck supple.  Skin:    General: Skin is warm and dry.     Capillary Refill: Capillary refill takes less than 2 seconds.  Neurological:     General: No focal deficit present.     Mental Status: She is alert and oriented to person, place, and time.  Mental status is at baseline.  Psychiatric:        Mood and Affect: Mood normal.        Behavior: Behavior normal.        Thought Content: Thought content normal.        Judgment: Judgment normal.    Results for orders placed or performed in visit on 11/04/20  Comp Met (CMET)  Result Value Ref Range   Glucose 190 (H) 70 - 99 mg/dL   BUN 19 8 - 27 mg/dL   Creatinine, Ser 0.92 0.57 - 1.00 mg/dL   eGFR 66 >59 mL/min/1.73   BUN/Creatinine Ratio 21 12 - 28   Sodium 141 134 - 144 mmol/L   Potassium 5.0 3.5 - 5.2 mmol/L   Chloride 102 96 - 106 mmol/L   CO2 25 20 - 29 mmol/L   Calcium 9.4 8.7 - 10.3 mg/dL   Total Protein 6.0 6.0 - 8.5 g/dL   Albumin 4.1 3.7 - 4.7 g/dL   Globulin, Total 1.9 1.5 - 4.5 g/dL   Albumin/Globulin Ratio 2.2 1.2 - 2.2   Bilirubin Total 0.2 0.0 - 1.2 mg/dL   Alkaline Phosphatase 67 44 - 121 IU/L   AST 14 0 - 40 IU/L   ALT 9 0 - 32 IU/L  Lipid Profile  Result Value Ref Range   Cholesterol, Total 133 100 - 199 mg/dL   Triglycerides 69 0 - 149 mg/dL   HDL 51 >39 mg/dL   VLDL Cholesterol Cal 14 5 - 40 mg/dL   LDL Chol Calc (NIH) 68 0 - 99 mg/dL   Chol/HDL Ratio 2.6 0.0 - 4.4 ratio   HgB A1c  Result Value Ref Range   Hgb A1c MFr Bld 6.3 (H) 4.8 - 5.6 %   Est. average glucose Bld gHb Est-mCnc 134 mg/dL  TSH  Result Value Ref Range   TSH 1.940 0.450 - 4.500 uIU/mL  T4, free  Result Value Ref Range   Free T4 1.57 0.82 - 1.77 ng/dL      Assessment & Plan:   Problem List Items Addressed This Visit   None Visit Diagnoses     Acute pain of right knee    -  Primary   Discussed referral to Orthopedics.  Patient will call them today to make an appointment. Patient understands and agrees with the plan of care.         Follow up plan: Return if symptoms worsen or fail to improve.

## 2020-11-20 ENCOUNTER — Other Ambulatory Visit: Payer: Self-pay

## 2020-11-20 ENCOUNTER — Ambulatory Visit (INDEPENDENT_AMBULATORY_CARE_PROVIDER_SITE_OTHER): Payer: Medicare HMO | Admitting: Nurse Practitioner

## 2020-11-20 ENCOUNTER — Encounter: Payer: Self-pay | Admitting: Nurse Practitioner

## 2020-11-20 VITALS — BP 110/62 | HR 75 | Temp 97.2°F | Ht 62.0 in | Wt 159.2 lb

## 2020-11-20 DIAGNOSIS — M25561 Pain in right knee: Secondary | ICD-10-CM | POA: Diagnosis not present

## 2020-11-20 MED ORDER — GABAPENTIN 300 MG PO CAPS: 300.0000 mg | ORAL_CAPSULE | Freq: Three times a day (TID) | ORAL | 1 refills | Status: DC

## 2020-11-24 DIAGNOSIS — S76311D Strain of muscle, fascia and tendon of the posterior muscle group at thigh level, right thigh, subsequent encounter: Secondary | ICD-10-CM | POA: Diagnosis not present

## 2020-11-24 DIAGNOSIS — M545 Low back pain, unspecified: Secondary | ICD-10-CM | POA: Diagnosis not present

## 2020-11-24 DIAGNOSIS — M1711 Unilateral primary osteoarthritis, right knee: Secondary | ICD-10-CM | POA: Diagnosis not present

## 2020-12-01 ENCOUNTER — Ambulatory Visit (INDEPENDENT_AMBULATORY_CARE_PROVIDER_SITE_OTHER): Payer: Medicare HMO | Admitting: *Deleted

## 2020-12-01 DIAGNOSIS — Z780 Asymptomatic menopausal state: Secondary | ICD-10-CM

## 2020-12-01 DIAGNOSIS — Z1231 Encounter for screening mammogram for malignant neoplasm of breast: Secondary | ICD-10-CM | POA: Diagnosis not present

## 2020-12-01 DIAGNOSIS — Z0000 Encounter for general adult medical examination without abnormal findings: Secondary | ICD-10-CM

## 2020-12-01 NOTE — Progress Notes (Signed)
Subjective:   Emma Berry is a 70 y.o. female who presents for Medicare Annual (Subsequent) preventive examination.  I connected with  Emma Berry on 12/01/20 by a telephone enabled telemedicine application and verified that I am speaking with the correct person using two identifiers.   I discussed the limitations of evaluation and management by telemedicine. The patient expressed understanding and agreed to proceed.  Patient location: home  Provider location: tele-health  not in office    Review of Systems     Cardiac Risk Factors include: advanced age (>32men, >64 women);diabetes mellitus;hypertension     Objective:    Today's Vitals   There is no height or weight on file to calculate BMI.  Advanced Directives 12/01/2020 11/30/2019 11/29/2018 11/24/2017 10/31/2017 10/04/2017 05/03/2017  Does Patient Have a Medical Advance Directive? No No Yes No No No No  Type of Advance Directive - - Living will;Healthcare Power of Attorney - - - -  Copy of Healthcare Power of Attorney in Chart? - - Yes - validated most recent copy scanned in chart (See row information) - - - -  Would patient like information on creating a medical advance directive? Yes (MAU/Ambulatory/Procedural Areas - Information given);No - Patient declined - - Yes (MAU/Ambulatory/Procedural Areas - Information given) No - Patient declined No - Patient declined No - Patient declined    Current Medications (verified) Outpatient Encounter Medications as of 12/01/2020  Medication Sig   aspirin EC 81 MG tablet Take 81 mg by mouth at bedtime.   calcium carbonate (OS-CAL) 600 MG TABS tablet Take 600 mg by mouth at bedtime.   Cholecalciferol (VITAMIN D3) 5000 units CAPS Take 1 capsule by mouth daily.   ferrous gluconate (FERGON) 225 (27 Fe) MG tablet Take 240 mg by mouth daily.   gabapentin (NEURONTIN) 300 MG capsule Take 1 capsule (300 mg total) by mouth 3 (three) times daily.   glyBURIDE (DIABETA) 5 MG tablet Take 1  tablet (5 mg total) by mouth daily with breakfast.   levothyroxine (SYNTHROID) 75 MCG tablet TAKE 1 TABLET BY MOUTH ONCE DAILY BEFORE BREAKFAST.   liraglutide (VICTOZA) 18 MG/3ML SOPN INJECT 1.8MG  S.Q. ONCE DAILY.   metFORMIN (GLUCOPHAGE-XR) 500 MG 24 hr tablet TAKE 2 TABLETS BY MOUTH IN THE MORNING AND AT BEDTIME   ONE TOUCH ULTRA TEST test strip CHECK BLOOD SUGAR ONCE DAILY   pioglitazone (ACTOS) 30 MG tablet TAKE (1) TABLET BY MOUTH DAILY FOR DIABETES   pravastatin (PRAVACHOL) 40 MG tablet TAKE 1 TABLET BY MOUTH AT BEDTIME FOR CHOLESTEROL   quinapril (ACCUPRIL) 10 MG tablet Take 1 tablet (10 mg total) by mouth daily.   raloxifene (EVISTA) 60 MG tablet Take 1 tablet (60 mg total) by mouth daily.   vitamin B-12 (CYANOCOBALAMIN) 1000 MCG tablet Take 1,000 mcg by mouth at bedtime.   No facility-administered encounter medications on file as of 12/01/2020.    Allergies (verified) Patient has no known allergies.   History: Past Medical History:  Diagnosis Date   Chronic kidney disease    Diabetes mellitus without complication (HCC)    Hyperlipidemia    Hypertension    Hypothyroidism    Osteopenia    Osteoporosis    Thyroid disease    Past Surgical History:  Procedure Laterality Date   CESAREAN SECTION     COLONOSCOPY WITH PROPOFOL N/A 05/03/2017   Procedure: COLONOSCOPY WITH PROPOFOL;  Surgeon: [name], MD;  Location[facility] END[facility]COPY;  Service: Endoscopy;  Laterality: N/A;   VENTRAL  HERNIA REPAIR N/A 10/31/2017   Procedure: LAPAROSCOPIC VENTRAL HERNIA;  Surgeon: Carolan Shiver, MD;  Location: [facility] ORS;  Service: General;  Laterality: N/A;   VENTRAL HERNIA REPAIR N/A 10/31/2017   Procedure: HERNIA REPAIR VENTRAL ADULT;  Surgeon: Carolan Shiver, MD;  Location: [facility] ORS;  Service: General;  Laterality: N/A;   Family History  Problem Relation Age of Onset   Cancer Mother 12       melanoma   Diabetes Mother    Thyroid disease Mother    Cancer Father 22        prostate to bone   Diabetes Daughter    Heart disease Maternal Grandfather    Stroke Paternal Grandmother    Stroke Paternal Grandfather    Breast cancer Neg Hx    Social History   Socioeconomic History   Marital status: Married    Spouse name: Not on file   Number of children: Not on file   Years of education: Not on file   Highest education level: Not on file  Occupational History   Occupation: retired  Tobacco Use   Smoking status: Never   Smokeless tobacco: Never  Vaping Use   Vaping Use: Never used  Substance and Sexual Activity   Alcohol use: No    Alcohol/week: 0.0 standard drinks   Drug use: No   Sexual activity: Yes  Other Topics Concern   Not on file  Social History Narrative   Not on file   Social Determinants of Health   Financial Resource Strain: Low Risk    Difficulty of Paying Living Expenses: Not hard at all  Food Insecurity: No Food Insecurity   Worried About Programme researcher, broadcasting/film/video in the Last Year: Never true   Ran Out of Food in the Last Year: Never true  Transportation Needs: No Transportation Needs   Lack of Transportation (Medical): No   Lack of Transportation (Non-Medical): No  Physical Activity: Inactive   Days of Exercise per Week: 0 days   Minutes of Exercise per Session: 0 min  Stress: No Stress Concern Present   Feeling of Stress : Not at all  Social Connections: Socially Integrated   Frequency of Communication with Friends and Family: Not on file   Frequency of Social Gatherings with Friends and Family: More than three times a week   Attends Religious Services: More than 4 times per year   Active Member of [employer] or Organizations: Yes   Attends Engineer, structural: More than 4 times per year   Marital Status: Married    Tobacco Counseling Counseling given: Not Answered   Clinical Intake:  Pre-visit preparation completed: Yes  Pain : No/denies pain     Diabetes: No  How often do you need to have someone help you  when you read instructions, pamphlets, or other written materials from your doctor or pharmacy?: 1 - Never  Diabetic?  Yes   Nutrition Risk Assessment:  Has the patient had any N/V/D within the last 2 months?  No  Does the patient have any non-healing wounds?  No  Has the patient had any unintentional weight loss or weight gain?  No   Diabetes:  Is the patient diabetic?  Yes  If diabetic, was a CBG obtained today?  No  Did the patient bring in their glucometer from home?  No  How often do you monitor your CBG's? 1 x daily.   Financial Strains and Diabetes Management:  Are you having any financial strains with  the device, your supplies or your medication? No .  Does the patient want to be seen by Chronic Care Management for management of their diabetes?  No  Would the patient like to be referred to a Nutritionist or for Diabetic Management?  No   Diabetic Exams:  Diabetic Eye Exam: Completed . Overdue for diabetic eye exam. Pt has been advised about the importance in completing this exam  Diabetic Foot Exam: . Pt has been advised about the importance in completing this exam.     Information entered by :: Remi Haggard LPN   Activities of Daily Living In your present state of health, do you have any difficulty performing the following activities: 12/01/2020 12/26/2019  Hearing? N N  Vision? N Y  Difficulty concentrating or making decisions? N N  Walking or climbing stairs? N N  Dressing or bathing? N N  Doing errands, shopping? N N  Preparing Food and eating ? N -  Using the Toilet? N -  In the past six months, have you accidently leaked urine? N -  Do you have problems with loss of bowel control? N -  Managing your Medications? N -  Managing your Finances? N -  Housekeeping or managing your Housekeeping? N -  Some recent data might be hidden    Patient Care Team: Larae Grooms, NP as PCP - General (Nurse Practitioner)  Indicate any recent Medical Services you  may have received from other than [facility] providers in the past year (date may be approximate).     Assessment:   This is a routine wellness examination for Emma Berry.  Hearing/Vision screen Hearing Screening - Comments:: No trouble hearing Vision Screening - Comments:: Up to date Vision   Dietary issues and exercise activities discussed: Current Exercise Habits: Home exercise routine, Type of exercise: walking, Time (Minutes): 20, Frequency (Times/Week): 3, Weekly Exercise (Minutes/Week): 60, Intensity: Mild   Goals Addressed             This Visit's Progress    Increase physical activity       Patient Stated   On track    11/30/2019, drink more water       Depression Screen PHQ 2/9 Scores 12/01/2020 11/20/2020 11/04/2020 11/04/2020 12/26/2019 11/30/2019 11/29/2018  PHQ - 2 Score 0 0 0 0 0 0 0  PHQ- 9 Score 0 0 0 - - - -    Fall Risk Fall Risk  12/01/2020 11/20/2020 12/26/2019 11/30/2019 11/29/2018  Falls in the past year? 0 0 0 0 0  Number falls in past yr: 0 0 0 - 0  Injury with Fall? 0 0 0 - 0  Risk for fall due to : - No Fall Risks - Medication side effect -  Follow up Falls evaluation completed;Falls prevention discussed Falls evaluation completed - Falls evaluation completed;Education provided;Falls prevention discussed -    FALL RISK PREVENTION PERTAINING TO THE HOME:  Any stairs in or around the home? Yes  If so, are there any without handrails? No  Home free of loose throw rugs in walkways, pet beds, electrical cords, etc? Yes  Adequate lighting in your home to reduce risk of falls? Yes   ASSISTIVE DEVICES UTILIZED TO PREVENT FALLS:  Life alert? No  Use of a cane, walker or w/c? No  Grab bars in the bathroom? Yes  Shower chair or bench in shower? Yes  Elevated toilet seat or a handicapped toilet? No   TIMED UP AND GO:  Was the test performed? No .  Cognitive Function  Normal cognitive status assessed by direct observation by this Nurse Health  Advisor. No abnormalities found.       6CIT Screen 11/30/2019 11/29/2018 11/24/2017  What Year? 0 points 0 points 0 points  What month? 0 points 0 points 0 points  What time? 0 points 0 points 0 points  Count back from 20 0 points 0 points 0 points  Months in reverse 0 points 0 points 0 points  Repeat phrase 0 points 0 points 2 points  Total Score 0 0 2    Immunizations Immunization History  Administered Date(s) Administered   Fluad Quad(high Dose 65+) 11/04/2020   Influenza, High Dose Seasonal PF 11/24/2015, 11/23/2016, 11/24/2017   Influenza,inj,Quad PF,6+ Mos 11/18/2014   Influenza-Unspecified 10/30/2018, 11/06/2019   Moderna Sars-Covid-2 Vaccination 02/07/2019, 03/07/2019, 11/13/2019   Pneumococcal Conjugate-13 11/18/2014   Pneumococcal Polysaccharide-23 04/16/2013   Td 08/19/2004   Tdap 11/18/2014   Zoster, Live 10/01/2009   Flu Vaccine status: Up to date TDAP status: Up to date  Flu Vaccine status: Up to date  Pneumococcal vaccine status: Up to date  Covid-19 vaccine status: Information provided on how to obtain vaccines.   Qualifies for Shingles Vaccine? Yes   Zostavax completed Yes   Shingrix Completed?: No.    Education has been provided regarding the importance of this vaccine. Patient has been advised to call insurance company to determine out of pocket expense if they have not yet received this vaccine. Advised may also receive vaccine at local pharmacy or Health Dept. Verbalized acceptance and understanding.  Screening Tests Health Maintenance  Topic Date Due   Zoster Vaccines- Shingrix (1 of 2) Never done   COVID-19 Vaccine (4 - Booster for Moderna series) 01/08/2020   OPHTHALMOLOGY EXAM  11/27/2020   FOOT EXAM  12/25/2020   MAMMOGRAM  01/01/2021   HEMOGLOBIN A1C  05/05/2021   TETANUS/TDAP  11/17/2024   COLONOSCOPY (Pts 45-32yrs Insurance coverage will need to be confirmed)  05/04/2027   Pneumonia Vaccine 58+ Years old  Completed   INFLUENZA VACCINE   Completed   DEXA SCAN  Completed   Hepatitis C Screening  Completed   HPV VACCINES  Aged Out    Health Maintenance  Health Maintenance Due  Topic Date Due   Zoster Vaccines- Shingrix (1 of 2) Never done   COVID-19 Vaccine (4 - Booster for Moderna series) 01/08/2020   OPHTHALMOLOGY EXAM  11/27/2020    Colorectal cancer screening: Type of screening: Colonoscopy. Completed 2019. Repeat every 10 years  Mammogram status: Ordered  . Pt provided with contact info and advised to call to schedule appt.   Bone Density status: Ordered  . Pt provided with contact info and advised to call to schedule appt.  Lung Cancer Screening: (Low Dose CT Chest recommended if Age 18-80 years, 30 pack-year currently smoking OR have quit w/in 15years.) does not qualify.   Lung Cancer Screening Referral:   Additional Screening:  Hepatitis C Screening: does not qualify; Completed 2016  Vision Screening: Recommended annual ophthalmology exams for early detection of glaucoma and other disorders of the eye. Is the patient up to date with their annual eye exam?  Yes  Who is the provider or what is the name of the office in which the patient attends annual eye exams? [facility] If pt is not established with a provider, would they like to be referred to a provider to establish care? No .   Dental Screening: Recommended annual dental exams for proper oral  hygiene  Community Resource Referral / Chronic Care Management: CRR required this visit?  No   CCM required this visit?  No      Plan:     I have personally reviewed and noted the following in the patient's chart:   Medical and social history Use of alcohol, tobacco or illicit drugs  Current medications and supplements including opioid prescriptions.  Functional ability and status Nutritional status Physical activity Advanced directives List of other physicians Hospitalizations, surgeries, and ER visits in previous 12 months Vitals Screenings  to include cognitive, depression, and falls Referrals and appointments  In addition, I have reviewed and discussed with patient certain preventive protocols, quality metrics, and best practice recommendations. A written personalized care plan for preventive services as well as general preventive health recommendations were provided to patient.     Remi Haggard, LPN   01/03/4974   Nurse Notes:

## 2020-12-16 DIAGNOSIS — M1711 Unilateral primary osteoarthritis, right knee: Secondary | ICD-10-CM | POA: Diagnosis not present

## 2020-12-16 DIAGNOSIS — M545 Low back pain, unspecified: Secondary | ICD-10-CM | POA: Diagnosis not present

## 2020-12-16 DIAGNOSIS — S76311D Strain of muscle, fascia and tendon of the posterior muscle group at thigh level, right thigh, subsequent encounter: Secondary | ICD-10-CM | POA: Diagnosis not present

## 2020-12-22 DIAGNOSIS — M1711 Unilateral primary osteoarthritis, right knee: Secondary | ICD-10-CM | POA: Diagnosis not present

## 2020-12-31 ENCOUNTER — Encounter: Payer: Medicare HMO | Admitting: Nurse Practitioner

## 2021-01-14 DIAGNOSIS — M545 Low back pain, unspecified: Secondary | ICD-10-CM | POA: Diagnosis not present

## 2021-01-14 DIAGNOSIS — M1711 Unilateral primary osteoarthritis, right knee: Secondary | ICD-10-CM | POA: Diagnosis not present

## 2021-01-14 DIAGNOSIS — S76311D Strain of muscle, fascia and tendon of the posterior muscle group at thigh level, right thigh, subsequent encounter: Secondary | ICD-10-CM | POA: Diagnosis not present

## 2021-01-21 DIAGNOSIS — M1711 Unilateral primary osteoarthritis, right knee: Secondary | ICD-10-CM | POA: Diagnosis not present

## 2021-01-21 DIAGNOSIS — M545 Low back pain, unspecified: Secondary | ICD-10-CM | POA: Diagnosis not present

## 2021-01-21 DIAGNOSIS — S76311D Strain of muscle, fascia and tendon of the posterior muscle group at thigh level, right thigh, subsequent encounter: Secondary | ICD-10-CM | POA: Diagnosis not present

## 2021-01-27 IMAGING — MG DIGITAL SCREENING BILAT W/ TOMO W/ CAD
8 series · 8 of 24 positions shown · non-contrast
Comparison: Previous exam(s).

ACR Breast Density Category a: The breast tissue is almost entirely
fatty.

CLINICAL DATA: Screening.

EXAM:
DIGITAL SCREENING BILATERAL MAMMOGRAM WITH TOMO AND CAD

[L CC synth-2D]
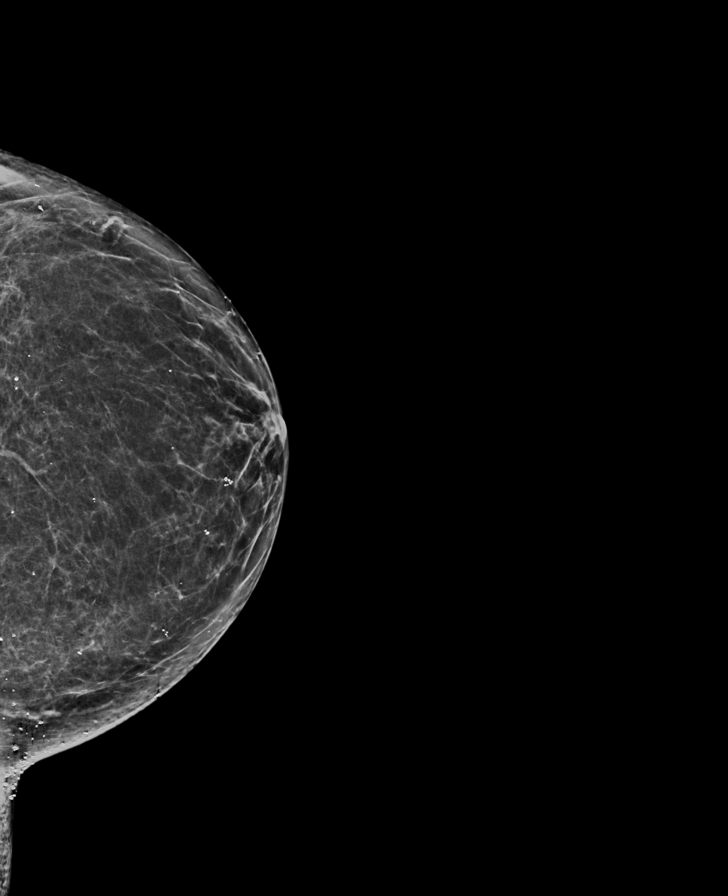

[R MLO synth-2D]
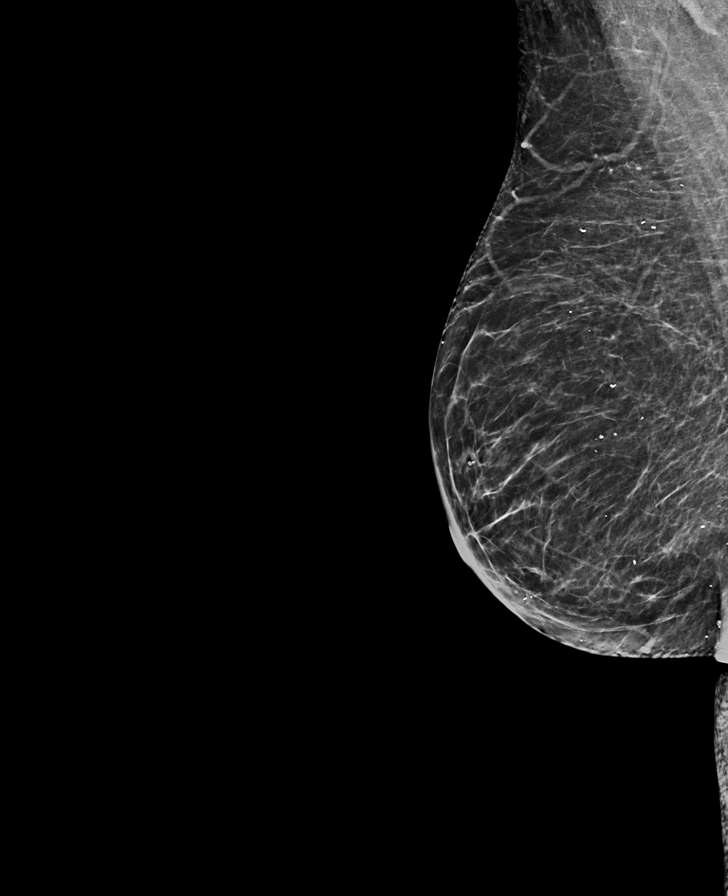

[L MLO synth-2D]
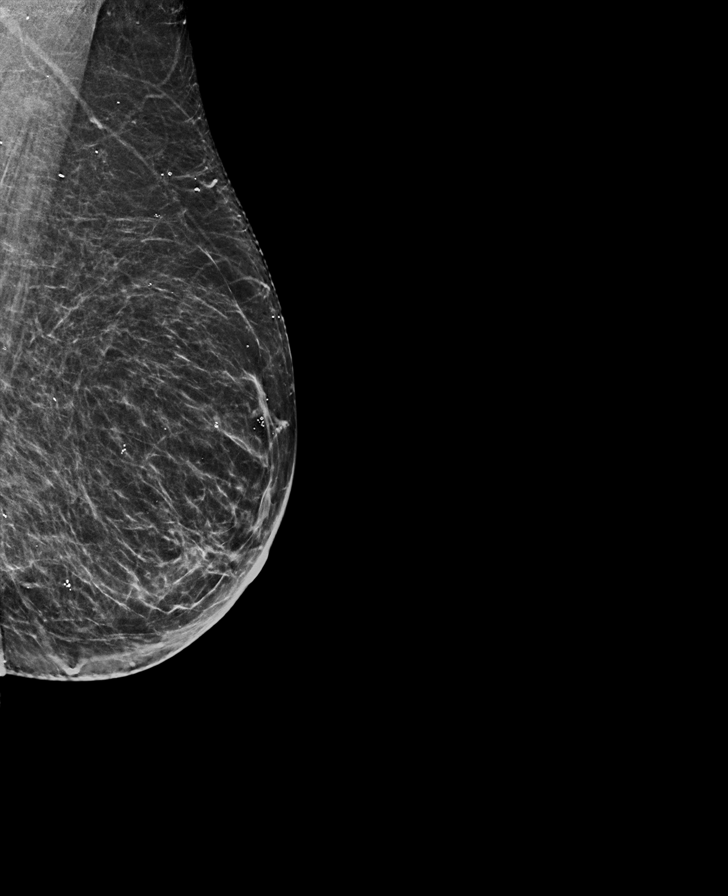

[R CC synth-2D]
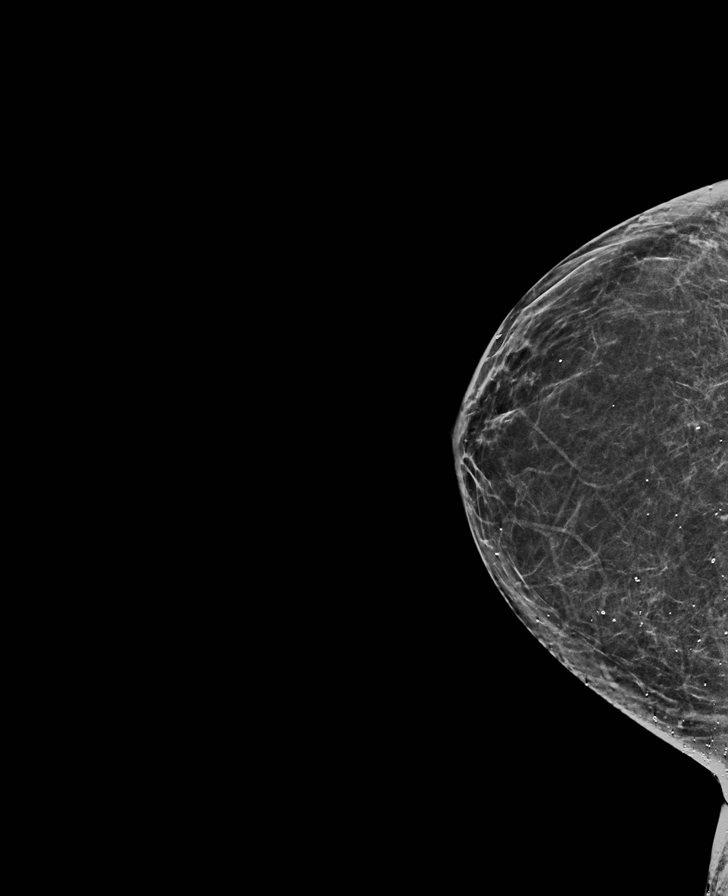

[R MLO tomo · tomo slice 33/65.0]
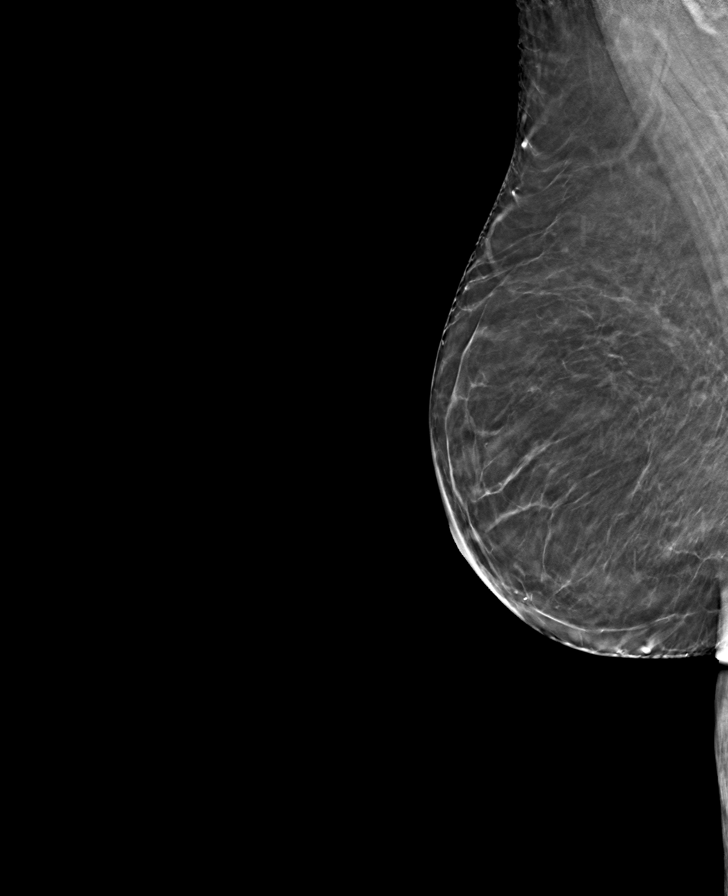

[L MLO tomo · tomo slice 34/67.0]
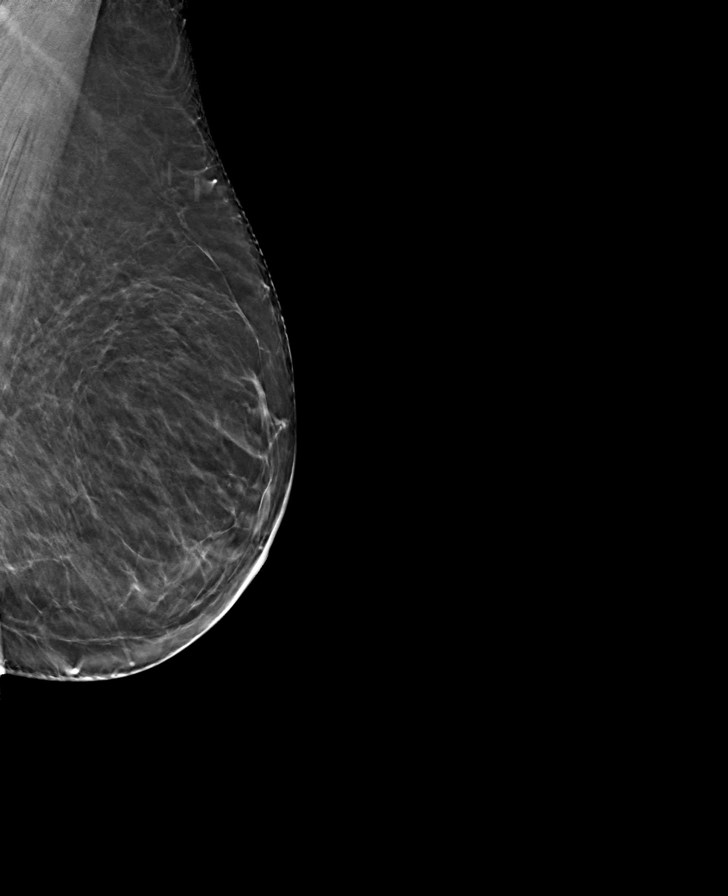

[R CC tomo · tomo slice 32/63.0]
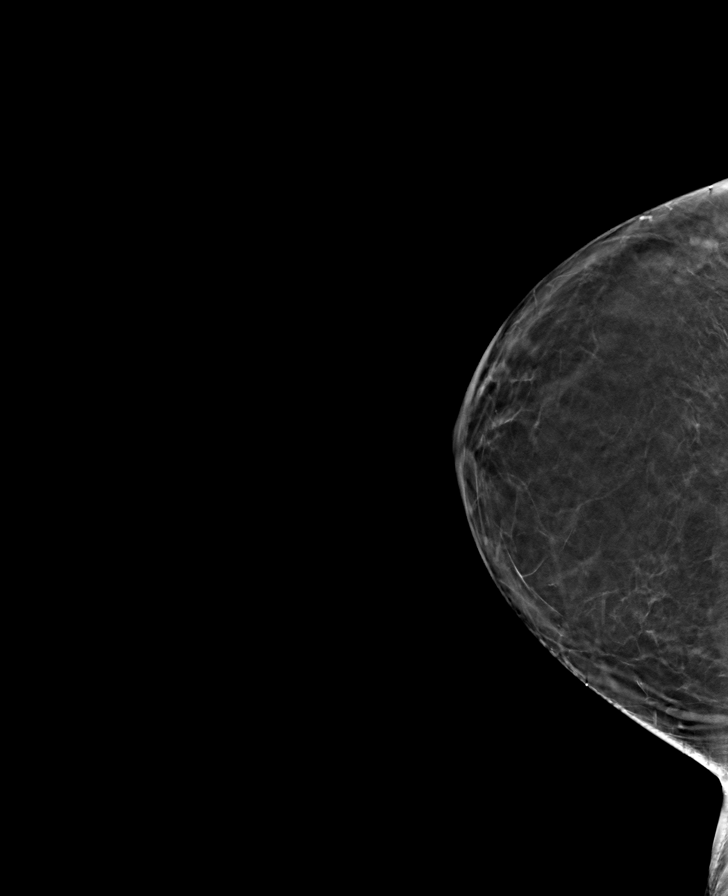

[L CC tomo · tomo slice 33/65.0]
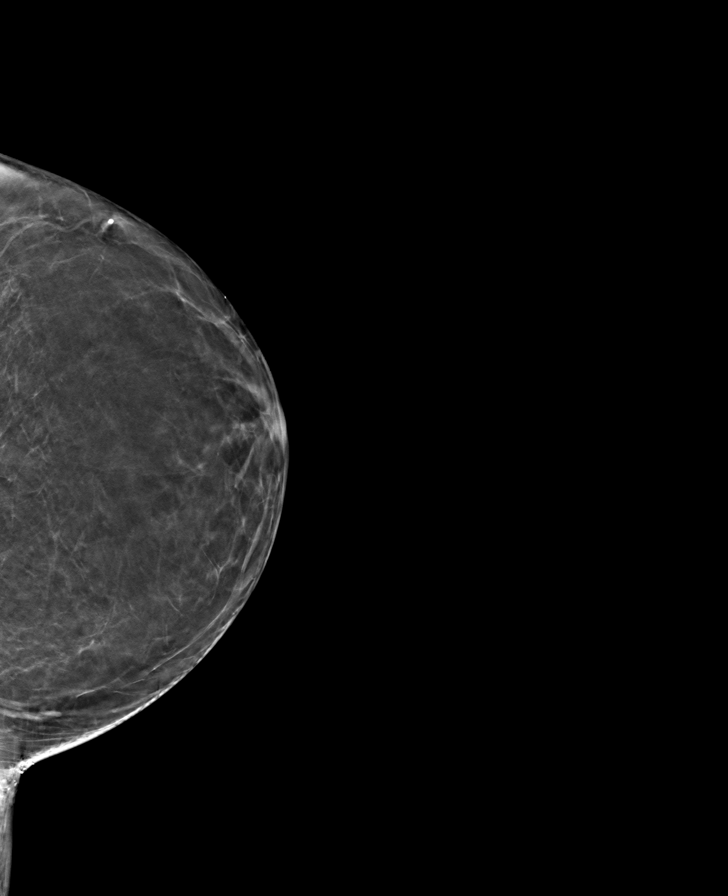

[8 of 24 positions shown; findings below may reference images not displayed]

FINDINGS: There are no findings suspicious for malignancy. Images were
processed with CAD.
IMPRESSION: No mammographic evidence of malignancy. A result letter of this
screening mammogram will be mailed directly to the patient.

RECOMMENDATION:
Screening mammogram in one year. (Code:[id number]-[id number])

BI-RADS CATEGORY  1: Negative.

## 2021-01-28 DIAGNOSIS — M545 Low back pain, unspecified: Secondary | ICD-10-CM | POA: Diagnosis not present

## 2021-01-28 DIAGNOSIS — M1711 Unilateral primary osteoarthritis, right knee: Secondary | ICD-10-CM | POA: Diagnosis not present

## 2021-01-28 DIAGNOSIS — S76311D Strain of muscle, fascia and tendon of the posterior muscle group at thigh level, right thigh, subsequent encounter: Secondary | ICD-10-CM | POA: Diagnosis not present

## 2021-02-03 ENCOUNTER — Other Ambulatory Visit: Payer: Self-pay | Admitting: Nurse Practitioner

## 2021-02-04 NOTE — Telephone Encounter (Signed)
Requested Prescriptions  Pending Prescriptions Disp Refills   levothyroxine (SYNTHROID) 75 MCG tablet [Pharmacy Med Name: SYNTHROID 75 MCG TABLET] 90 tablet 2    Sig: TAKE 1 TABLET BY MOUTH ONCE DAILY BEFORE BREAKFAST.     Endocrinology:  Hypothyroid Agents Failed - 02/03/2021  8:11 PM      Failed - TSH needs to be rechecked within 3 months after an abnormal result. Refill until TSH is due.      Passed - TSH in normal range and within 360 days    TSH  Date Value Ref Range Status  11/04/2020 1.940 0.450 - 4.500 uIU/mL Final         Passed - Valid encounter within last 12 months    Recent Outpatient Visits          2 months ago Acute pain of right knee   [facility] Jon Billings, NP   3 months ago Hypertension associated with diabetes [facility])   [facility] Jon Billings, NP   7 months ago Hypertension associated with diabetes [facility])   [facility] Jon Billings, NP   1 year ago 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)   [place], [place] T, NP   1 year ago Controlled type 2 diabetes mellitus without complication, without long-term current use of insulin [facility])   [facility], Lilia Argue, [place]      Future Appointments            In 3 months Jon Billings, NP [facility], [place]

## 2021-02-18 ENCOUNTER — Other Ambulatory Visit: Payer: Self-pay | Admitting: Nurse Practitioner

## 2021-02-18 NOTE — Telephone Encounter (Signed)
Requested Prescriptions  Pending Prescriptions Disp Refills   quinapril (ACCUPRIL) 10 MG tablet [Pharmacy Med Name: QUINAPRIL 10 MG TABLET] 90 tablet 0    Sig: TAKE 1 TABLET BY MOUTH ONCE DAILY.     Cardiovascular: ACE Inhibitors 2 Passed - 02/18/2021  8:59 AM      Passed - K in normal range and within 180 days    Potassium  Date Value Ref Range Status  11/04/2020 5.0 3.5 - 5.2 mmol/L Final         Passed - Cr in normal range and within 180 days    Creatinine  Date Value Ref Range Status  02/23/2011 0.88 0.60 - 1.30 mg/dL Final   Creatinine, Ser  Date Value Ref Range Status  11/04/2020 0.92 0.57 - 1.00 mg/dL Final         Passed - eGFR is 30 or above and within 180 days    EGFR (African American)  Date Value Ref Range Status  02/23/2011 >60 >74m/min Final   GFR calc Af Amer  Date Value Ref Range Status  12/26/2019 64 >59 mL/min/1.73 Final    Comment:    **In accordance with recommendations from the NKF-ASN Task force,**   [facility] is in the process of updating its eGFR calculation to the   2021 CKD-EPI creatinine equation that estimates kidney function   without a race variable.    EGFR (Non-African Amer.)  Date Value Ref Range Status  02/23/2011 >60 >66mmin Final    Comment:    eGFR values <6066min/1.73 m2 may be an indication of chronic kidney disease (CKD). Calculated eGFR, using the MRDR Study equation, is useful in  patients with stable renal function. The eGFR calculation will not be reliable in acutely ill patients when serum creatinine is changing rapidly. It is not useful in patients on dialysis. The eGFR calculation may not be applicable to patients at the low and high extremes of body sizes, pregnant women, and vegetarians.    GFR calc non Af Amer  Date Value Ref Range Status  12/26/2019 55 (L) >59 mL/min/1.73 Final   eGFR  Date Value Ref Range Status  11/04/2020 66 >59 mL/min/1.73 Final         Passed - Patient is not pregnant      Passed  - Last BP in normal range    BP Readings from Last 1 Encounters:  11/20/20 110/62         Passed - Valid encounter within last 6 months    Recent Outpatient Visits          3 months ago Acute pain of right knee   [facility]   3 months [facility]   7 months ago Hypertension associated with diabetes ([place] [facility]   1 year ago Type 2 diabetes mellitus with proteinuria ([place] [facility]   1 year ago Controlled type 2 diabetes mellitus without complication, without long-term current use of insulin ([facility] [place]   Future Appointments            In 2 months [facility]            pravastatin (PRAVACHOL) 40 MG tablet [Pharmacy Med Name: PRAVASTATIN SODIUM 40 MG TAB] 90 tablet 0  Sig: TAKE 1 TABLET BY MOUTH AT BEDTIME FOR CHOLESTEROL     Cardiovascular:  Antilipid - Statins Failed - 02/18/2021  8:59 AM      Failed - Lipid Panel in normal range within the last 12 months    Cholesterol, Total  Date Value Ref Range Status  11/04/2020 133 100 - 199 mg/dL Final   Cholesterol Piccolo, Waived  Date Value Ref Range Status  05/25/2016 121 <200 mg/dL Final    Comment:                            Desirable                <200                         Borderline High      200- 239                         High                     >239    LDL Chol Calc (NIH)  Date Value Ref Range Status  11/04/2020 68 0 - 99 mg/dL Final   HDL  Date Value Ref Range Status  11/04/2020 51 >39 mg/dL Final   Triglycerides  Date Value Ref Range Status  11/04/2020 69 0 - 149 mg/dL Final   Triglycerides Piccolo,Waived  Date Value Ref Range Status  05/25/2016 69 <150 mg/dL Final    Comment:                             Normal                   <150                         Borderline High     150 - 199                         High                200 - 499                         Very High                >499          Passed - Patient is not pregnant      Passed - Valid encounter within last 12 months    Recent Outpatient Visits          3 months ago Acute pain of right knee   [facility] Jon Billings, NP   3 months ago Hypertension associated with diabetes ([place])   [facility], NP   7 months ago Hypertension associated with diabetes ([place])   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[place])   [place], [place] T, NP   1 year ago Controlled type 2 diabetes mellitus without complication, without long-term current use of insulin ([place])   [place], Lilia Argue, [place]      Future Appointments  In 2 months Jon Billings, NP [facility], [facility]            pioglitazone (ACTOS) 30 MG tablet [Pharmacy Med Name: PIOGLITAZONE HCL 30 MG TABLET] 90 tablet 0    Sig: TAKE (1) TABLET BY MOUTH DAILY FOR DIABETES     Endocrinology:  Diabetes - Glitazones - pioglitazone Passed - 02/18/2021  8:59 AM      Passed - HBA1C is between 0 and 7.9 and within 180 days    Hemoglobin A1C  Date Value Ref Range Status  11/24/2015 7.2%  Final   HB A1C (BAYER DCA - WAIVED)  Date Value Ref Range Status  12/26/2019 6.2 <7.0 % Final    Comment:                                          Diabetic Adult            <7.0                                       Healthy Adult        4.3 - 5.7                                                           (DCCT/NGSP) American Diabetes Association's Summary of Glycemic Recommendations for Adults with Diabetes: Hemoglobin A1c <7.0%. More stringent glycemic goals (A1c <6.0%) may further reduce complications at  the cost of increased risk of hypoglycemia.    Hgb A1c MFr Bld  Date Value Ref Range Status  11/04/2020 6.3 (H) 4.8 - 5.6 % Final    Comment:             Prediabetes: 5.7 - 6.4          Diabetes: >6.4          Glycemic control for adults with diabetes: <7.0          Passed - Valid encounter within last 6 months    Recent Outpatient Visits          3 months ago Acute pain of right knee   [facility] Jon Billings, NP   3 months ago Hypertension associated with diabetes [facility] )   [facility] Jon Billings, NP   7 months ago Hypertension associated with diabetes ([place])   [facility] Jon Billings, NP   1 year ago 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

## 2021-02-23 DIAGNOSIS — S76311D Strain of muscle, fascia and tendon of the posterior muscle group at thigh level, right thigh, subsequent encounter: Secondary | ICD-10-CM | POA: Diagnosis not present

## 2021-02-23 DIAGNOSIS — M545 Low back pain, unspecified: Secondary | ICD-10-CM | POA: Diagnosis not present

## 2021-02-23 DIAGNOSIS — M1711 Unilateral primary osteoarthritis, right knee: Secondary | ICD-10-CM | POA: Diagnosis not present

## 2021-03-13 ENCOUNTER — Other Ambulatory Visit: Payer: Self-pay

## 2021-03-13 ENCOUNTER — Encounter: Payer: Self-pay | Admitting: Nurse Practitioner

## 2021-03-13 ENCOUNTER — Ambulatory Visit (INDEPENDENT_AMBULATORY_CARE_PROVIDER_SITE_OTHER): Payer: Medicare HMO | Admitting: Nurse Practitioner

## 2021-03-13 VITALS — BP 114/71 | HR 96 | Temp 98.8°F | Wt 151.2 lb

## 2021-03-13 DIAGNOSIS — J069 Acute upper respiratory infection, unspecified: Secondary | ICD-10-CM

## 2021-03-13 MED ORDER — AMOXICILLIN 500 MG PO CAPS: 500.0000 mg | ORAL_CAPSULE | Freq: Two times a day (BID) | ORAL | 0 refills | Status: AC

## 2021-03-13 NOTE — Progress Notes (Signed)
? ?BP 114/71   Pulse 96   Temp 98.8 ?F (37.1 ?C) (Oral)   Wt 151 lb 3.2 oz (68.6 kg)   LMP  (LMP Unknown)   SpO2 95%   BMI 27.65 kg/m?   ? ?Subjective:  ? ? Patient ID: Emma Berry, female    DOB: 06-16-1950, 71 y.o.   MRN: [id number] ? ?HPI: ?Emma Berry is a 71 y.o. female ? ?Chief Complaint  ?Patient presents with  ? URI  ?  Pt states she has had a productive cough and congestion for the last week   ? ?UPPER RESPIRATORY TRACT INFECTION ?Worst symptom: Symptoms started a week ago ?Fever: no ?Cough: yes ?Shortness of breath: no ?Wheezing: no ?Chest pain: no ?Chest tightness: no ?Chest congestion: yes ?Nasal congestion: yes ?Runny nose: yes ?Post nasal drip: yes ?Sneezing: no ?Sore throat: no ?Swollen glands: no ?Sinus pressure: yes ?Headache: yes ?Face pain: yes ?Toothache: no ?Ear pain: no bilateral ?Ear pressure: no bilateral ?Eyes red/itching:no ?Eye drainage/crusting: no  ?Vomiting: no ?Rash: no ?Fatigue: yes ?Sick contacts: no ?Strep contacts: no  ?Context: better ?Recurrent sinusitis: no ?Relief with OTC cold/cough medications: no  ?Treatments attempted:  tylenol   ? ?Relevant past medical, surgical, family and social history reviewed and updated as indicated. Interim medical history since our last visit reviewed. ?Allergies and medications reviewed and updated. ? ?Review of Systems  ?Constitutional:  Positive for fatigue. Negative for fever.  ?HENT:  Positive for congestion, postnasal drip, rhinorrhea, sinus pressure and sinus pain. Negative for dental problem, ear pain, sneezing and sore throat.   ?Respiratory:  Positive for cough. Negative for shortness of breath and wheezing.   ?Cardiovascular:  Negative for chest pain.  ?Gastrointestinal:  Negative for vomiting.  ?Skin:  Negative for rash.  ?Neurological:  Positive for headaches.  ? ?Per HPI unless specifically indicated above ? ?   ?Objective:  ?  ?BP 114/71   Pulse 96   Temp 98.8 ?F (37.1 ?C) (Oral)   Wt 151 lb 3.2 oz (68.6 kg)   LMP  (LMP  Unknown)   SpO2 95%   BMI 27.65 kg/m?   ?Wt Readings from Last 3 Encounters:  ?03/13/21 151 lb 3.2 oz (68.6 kg)  ?11/20/20 159 lb 3.2 oz (72.2 kg)  ?11/04/20 159 lb 2 oz (72.2 kg)  ?  ?Physical Exam ?Vitals and nursing note reviewed.  ?Constitutional:   ?   General: She is not in acute distress. ?   Appearance: Normal appearance. She is normal weight. She is not ill-appearing, toxic-appearing or diaphoretic.  ?HENT:  ?   Head: Normocephalic.  ?   Right Ear: Tympanic membrane and external ear normal.  ?   Left Ear: Tympanic membrane and external ear normal.  ?   Nose: Congestion and rhinorrhea present.  ?   Right Sinus: No maxillary sinus tenderness or frontal sinus tenderness.  ?   Left Sinus: No maxillary sinus tenderness or frontal sinus tenderness.  ?   Mouth/Throat:  ?   Mouth: Mucous membranes are moist.  ?   Pharynx: Oropharynx is clear. Posterior oropharyngeal erythema present. No oropharyngeal exudate.  ?Eyes:  ?   General:     ?   Right eye: No discharge.     ?   Left eye: No discharge.  ?   Extraocular Movements: Extraocular movements intact.  ?   Conjunctiva/sclera: Conjunctivae normal.  ?   Pupils: Pupils are equal, round, and reactive to light.  ?Cardiovascular:  ?  Rate and Rhythm: Normal rate and regular rhythm.  ?   Heart sounds: No murmur heard. ?Pulmonary:  ?   Effort: Pulmonary effort is normal. No respiratory distress.  ?   Breath sounds: Normal breath sounds. No wheezing or rales.  ?Musculoskeletal:  ?   Cervical back: Normal range of motion and neck supple.  ?Skin: ?   General: Skin is warm and dry.  ?   Capillary Refill: Capillary refill takes less than 2 seconds.  ?Neurological:  ?   General: No focal deficit present.  ?   Mental Status: She is alert and oriented to person, place, and time. Mental status is at baseline.  ?Psychiatric:     ?   Mood and Affect: Mood normal.     ?   Behavior: Behavior normal.     ?   Thought Content: Thought content normal.     ?   Judgment: Judgment normal.   ? ? ?Results for orders placed or performed in visit on 11/04/20  ?Comp Met (CMET)  ?Result Value Ref Range  ? Glucose 190 (H) 70 - 99 mg/dL  ? BUN 19 8 - 27 mg/dL  ? Creatinine, Ser 0.92 0.57 - 1.00 mg/dL  ? eGFR 66 >59 mL/min/1.73  ? BUN/Creatinine Ratio 21 12 - 28  ? Sodium 141 134 - 144 mmol/L  ? Potassium 5.0 3.5 - 5.2 mmol/L  ? Chloride 102 96 - 106 mmol/L  ? CO2 25 20 - 29 mmol/L  ? Calcium 9.4 8.7 - 10.3 mg/dL  ? Total Protein 6.0 6.0 - 8.5 g/dL  ? Albumin 4.1 3.7 - 4.7 g/dL  ? Globulin, Total 1.9 1.5 - 4.5 g/dL  ? Albumin/Globulin Ratio 2.2 1.2 - 2.2  ? Bilirubin Total 0.2 0.0 - 1.2 mg/dL  ? Alkaline Phosphatase 67 44 - 121 IU/L  ? AST 14 0 - 40 IU/L  ? ALT 9 0 - 32 IU/L  ?Lipid Profile  ?Result Value Ref Range  ? Cholesterol, Total 133 100 - 199 mg/dL  ? Triglycerides 69 0 - 149 mg/dL  ? HDL 51 >39 mg/dL  ? VLDL Cholesterol Cal 14 5 - 40 mg/dL  ? LDL Chol Calc (NIH) 68 0 - 99 mg/dL  ? Chol/HDL Ratio 2.6 0.0 - 4.4 ratio  ?HgB A1c  ?Result Value Ref Range  ? Hgb A1c MFr Bld 6.3 (H) 4.8 - 5.6 %  ? Est. average glucose Bld gHb Est-mCnc 134 mg/dL  ?TSH  ?Result Value Ref Range  ? TSH 1.940 0.450 - 4.500 uIU/mL  ?T4, free  ?Result Value Ref Range  ? Free T4 1.57 0.82 - 1.77 ng/dL  ? ?   ?Assessment & Plan:  ? ?Problem List Items Addressed This Visit   ?None ?Visit Diagnoses   ? ? Acute upper respiratory infection    -  Primary  ? Will treat with amoxicillin due to ongoing symptoms x 1 weeks.  Complete course of antibiotics.  Follow up if symptoms do not improve.  ? ?  ?  ? ?Follow up plan: ?Return if symptoms worsen or fail to improve. ? ? ? ? ? ?

## 2021-03-19 ENCOUNTER — Other Ambulatory Visit: Payer: Self-pay | Admitting: Nurse Practitioner

## 2021-03-19 NOTE — Telephone Encounter (Signed)
Requested medication (s) are due for refill today: yes ? ?Requested medication (s) are on the active medication list: yes ? ?Last refill:  12/26/19 #360/4  ? ?Future visit scheduled: yes ? ?Notes to clinic:  Unable to refill per protocol due to failed labs, no updated results. ? ? ? ?  ?Requested Prescriptions  ?Pending Prescriptions Disp Refills  ? metFORMIN (GLUCOPHAGE-XR) 500 MG 24 hr tablet [Pharmacy Med Name: METFORMIN HCL ER 500 MG TAB] 360 tablet 0  ?  Sig: TAKE 2 TABLETS BY MOUTH IN THE MORNING AND AT BEDTIME  ?  ? Endocrinology:  Diabetes - Biguanides Failed - 03/19/2021  7:17 AM  ?  ?  Failed - B12 Level in normal range and within 720 days  ?  Vitamin B-12  ?Date Value Ref Range Status  ?12/26/2019 >2000 (H) 232 - 1245 pg/mL Final  ?  ?  ?  ?  Failed - CBC within normal limits and completed in the last 12 months  ?  WBC  ?Date Value Ref Range Status  ?12/26/2019 5.1 3.4 - 10.8 x10E3/uL Final  ?02/23/2011 5.2 3.6 - 11.0 x10 3/mm  Final  ? ?RBC  ?Date Value Ref Range Status  ?12/26/2019 3.55 (L) 3.77 - 5.28 x10E6/uL Final  ?  Comment:  ?  CBC results reported were obtained after the specimen had been warmed ?to 37 degrees C.  This may indicate the presence of Cold Agglutinins. ?  ?02/23/2011 3.52 (L) 3.80 - 5.20 x10 6/mm  Final  ? ?Hemoglobin  ?Date Value Ref Range Status  ?12/26/2019 11.8 11.1 - 15.9 g/dL Final  ? ?Hematocrit  ?Date Value Ref Range Status  ?12/26/2019 36.6 34.0 - 46.6 % Final  ? ?MCHC  ?Date Value Ref Range Status  ?12/26/2019 32.2 31.5 - 35.7 g/dL Final  ?02/23/2011 34.6 32.0 - 36.0 g/dL Final  ? ?MCH  ?Date Value Ref Range Status  ?12/26/2019 33.2 (H) 26.6 - 33.0 pg Final  ?02/23/2011 34.6 (H) 26.0 - 34.0 pg Final  ? ?MCV  ?Date Value Ref Range Status  ?12/26/2019 103 (H) 79 - 97 fL Final  ?02/23/2011 100 80 - 100 fL Final  ? ?No results found for: PLTCOUNTKUC, LABPLAT, Jones ?RDW  ?Date Value Ref Range Status  ?12/26/2019 12.6 11.7 - 15.4 % Final  ?02/23/2011 13.8 11.5 - 14.5 % Final  ? ?   ?  ?  Passed - Cr in normal range and within 360 days  ?  Creatinine  ?Date Value Ref Range Status  ?02/23/2011 0.88 0.60 - 1.30 mg/dL Final  ? ?Creatinine, Ser  ?Date Value Ref Range Status  ?11/04/2020 0.92 0.57 - 1.00 mg/dL Final  ?  ?  ?  ?  Passed - HBA1C is between 0 and 7.9 and within 180 days  ?  Hemoglobin A1C  ?Date Value Ref Range Status  ?11/24/2015 7.2%  Final  ? ?HB A1C (BAYER DCA - WAIVED)  ?Date Value Ref Range Status  ?12/26/2019 6.2 <7.0 % Final  ?  Comment:  ?                                        Diabetic Adult            <7.0 ?  Healthy Adult        4.3 - 5.7 ?                                                          (DCCT/NGSP) ?American Diabetes Association's Summary of Glycemic Recommendations ?for Adults with Diabetes: Hemoglobin A1c <7.0%. More stringent ?glycemic goals (A1c <6.0%) may further reduce complications at the ?cost of increased risk of hypoglycemia. ?  ? ?Hgb A1c MFr Bld  ?Date Value Ref Range Status  ?11/04/2020 6.3 (H) 4.8 - 5.6 % Final  ?  Comment:  ?           Prediabetes: 5.7 - 6.4 ?         Diabetes: >6.4 ?         Glycemic control for adults with diabetes: <7.0 ?  ?  ?  ?  ?  Passed - eGFR in normal range and within 360 days  ?  EGFR (African American)  ?Date Value Ref Range Status  ?02/23/2011 >60 >72m/min Final  ? ?GFR calc Af AWyvonnia Lora ?Date Value Ref Range Status  ?12/26/2019 64 >59 mL/min/1.73 Final  ?  Comment:  ?  **In accordance with recommendations from the NKF-ASN Task force,** ?  [facility] is in the process of updating its eGFR calculation to the ?  2021 CKD-EPI creatinine equation that estimates kidney function ?  without a race variable. ?  ? ?EGFR (Non-African Amer.)  ?Date Value Ref Range Status  ?02/23/2011 >60 >648mmin Final  ?  Comment:  ?  eGFR values <6037min/1.73 m2 may be an indication of chronic ?kidney disease (CKD). ?Calculated eGFR, using the MRDR Study equation, is useful in  ?patients with stable renal  function. ?The eGFR calculation will not be reliable in acutely ill patients ?when serum creatinine is changing rapidly. It is not useful in ?patients on dialysis. The eGFR calculation may not be applicable ?to patients at the low and high extremes of body sizes, pregnant ?women, and vegetarians. ?  ? ?GFR calc non Af Amer  ?Date Value Ref Range Status  ?12/26/2019 55 (L) >59 mL/min/1.73 Final  ? ?eGFR  ?Date Value Ref Range Status  ?11/04/2020 66 >59 mL/min/1.73 Final  ?  ?  ?  ?  Passed - Valid encounter within last 6 months  ?  Recent Outpatient Visits   ? ?      ? 6 days ago Acute upper respiratory infection  ? [facility]  ? 3 months ago Acute pain of right knee  ? [place]  ? 4 months [facility]  ? 8 months [facility] [place] [place]  ? 1 year ago Type 2 diabetes mellitus with proteinuria ([facility]? [facility]  ? ?  ?  ?Future Appointments   ? ?        ? In 1 month [facility]  ? ?  ? ?  ?  ?  ? ?

## 2021-03-24 DIAGNOSIS — M1711 Unilateral primary osteoarthritis, right knee: Secondary | ICD-10-CM | POA: Diagnosis not present

## 2021-03-27 DIAGNOSIS — R69 Illness, unspecified: Secondary | ICD-10-CM | POA: Diagnosis not present

## 2021-04-27 ENCOUNTER — Other Ambulatory Visit: Payer: Self-pay | Admitting: Nurse Practitioner

## 2021-04-28 ENCOUNTER — Other Ambulatory Visit: Payer: Self-pay | Admitting: Nurse Practitioner

## 2021-04-28 NOTE — Telephone Encounter (Signed)
Requested Prescriptions  ?Pending Prescriptions Disp Refills  ?? VICTOZA 18 MG/3ML SOPN [Pharmacy Med Name: VICTOZA 3-PAK 18 MG/3 ML PEN] 9 mL 0  ?  Sig: INJECT 1.8MG  S.Q. ONCE DAILY.  ?  ? Endocrinology:  Diabetes - GLP-1 Receptor Agonists Passed - 04/27/2021  5:40 PM  ?  ?  Passed - HBA1C is between 0 and 7.9 and within 180 days  ?  Hemoglobin A1C  ?Date Value Ref Range Status  ?11/24/2015 7.2%  Final  ? ?HB A1C (BAYER DCA - WAIVED)  ?Date Value Ref Range Status  ?12/26/2019 6.2 <7.0 % Final  ?  Comment:  ?                                        Diabetic Adult            <7.0 ?                                      Healthy Adult        4.3 - 5.7 ?                                                          (DCCT/NGSP) ?American Diabetes Association's Summary of Glycemic Recommendations ?for Adults with Diabetes: Hemoglobin A1c <7.0%. More stringent ?glycemic goals (A1c <6.0%) may further reduce complications at the ?cost of increased risk of hypoglycemia. ?  ? ?Hgb A1c MFr Bld  ?Date Value Ref Range Status  ?11/04/2020 6.3 (H) 4.8 - 5.6 % Final  ?  Comment:  ?           Prediabetes: 5.7 - 6.4 ?         Diabetes: >6.4 ?         Glycemic control for adults with diabetes: <7.0 ?  ?   ?  ?  Passed - Valid encounter within last 6 months  ?  Recent Outpatient Visits   ?      ? 1 month ago Acute upper respiratory infection  ? [facility] Larae Grooms, NP  ? 5 months ago Acute pain of right knee  ? [facility] Larae Grooms, NP  ? 5 months ago Hypertension associated with diabetes [facility])  ? [facility] Larae Grooms, NP  ? 10 months ago Hypertension associated with diabetes [facility])  ? [facility] Larae Grooms, NP  ? 1 year ago Type 2 diabetes mellitus with proteinuria (HCC)  ? [facility] [place], Corrie Dandy T, NP  ?  ?  ?Future Appointments   ?        ? In 1 week Larae Grooms, NP [facility]  ?  ? ?  ?  ?  ? ? ?

## 2021-04-29 NOTE — Telephone Encounter (Signed)
Requested medication (s) are due for refill today:   Yes ? ?Requested medication (s) are on the active medication list:   Yes ? ?Future visit scheduled:   Yes on 05/05/2021 ? ? ?Last ordered: 04/21/2020 #90, 4 refills ? ?Returned because A1C is due per protocol.    ? ?Requested Prescriptions  ?Pending Prescriptions Disp Refills  ? glyBURIDE (DIABETA) 5 MG tablet [Pharmacy Med Name: GLYBURIDE 5 MG TABLET] 90 tablet 0  ?  Sig: TAKE 1 TABLET BY MOUTH ONCE DAILY.  ?  ? Endocrinology:  Diabetes - Sulfonylureas - glyburide Failed - 04/28/2021  8:55 PM  ?  ?  Failed - HBA1C in normal range and within 180 days  ?  Hemoglobin A1C  ?Date Value Ref Range Status  ?11/24/2015 7.2%  Final  ? ?HB A1C (BAYER DCA - WAIVED)  ?Date Value Ref Range Status  ?12/26/2019 6.2 <7.0 % Final  ?  Comment:  ?                                        Diabetic Adult            <7.0 ?                                      Healthy Adult        4.3 - 5.7 ?                                                          (DCCT/NGSP) ?American Diabetes Association's Summary of Glycemic Recommendations ?for Adults with Diabetes: Hemoglobin A1c <7.0%. More stringent ?glycemic goals (A1c <6.0%) may further reduce complications at the ?cost of increased risk of hypoglycemia. ?  ? ?Hgb A1c MFr Bld  ?Date Value Ref Range Status  ?11/04/2020 6.3 (H) 4.8 - 5.6 % Final  ?  Comment:  ?           Prediabetes: 5.7 - 6.4 ?         Diabetes: >6.4 ?         Glycemic control for adults with diabetes: <7.0 ?  ?  ?  ?  ?  Passed - Cr in normal range and within 360 days  ?  Creatinine  ?Date Value Ref Range Status  ?02/23/2011 0.88 0.60 - 1.30 mg/dL Final  ? ?Creatinine, Ser  ?Date Value Ref Range Status  ?11/04/2020 0.92 0.57 - 1.00 mg/dL Final  ?  ?  ?  ?  Passed - eGFR is 60 or above and within 360 days  ?  EGFR (African American)  ?Date Value Ref Range Status  ?02/23/2011 >60 >28m/min Final  ? ?GFR calc Af AWyvonnia Lora ?Date Value Ref Range Status  ?12/26/2019 64 >59 mL/min/1.73 Final   ?  Comment:  ?  **In accordance with recommendations from the NKF-ASN Task force,** ?  [facility] is in the process of updating its eGFR calculation to the ?  2021 CKD-EPI creatinine equation that estimates kidney function ?  without a race variable. ?  ? ?EGFR (Non-African Amer.)  ?Date Value Ref Range Status  ?02/23/2011 >60 >699mmin Final  ?  Comment:  ?  eGFR values <81m/min/1.73 m2 may be an indication of chronic ?kidney disease (CKD). ?Calculated eGFR, using the MRDR Study equation, is useful in  ?patients with stable renal function. ?The eGFR calculation will not be reliable in acutely ill patients ?when serum creatinine is changing rapidly. It is not useful in ?patients on dialysis. The eGFR calculation may not be applicable ?to patients at the low and high extremes of body sizes, pregnant ?women, and vegetarians. ?  ? ?GFR calc non Af Amer  ?Date Value Ref Range Status  ?12/26/2019 55 (L) >59 mL/min/1.73 Final  ? ?eGFR  ?Date Value Ref Range Status  ?11/04/2020 66 >59 mL/min/1.73 Final  ?  ?  ?  ?  Passed - Valid encounter within last 6 months  ?  Recent Outpatient Visits   ? ?      ? 1 month ago Acute upper respiratory infection  ? [place] NP  ? 5 months ago Acute pain of right knee  ? [place]  ? 5 months ago Hypertension associated with diabetes ([facility]  ? [place]  ? 10 months ago Hypertension associated with diabetes ([facility]  ? CEros NP  ? 1 year ago Type 2 diabetes mellitus with proteinuria (HP[place]  ? [facility] JHenrine ScrewsT, NP  ? ?  ?  ?Future Appointments   ? ?        ? In 6 days [facility]  ? ?  ? ? ?  ?  ?  ? ?

## 2021-05-04 NOTE — Progress Notes (Signed)
? ?BP 121/65   Pulse 75   Temp 97.8 ?F (36.6 ?C) (Oral)   Ht 5\' 2"  (1.575 m)   Wt 150 lb 6.4 oz (68.2 kg)   LMP  (LMP Unknown)   SpO2 94%   BMI 27.51 kg/m?   ? ?Subjective:  ? ? Patient ID: Emma Berry, female    DOB: [birth year], 71 y.o.   MRN: [id number] ? ?HPI: ?Emma Berry is a 71 y.o. female presenting on 05/05/2021 for comprehensive medical examination. Current medical complaints include:none ? ?She currently lives with: ?Menopausal Symptoms: no ? ?HYPERTENSION / HYPERLIPIDEMIA ?Satisfied with current treatment? yes ?Duration of hypertension: years ?BP monitoring frequency: daily ?BP range: 120/70-80 ?BP medication side effects: no ?Past BP meds: quinapril ?Duration of hyperlipidemia: years ?Cholesterol medication side effects: no ?Cholesterol supplements: none ?Past cholesterol medications: pravastatin (pravachol) ?Medication compliance: excellent compliance ?Aspirin: no ?Recent stressors: no ?Recurrent headaches: no ?Visual changes: no ?Palpitations: no ?Dyspnea: no ?Chest pain: no ?Lower extremity edema: no ?Dizzy/lightheaded: no ? ?HYPOTHYROIDISM ?Thyroid control status:controlled ?Satisfied with current treatment? yes ?Medication side effects: no ?Medication compliance: excellent compliance ?Etiology of hypothyroidism:  ?Recent dose adjustment:no ?Fatigue: no ?Cold intolerance: no ?Heat intolerance: no ?Weight gain: no ?Weight loss: no ?Constipation: no ?Diarrhea/loose stools: no ?Palpitations: no ?Lower extremity edema: no ?Anxiety/depressed mood: no ? ?CHRONIC KIDNEY DISEASE ?CKD status: controlled ?Medications renally dose: no ?Previous renal evaluation: no ?Pneumovax:  Up to Date ?Influenza Vaccine:  Up to Date ? ?DIABETES ?Hypoglycemic episodes:no ?Polydipsia/polyuria: no ?Visual disturbance: no ?Chest pain: no ?Paresthesias: no ?Glucose Monitoring: no ? Accucheck frequency: Daily ? Fasting glucose: 90-100 in the morning  ? Post prandial: ? Evening: ? Before meals: ?Taking Insulin?: no ? Long  acting insulin: ? Short acting insulin: ?Blood Pressure Monitoring: daily ?Retinal Examination: Not up to date ?Foot Exam: Up to Date ?Diabetic Education: Not Completed ?Pneumovax: Up to Date ?Influenza: Up to Date ?Aspirin: yes ? ? ?Depression Screen done today and results listed below:  ? ?  05/05/2021  ?  8:23 AM 12/01/2020  ?  9:13 AM 11/20/2020  ?  8:51 AM 11/04/2020  ?  8:27 AM 11/04/2020  ?  8:26 AM  ?Depression screen PHQ 2/9  ?Decreased Interest 0 0 0 0 0  ?Down, Depressed, Hopeless 0 0 0 0 0  ?PHQ - 2 Score 0 0 0 0 0  ?Altered sleeping 0 0 0 0   ?Tired, decreased energy 0 0 0 0   ?Change in appetite 0 0 0 0   ?Feeling bad or failure about yourself  0 0 0 0   ?Trouble concentrating 0 0 0 0   ?Moving slowly or fidgety/restless 0 0 0 0   ?Suicidal thoughts 0 0 0 0   ?PHQ-9 Score 0 0 0 0   ?Difficult doing work/chores Not difficult at all Not difficult at all Not difficult at all    ? ? ?The patient does not have a history of falls. I did complete a risk assessment for falls. A plan of care for falls was documented. ? ? ?Past Medical History:  ?Past Medical History:  ?Diagnosis Date  ? Chronic kidney disease   ? Diabetes mellitus without complication (HCC)   ? Hyperlipidemia   ? Hypertension   ? Hypothyroidism   ? Osteopenia   ? Osteoporosis   ? Thyroid disease   ? ? ?Surgical History:  ?Past Surgical History:  ?Procedure Laterality Date  ? CESAREAN SECTION    ? COLONOSCOPY WITH PROPOFOL  N/A 05/03/2017  ? Procedure: COLONOSCOPY WITH PROPOFOL;  Surgeon: [name], MD;  Location: [facility] ENDOSCOPY;  Service: Endoscopy;  Laterality: N/A;  ? VENTRAL HERNIA REPAIR N/A 10/31/2017  ? Procedure: LAPAROSCOPIC VENTRAL HERNIA;  Surgeon: Carolan Shiver, MD;  Location: [facility] ORS;  Service: General;  Laterality: N/A;  ? VENTRAL HERNIA REPAIR N/A 10/31/2017  ? Procedure: HERNIA REPAIR VENTRAL ADULT;  Surgeon: Carolan Shiver, MD;  Location: [facility] ORS;  Service: General;  Laterality: N/A;  ? ? ?Medications:   ?Current Outpatient Medications on File Prior to Visit  ?Medication Sig  ? aspirin EC 81 MG tablet Take 81 mg by mouth at bedtime.  ? calcium carbonate (OS-CAL) 600 MG TABS tablet Take 600 mg by mouth at bedtime.  ? Cholecalciferol (VITAMIN D3) 5000 units CAPS Take 1 capsule by mouth daily.  ? ferrous gluconate (FERGON) 225 (27 Fe) MG tablet Take 240 mg by mouth daily.  ? ONE TOUCH ULTRA TEST test strip CHECK BLOOD SUGAR ONCE DAILY  ? raloxifene (EVISTA) 60 MG tablet TAKE 1 TABLET BY MOUTH ONCE DAILY.  ? vitamin B-12 (CYANOCOBALAMIN) 1000 MCG tablet Take 1,000 mcg by mouth at bedtime.  ? ?No current facility-administered medications on file prior to visit.  ? ? ?Allergies:  ?No Known Allergies ? ?Social History:  ?Social History  ? ?Socioeconomic History  ? Marital status: Married  ?  Spouse name: Not on file  ? Number of children: Not on file  ? Years of education: Not on file  ? Highest education level: Not on file  ?Occupational History  ? Occupation: retired  ?Tobacco Use  ? Smoking status: Never  ? Smokeless tobacco: Never  ?Vaping Use  ? Vaping Use: Never used  ?Substance and Sexual Activity  ? Alcohol use: No  ?  Alcohol/week: 0.0 standard drinks  ? Drug use: No  ? Sexual activity: Yes  ?Other Topics Concern  ? Not on file  ?Social History Narrative  ? Not on file  ? ?Social Determinants of Health  ? ?Financial Resource Strain: Low Risk   ? Difficulty of Paying Living Expenses: Not hard at all  ?Food Insecurity: No Food Insecurity  ? Worried About Programme researcher, broadcasting/film/video in the Last Year: Never true  ? Ran Out of Food in the Last Year: Never true  ?Transportation Needs: No Transportation Needs  ? Lack of Transportation (Medical): No  ? Lack of Transportation (Non-Medical): No  ?Physical Activity: Inactive  ? Days of Exercise per Week: 0 days  ? Minutes of Exercise per Session: 0 min  ?Stress: No Stress Concern Present  ? Feeling of Stress : Not at all  ?Social Connections: Socially Integrated  ? Frequency of  Communication with Friends and Family: Not on file  ? Frequency of Social Gatherings with Friends and Family: More than three times a week  ? Attends Religious Services: More than 4 times per year  ? Active Member of Clubs or Organizations: Yes  ? Attends Banker Meetings: More than 4 times per year  ? Marital Status: Married  ?Intimate Partner Violence: Not At Risk  ? Fear of Current or Ex-Partner: No  ? Emotionally Abused: No  ? Physically Abused: No  ? Sexually Abused: No  ? ?Social History  ? ?Tobacco Use  ?Smoking Status Never  ?Smokeless Tobacco Never  ? ?Social History  ? ?Substance and Sexual Activity  ?Alcohol Use No  ? Alcohol/week: 0.0 standard drinks  ? ? ?Family History:  ?Family History  ?  Problem Relation Age of Onset  ? Cancer Mother 74  ?     melanoma  ? Diabetes Mother   ? Thyroid disease Mother   ? Cancer Father 36  ?     prostate to bone  ? Diabetes Daughter   ? Heart disease Maternal Grandfather   ? Stroke Paternal Grandmother   ? Stroke Paternal Grandfather   ? Breast cancer Neg Hx   ? ? ?Past medical history, surgical history, medications, allergies, family history and social history reviewed with patient today and changes made to appropriate areas of the chart.  ? ?Review of Systems  ?HENT:    ?     Denies vision changes.  ?Eyes:  Negative for blurred vision and double vision.  ?Respiratory:  Negative for shortness of breath.   ?Cardiovascular:  Negative for chest pain, palpitations and leg swelling.  ?Neurological:  Negative for dizziness, tingling and headaches.  ?Endo/Heme/Allergies:  Negative for polydipsia.  ?     Denies Polyuria  ?All other ROS negative except what is listed above and in the HPI.  ? ?   ?Objective:  ?  ?BP 121/65   Pulse 75   Temp 97.8 ?F (36.6 ?C) (Oral)   Ht 5\' 2"  (1.575 m)   Wt 150 lb 6.4 oz (68.2 kg)   LMP  (LMP Unknown)   SpO2 94%   BMI 27.51 kg/m?   ?Wt Readings from Last 3 Encounters:  ?05/05/21 150 lb 6.4 oz (68.2 kg)  ?03/13/21 151 lb 3.2 oz  (68.6 kg)  ?11/20/20 159 lb 3.2 oz (72.2 kg)  ?  ?Physical Exam ?Vitals and nursing note reviewed.  ?Constitutional:   ?   General: She is awake. She is not in acute distress. ?   Appearance: She is well-developed. She

## 2021-05-05 ENCOUNTER — Encounter: Payer: Self-pay | Admitting: Nurse Practitioner

## 2021-05-05 ENCOUNTER — Ambulatory Visit (INDEPENDENT_AMBULATORY_CARE_PROVIDER_SITE_OTHER): Payer: Medicare HMO | Admitting: Nurse Practitioner

## 2021-05-05 VITALS — BP 121/65 | HR 75 | Temp 97.8°F | Ht 62.0 in | Wt 150.4 lb

## 2021-05-05 DIAGNOSIS — Z0000 Encounter for general adult medical examination without abnormal findings: Secondary | ICD-10-CM | POA: Diagnosis not present

## 2021-05-05 DIAGNOSIS — E785 Hyperlipidemia, unspecified: Secondary | ICD-10-CM

## 2021-05-05 DIAGNOSIS — E039 Hypothyroidism, unspecified: Secondary | ICD-10-CM

## 2021-05-05 DIAGNOSIS — E538 Deficiency of other specified B group vitamins: Secondary | ICD-10-CM

## 2021-05-05 DIAGNOSIS — E1159 Type 2 diabetes mellitus with other circulatory complications: Secondary | ICD-10-CM | POA: Diagnosis not present

## 2021-05-05 DIAGNOSIS — R809 Proteinuria, unspecified: Secondary | ICD-10-CM

## 2021-05-05 DIAGNOSIS — E1129 Type 2 diabetes mellitus with other diabetic kidney complication: Secondary | ICD-10-CM | POA: Diagnosis not present

## 2021-05-05 DIAGNOSIS — N1831 Chronic kidney disease, stage 3a: Secondary | ICD-10-CM | POA: Diagnosis not present

## 2021-05-05 DIAGNOSIS — I700 Atherosclerosis of aorta: Secondary | ICD-10-CM

## 2021-05-05 DIAGNOSIS — E1169 Type 2 diabetes mellitus with other specified complication: Secondary | ICD-10-CM

## 2021-05-05 DIAGNOSIS — R8290 Unspecified abnormal findings in urine: Secondary | ICD-10-CM | POA: Diagnosis not present

## 2021-05-05 DIAGNOSIS — I152 Hypertension secondary to endocrine disorders: Secondary | ICD-10-CM

## 2021-05-05 LAB — URINALYSIS, ROUTINE W REFLEX MICROSCOPIC
Bilirubin, UA: NEGATIVE
Glucose, UA: NEGATIVE
Ketones, UA: NEGATIVE
Nitrite, UA: NEGATIVE
Protein,UA: NEGATIVE
Specific Gravity, UA: 1.015 (ref 1.005–1.030)
Urobilinogen, Ur: 0.2 mg/dL (ref 0.2–1.0)
pH, UA: 5.5 (ref 5.0–7.5)

## 2021-05-05 LAB — MICROSCOPIC EXAMINATION
Bacteria, UA: NONE SEEN
Epithelial Cells (non renal): NONE SEEN /hpf (ref 0–10)

## 2021-05-05 MED ORDER — VICTOZA 18 MG/3ML ~~LOC~~ SOPN: PEN_INJECTOR | SUBCUTANEOUS | 1 refills | Status: DC

## 2021-05-05 MED ORDER — METFORMIN HCL ER 500 MG PO TB24: ORAL_TABLET | ORAL | 1 refills | Status: DC

## 2021-05-05 MED ORDER — GLYBURIDE 5 MG PO TABS: 5.0000 mg | ORAL_TABLET | Freq: Every day | ORAL | 1 refills | Status: DC

## 2021-05-05 MED ORDER — PIOGLITAZONE HCL 30 MG PO TABS: ORAL_TABLET | ORAL | 1 refills | Status: DC

## 2021-05-05 MED ORDER — LEVOTHYROXINE SODIUM 75 MCG PO TABS: ORAL_TABLET | ORAL | 1 refills | Status: DC

## 2021-05-05 MED ORDER — PRAVASTATIN SODIUM 40 MG PO TABS: ORAL_TABLET | ORAL | 1 refills | Status: DC

## 2021-05-05 MED ORDER — GABAPENTIN 300 MG PO CAPS: 300.0000 mg | ORAL_CAPSULE | Freq: Three times a day (TID) | ORAL | 1 refills | Status: DC

## 2021-05-05 MED ORDER — QUINAPRIL HCL 10 MG PO TABS: 10.0000 mg | ORAL_TABLET | Freq: Every day | ORAL | 1 refills | Status: DC

## 2021-05-05 NOTE — Addendum Note (Signed)
Addended by: Jon Billings on: 05/05/2021 01:39 PM ? ? Modules accepted: Orders ? ?

## 2021-05-05 NOTE — Assessment & Plan Note (Signed)
Labs ordered today.  Will make recommendations based on lab results. ?

## 2021-05-05 NOTE — Assessment & Plan Note (Signed)
Noted on CT imaging 07/01/2017.  Continue statin and ASA daily for prevention + good diabetes control.  Continue with Pravastatin. ?

## 2021-05-05 NOTE — Assessment & Plan Note (Signed)
Chronic.  Controlled.  Continue with current medication regimen.  Labs ordered today.  Return to clinic in 6 months for reevaluation.  Call sooner if concerns arise.  ? ?

## 2021-05-05 NOTE — Assessment & Plan Note (Signed)
Labs ordered today. Will make recommendations based on lab results. Continue with current dose of Levothyroxine. ?

## 2021-05-05 NOTE — Assessment & Plan Note (Addendum)
Chronic.  Controlled.  Continue with current medication regimen.  Continue with Quinapril for Kidney protection.  Labs ordered today.  Return to clinic in 6 months for reevaluation.  Call sooner if concerns arise.  ? ?

## 2021-05-05 NOTE — Assessment & Plan Note (Signed)
Chronic.  Controlled.  Continue with current medication regimen on Victoza, Metformin, Actos, and Glyburide. Would like to stop glyberide and Actos but patient is hesitant.  Sugars running 90-100 concern for hypoglycemia.  Labs ordered today.  Return to clinic in 6 months for reevaluation.  Call sooner if concerns arise.  ? ?

## 2021-05-05 NOTE — Assessment & Plan Note (Signed)
Chronic.  Controlled.  Continue with current medication regimen on Pravastatin 40mg.  Labs ordered today.  Return to clinic in 6 months for reevaluation.  Call sooner if concerns arise.  ° °

## 2021-05-06 LAB — COMPREHENSIVE METABOLIC PANEL
ALT: 10 IU/L (ref 0–32)
AST: 18 IU/L (ref 0–40)
Albumin/Globulin Ratio: 1.7 (ref 1.2–2.2)
Albumin: 4.1 g/dL (ref 3.7–4.7)
Alkaline Phosphatase: 62 IU/L (ref 44–121)
BUN/Creatinine Ratio: 22 (ref 12–28)
BUN: 21 mg/dL (ref 8–27)
Bilirubin Total: 0.3 mg/dL (ref 0.0–1.2)
CO2: 22 mmol/L (ref 20–29)
Calcium: 9.2 mg/dL (ref 8.7–10.3)
Chloride: 103 mmol/L (ref 96–106)
Creatinine, Ser: 0.94 mg/dL (ref 0.57–1.00)
Globulin, Total: 2.4 g/dL (ref 1.5–4.5)
Glucose: 86 mg/dL (ref 70–99)
Potassium: 4.6 mmol/L (ref 3.5–5.2)
Sodium: 142 mmol/L (ref 134–144)
Total Protein: 6.5 g/dL (ref 6.0–8.5)
eGFR: 64 mL/min/{1.73_m2} (ref >59)

## 2021-05-06 LAB — HEMOGLOBIN A1C
Est. average glucose Bld gHb Est-mCnc: 140 mg/dL
Hgb A1c MFr Bld: 6.5 % — ABNORMAL HIGH (ref 4.8–5.6)

## 2021-05-06 LAB — CBC WITH DIFFERENTIAL/PLATELET
Basophils Absolute: 0 10*3/uL (ref 0.0–0.2)
Basos: 1 %
EOS (ABSOLUTE): 0.1 10*3/uL (ref 0.0–0.4)
Eos: 1 %
Hematocrit: 33.9 % — ABNORMAL LOW (ref 34.0–46.6)
Hemoglobin: 11.2 g/dL (ref 11.1–15.9)
Immature Grans (Abs): 0 10*3/uL (ref 0.0–0.1)
Immature Granulocytes: 1 %
Lymphocytes Absolute: 1.2 10*3/uL (ref 0.7–3.1)
Lymphs: 21 %
MCH: 33 pg (ref 26.6–33.0)
MCHC: 33 g/dL (ref 31.5–35.7)
MCV: 100 fL — ABNORMAL HIGH (ref 79–97)
Monocytes Absolute: 0.6 10*3/uL (ref 0.1–0.9)
Monocytes: 10 %
Neutrophils Absolute: 3.9 10*3/uL (ref 1.4–7.0)
Neutrophils: 66 %
Platelets: 275 10*3/uL (ref 150–450)
RBC: 3.39 x10E6/uL — ABNORMAL LOW (ref 3.77–5.28)
RDW: 12.4 % (ref 11.7–15.4)
WBC: 5.8 10*3/uL (ref 3.4–10.8)

## 2021-05-06 LAB — LIPID PANEL
Chol/HDL Ratio: 2.3 ratio (ref 0.0–4.4)
Cholesterol, Total: 127 mg/dL (ref 100–199)
HDL: 55 mg/dL (ref >39)
LDL Chol Calc (NIH): 59 mg/dL (ref 0–99)
Triglycerides: 64 mg/dL (ref 0–149)
VLDL Cholesterol Cal: 13 mg/dL (ref 5–40)

## 2021-05-06 LAB — T4, FREE: Free T4: 1.89 ng/dL — ABNORMAL HIGH (ref 0.82–1.77)

## 2021-05-06 LAB — VITAMIN B12: Vitamin B-12: >2000 pg/mL — ABNORMAL HIGH (ref 232–1245)

## 2021-05-06 LAB — TSH: TSH: 1.01 u[IU]/mL (ref 0.450–4.500)

## 2021-05-06 NOTE — Progress Notes (Signed)
Hi Emma Berry.  It was nice to see you yesterday.  Overall your lab work looks good.  A1c remains stable at 6.5.  Continue with your current medication regimen.  Please let me know if you have any questions.  I will see you at our next visit.

## 2021-05-07 LAB — URINE CULTURE

## 2021-05-07 NOTE — Progress Notes (Signed)
Hi Ms. Hazyl. Your urine did not grow bacteria and you do not need any antibiotics.

## 2021-05-15 ENCOUNTER — Ambulatory Visit: Payer: Self-pay

## 2021-05-15 NOTE — Telephone Encounter (Signed)
Reason for CRM: Melissa with [facility] has called to request an alternative prescription to quinapril (ACCUPRIL) 10 MG tablet [[id number]]  ? ?Melissa does not anticipate the manufacturer of this drug to continue with production  ? ?Please contact further with an alternative prescription when possible  ? ? ? ?Chief Complaint: Pharmacy having difficulty obtaining medication, asking for alternative. ?Symptoms: n/a ?Frequency: n/a ?Pertinent Negatives: Patient denies n/a ?Disposition: [] ED /[] Urgent Care (no appt availability in office) / [] Appointment(In office/virtual)/ []  [place] [facility]/ [] Home Care/ [] Refused Recommended Disposition /[]  Mobile Bus/ [x]  Follow-up with PCP ?Additional Notes: Please advise pharmacy.  ?Answer Assessment - Initial Assessment Questions ?1. NAME of MEDICATION: "What medicine are you calling about?" ?    Quinapril ?2. QUESTION: "What is your question?" (e.g., double dose of medicine, side effect) ?    Pharmacy having difficulty obtaining medication, asking for alternative ?3. PRESCRIBING HCP: "Who prescribed it?" Reason: if prescribed by specialist, call should be referred to that group. ?    [place] ?4. SYMPTOMS: "Do you have any symptoms?" ?    N/a ?5. SEVERITY: If symptoms are present, ask "Are they mild, moderate or severe?" ?    N/a ?6. PREGNANCY:  "Is there any chance that you are pregnant?" "When was your last menstrual period?" ?    N/a ? ?Protocols used: Medication Question Call-A-AH ? ?

## 2021-05-18 MED ORDER — LISINOPRIL 10 MG PO TABS: 10.0000 mg | ORAL_TABLET | Freq: Every day | ORAL | 1 refills | Status: DC

## 2021-05-18 NOTE — Telephone Encounter (Signed)
Pharmacy notified of medication change

## 2021-05-18 NOTE — Telephone Encounter (Signed)
Please let the pharmacy know that the medication was changed to Lisinopril. ?

## 2021-05-18 NOTE — Addendum Note (Signed)
Addended by: Larae Grooms on: 05/18/2021 09:01 AM ? ? Modules accepted: Orders ? ?

## 2021-09-10 ENCOUNTER — Other Ambulatory Visit: Payer: Self-pay

## 2021-09-10 NOTE — Telephone Encounter (Signed)
Med refill request from [facility] for victoza, last seen on 05/05/2021. Filled on 05/05/2021 with 1 RF. Next scheduled appt for 11/04/2021. Please advise.

## 2021-09-11 MED ORDER — VICTOZA 18 MG/3ML ~~LOC~~ SOPN: PEN_INJECTOR | SUBCUTANEOUS | 1 refills | Status: DC

## 2021-10-05 DIAGNOSIS — H5203 Hypermetropia, bilateral: Secondary | ICD-10-CM | POA: Diagnosis not present

## 2021-10-05 LAB — HM DIABETES EYE EXAM

## 2021-11-03 NOTE — Progress Notes (Unsigned)
LMP  (LMP Unknown)    Subjective:    Patient ID: Emma Berry, female    DOB: 07-29-1950, 71 y.o.   MRN: [id number]  HPI: Emma Berry is a 71 y.o. female  No chief complaint on file.  HYPERTENSION / HYPERLIPIDEMIA Satisfied with current treatment? yes Duration of hypertension: years BP monitoring frequency: daily BP range: 120/70 BP medication side effects: no Past BP meds: quinapril Duration of hyperlipidemia: years Cholesterol medication side effects: no Cholesterol supplements: none Past cholesterol medications: pravastatin (pravachol) Medication compliance: excellent compliance Aspirin: no Recent stressors: no Recurrent headaches: no Visual changes: no Palpitations: no Dyspnea: no Chest pain: no Lower extremity edema: no Dizzy/lightheaded: no  HYPOTHYROIDISM Thyroid control status:controlled Satisfied with current treatment? yes Medication side effects: no Medication compliance: excellent compliance Etiology of hypothyroidism:  Recent dose adjustment:no Fatigue: no Cold intolerance: no Heat intolerance: no Weight gain: no Weight loss: no Constipation: no Diarrhea/loose stools: no Palpitations: no Lower extremity edema: no Anxiety/depressed mood: no  CHRONIC KIDNEY DISEASE CKD status: controlled Medications renally dose: no Previous renal evaluation: no Pneumovax:  Up to Date Influenza Vaccine:  Up to Date  DIABETES Hypoglycemic episodes:no Polydipsia/polyuria: no Visual disturbance: no Chest pain: no Paresthesias: no Glucose Monitoring: no  Accucheck frequency: Daily  Fasting glucose: 90-100  Post prandial:  Evening:  Before meals: Taking Insulin?: no  Long acting insulin:  Short acting insulin: Blood Pressure Monitoring: daily Retinal Examination: Up to Date Foot Exam: Up to Date Diabetic Education: Not Completed Pneumovax: Up to Date Influenza: Up to Date Aspirin: yes  HIP PAIN Patient states she has been having hip pain  that runs down to her right knee.  States this has been going on for about a week.  States it isn't excruciating but it has been bothering her.  She wants to prevent it from getting that bad.   She took Gabapentin in the past for the same pain and it improved her symptoms.    Relevant past medical, surgical, family and social history reviewed and updated as indicated. Interim medical history since our last visit reviewed. Allergies and medications reviewed and updated.  Review of Systems  Eyes:  Negative for visual disturbance.  Respiratory:  Negative for chest tightness and shortness of breath.   Cardiovascular:  Negative for chest pain, palpitations and leg swelling.  Endocrine: Negative for polydipsia and polyuria.  Neurological:  Negative for dizziness, light-headedness, numbness and headaches.    Per HPI unless specifically indicated above     Objective:    LMP  (LMP Unknown)   Wt Readings from Last 3 Encounters:  05/05/21 150 lb 6.4 oz (68.2 kg)  03/13/21 151 lb 3.2 oz (68.6 kg)  11/20/20 159 lb 3.2 oz (72.2 kg)    Physical Exam Vitals and nursing note reviewed.  Constitutional:      General: She is not in acute distress.    Appearance: Normal appearance. She is normal weight. She is not ill-appearing, toxic-appearing or diaphoretic.  HENT:     Head: Normocephalic.     Right Ear: External ear normal.     Left Ear: External ear normal.     Nose: Nose normal.     Mouth/Throat:     Mouth: Mucous membranes are moist.     Pharynx: Oropharynx is clear.  Eyes:     General:        Right eye: No discharge.        Left eye: No discharge.  Extraocular Movements: Extraocular movements intact.     Conjunctiva/sclera: Conjunctivae normal.     Pupils: Pupils are equal, round, and reactive to light.  Cardiovascular:     Rate and Rhythm: Normal rate and regular rhythm.     Heart sounds: No murmur heard. Pulmonary:     Effort: Pulmonary effort is normal. No respiratory  distress.     Breath sounds: Normal breath sounds. No wheezing or rales.  Musculoskeletal:     Cervical back: Normal range of motion and neck supple.  Skin:    General: Skin is warm and dry.     Capillary Refill: Capillary refill takes less than 2 seconds.  Neurological:     General: No focal deficit present.     Mental Status: She is alert and oriented to person, place, and time. Mental status is at baseline.  Psychiatric:        Mood and Affect: Mood normal.        Behavior: Behavior normal.        Thought Content: Thought content normal.        Judgment: Judgment normal.     Results for orders placed or performed in visit on 05/05/21  Microscopic Examination   Urine  Result Value Ref Range   WBC, UA 0-5 0 - 5 /hpf   RBC, Urine 0-2 0 - 2 /hpf   Epithelial Cells (non renal) None seen 0 - 10 /hpf   Mucus, UA Present (A) Not Estab.   Bacteria, UA None seen None seen/Few  Urine Culture   Specimen: Urine   UR  Result Value Ref Range   Urine Culture, Routine Final report    Organism ID, Bacteria Comment   CBC with Differential/Platelet  Result Value Ref Range   WBC 5.8 3.4 - 10.8 x10E3/uL   RBC 3.39 (L) 3.77 - 5.28 x10E6/uL   Hemoglobin 11.2 11.1 - 15.9 g/dL   Hematocrit 33.9 (L) 34.0 - 46.6 %   MCV 100 (H) 79 - 97 fL   MCH 33.0 26.6 - 33.0 pg   MCHC 33.0 31.5 - 35.7 g/dL   RDW 12.4 11.7 - 15.4 %   Platelets 275 150 - 450 x10E3/uL   Neutrophils 66 Not Estab. %   Lymphs 21 Not Estab. %   Monocytes 10 Not Estab. %   Eos 1 Not Estab. %   Basos 1 Not Estab. %   Neutrophils Absolute 3.9 1.4 - 7.0 x10E3/uL   Lymphocytes Absolute 1.2 0.7 - 3.1 x10E3/uL   Monocytes Absolute 0.6 0.1 - 0.9 x10E3/uL   EOS (ABSOLUTE) 0.1 0.0 - 0.4 x10E3/uL   Basophils Absolute 0.0 0.0 - 0.2 x10E3/uL   Immature Granulocytes 1 Not Estab. %   Immature Grans (Abs) 0.0 0.0 - 0.1 x10E3/uL   Hematology Comments: Note:   Comprehensive metabolic panel  Result Value Ref Range   Glucose 86 70 - 99  mg/dL   BUN 21 8 - 27 mg/dL   Creatinine, Ser 0.94 0.57 - 1.00 mg/dL   eGFR 64 >59 mL/min/1.73   BUN/Creatinine Ratio 22 12 - 28   Sodium 142 134 - 144 mmol/L   Potassium 4.6 3.5 - 5.2 mmol/L   Chloride 103 96 - 106 mmol/L   CO2 22 20 - 29 mmol/L   Calcium 9.2 8.7 - 10.3 mg/dL   Total Protein 6.5 6.0 - 8.5 g/dL   Albumin 4.1 3.7 - 4.7 g/dL   Globulin, Total 2.4 1.5 - 4.5 g/dL   Albumin/Globulin Ratio  1.7 1.2 - 2.2   Bilirubin Total 0.3 0.0 - 1.2 mg/dL   Alkaline Phosphatase 62 44 - 121 IU/L   AST 18 0 - 40 IU/L   ALT 10 0 - 32 IU/L  Lipid panel  Result Value Ref Range   Cholesterol, Total 127 100 - 199 mg/dL   Triglycerides 64 0 - 149 mg/dL   HDL 55 >39 mg/dL   VLDL Cholesterol Cal 13 5 - 40 mg/dL   LDL Chol Calc (NIH) 59 0 - 99 mg/dL   Chol/HDL Ratio 2.3 0.0 - 4.4 ratio  TSH  Result Value Ref Range   TSH 1.010 0.450 - 4.500 uIU/mL  Urinalysis, Routine w reflex microscopic  Result Value Ref Range   Specific Gravity, UA 1.015 1.005 - 1.030   pH, UA 5.5 5.0 - 7.5   Color, UA Yellow Yellow   Appearance Ur Clear Clear   Leukocytes,UA 1+ (A) Negative   Protein,UA Negative Negative/Trace   Glucose, UA Negative Negative   Ketones, UA Negative Negative   RBC, UA Trace (A) Negative   Bilirubin, UA Negative Negative   Urobilinogen, Ur 0.2 0.2 - 1.0 mg/dL   Nitrite, UA Negative Negative   Microscopic Examination See below:   HgB A1c  Result Value Ref Range   Hgb A1c MFr Bld 6.5 (H) 4.8 - 5.6 %   Est. average glucose Bld gHb Est-mCnc 140 mg/dL  B12  Result Value Ref Range   Vitamin B-12 >2000 (H) 232 - 1245 pg/mL  T4, free  Result Value Ref Range   Free T4 1.89 (H) 0.82 - 1.77 ng/dL      Assessment & Plan:   Problem List Items Addressed This Visit   None    Follow up plan: No follow-ups on file.

## 2021-11-04 ENCOUNTER — Ambulatory Visit (INDEPENDENT_AMBULATORY_CARE_PROVIDER_SITE_OTHER): Payer: Medicare HMO | Admitting: Nurse Practitioner

## 2021-11-04 ENCOUNTER — Encounter: Payer: Self-pay | Admitting: Nurse Practitioner

## 2021-11-04 VITALS — BP 123/63 | HR 64 | Temp 98.4°F | Wt 149.6 lb

## 2021-11-04 DIAGNOSIS — E1129 Type 2 diabetes mellitus with other diabetic kidney complication: Secondary | ICD-10-CM

## 2021-11-04 DIAGNOSIS — M8580 Other specified disorders of bone density and structure, unspecified site: Secondary | ICD-10-CM | POA: Diagnosis not present

## 2021-11-04 DIAGNOSIS — E538 Deficiency of other specified B group vitamins: Secondary | ICD-10-CM | POA: Diagnosis not present

## 2021-11-04 DIAGNOSIS — Z23 Encounter for immunization: Secondary | ICD-10-CM | POA: Diagnosis not present

## 2021-11-04 DIAGNOSIS — E1159 Type 2 diabetes mellitus with other circulatory complications: Secondary | ICD-10-CM

## 2021-11-04 DIAGNOSIS — R809 Proteinuria, unspecified: Secondary | ICD-10-CM

## 2021-11-04 DIAGNOSIS — E785 Hyperlipidemia, unspecified: Secondary | ICD-10-CM | POA: Diagnosis not present

## 2021-11-04 DIAGNOSIS — I152 Hypertension secondary to endocrine disorders: Secondary | ICD-10-CM | POA: Diagnosis not present

## 2021-11-04 DIAGNOSIS — N1831 Chronic kidney disease, stage 3a: Secondary | ICD-10-CM | POA: Diagnosis not present

## 2021-11-04 DIAGNOSIS — E1169 Type 2 diabetes mellitus with other specified complication: Secondary | ICD-10-CM | POA: Diagnosis not present

## 2021-11-04 DIAGNOSIS — E039 Hypothyroidism, unspecified: Secondary | ICD-10-CM | POA: Diagnosis not present

## 2021-11-04 DIAGNOSIS — I700 Atherosclerosis of aorta: Secondary | ICD-10-CM

## 2021-11-04 DIAGNOSIS — Z1231 Encounter for screening mammogram for malignant neoplasm of breast: Secondary | ICD-10-CM

## 2021-11-04 LAB — MICROALBUMIN, URINE WAIVED
Creatinine, Urine Waived: 50 mg/dL (ref 10–300)
Microalb, Ur Waived: 10 mg/L (ref 0–19)

## 2021-11-04 MED ORDER — GABAPENTIN 300 MG PO CAPS: 300.0000 mg | ORAL_CAPSULE | Freq: Three times a day (TID) | ORAL | 1 refills | Status: DC

## 2021-11-04 MED ORDER — PRAVASTATIN SODIUM 40 MG PO TABS: ORAL_TABLET | ORAL | 1 refills | Status: DC

## 2021-11-04 MED ORDER — VICTOZA 18 MG/3ML ~~LOC~~ SOPN: PEN_INJECTOR | SUBCUTANEOUS | 1 refills | Status: DC

## 2021-11-04 MED ORDER — RALOXIFENE HCL 60 MG PO TABS: 60.0000 mg | ORAL_TABLET | Freq: Every day | ORAL | 2 refills | Status: DC

## 2021-11-04 MED ORDER — LISINOPRIL 10 MG PO TABS: 10.0000 mg | ORAL_TABLET | Freq: Every day | ORAL | 1 refills | Status: DC

## 2021-11-04 MED ORDER — METFORMIN HCL ER 500 MG PO TB24: ORAL_TABLET | ORAL | 1 refills | Status: DC

## 2021-11-04 MED ORDER — PIOGLITAZONE HCL 30 MG PO TABS: ORAL_TABLET | ORAL | 1 refills | Status: DC

## 2021-11-04 MED ORDER — GLYBURIDE 5 MG PO TABS: 5.0000 mg | ORAL_TABLET | Freq: Every day | ORAL | 1 refills | Status: DC

## 2021-11-04 MED ORDER — LEVOTHYROXINE SODIUM 75 MCG PO TABS: ORAL_TABLET | ORAL | 1 refills | Status: DC

## 2021-11-04 NOTE — Assessment & Plan Note (Signed)
Chronic. Controlled. No changes to current medications at this time.

## 2021-11-04 NOTE — Assessment & Plan Note (Signed)
Chronic. No changes to current plan.

## 2021-11-04 NOTE — Assessment & Plan Note (Signed)
Urine microalbumin and A1C obtained this visit. Will f/u with pt on results.

## 2021-11-04 NOTE — Assessment & Plan Note (Signed)
Last Dexa scan 2021. Next Dexa scan scheduled this visit. Reports no falls.

## 2021-11-04 NOTE — Assessment & Plan Note (Signed)
Denies fatigue.  B12 level drawn this visit. Will f/u with results once processed.

## 2021-11-04 NOTE — Addendum Note (Signed)
Addended by: Jon Billings on: 11/04/2021 12:20 PM   Modules accepted: Orders

## 2021-11-04 NOTE — Assessment & Plan Note (Signed)
Chronic. Stable. Checking TSH, T4 this visit. Attempting to change synthroid to generic levothyroxine. Will f/u with labs as resulted.

## 2021-11-04 NOTE — Assessment & Plan Note (Signed)
Mammogram scheduled this visit.

## 2021-11-04 NOTE — Assessment & Plan Note (Signed)
Chronic. Urine microalbumin and CMP obtained this visit. Will f/u with results.

## 2021-11-04 NOTE — Assessment & Plan Note (Signed)
Chronic. Still on prevastatin. Lipid panel obtained this visit. Will f/u with pt on results.

## 2021-11-05 LAB — VITAMIN B12: Vitamin B-12: 1973 pg/mL — ABNORMAL HIGH (ref 232–1245)

## 2021-11-05 LAB — COMPREHENSIVE METABOLIC PANEL
ALT: 7 IU/L (ref 0–32)
AST: 15 IU/L (ref 0–40)
Albumin/Globulin Ratio: 1.6 (ref 1.2–2.2)
Albumin: 4 g/dL (ref 3.8–4.8)
Alkaline Phosphatase: 63 IU/L (ref 44–121)
BUN/Creatinine Ratio: 22 (ref 12–28)
BUN: 24 mg/dL (ref 8–27)
Bilirubin Total: 0.3 mg/dL (ref 0.0–1.2)
CO2: 23 mmol/L (ref 20–29)
Calcium: 9.5 mg/dL (ref 8.7–10.3)
Chloride: 103 mmol/L (ref 96–106)
Creatinine, Ser: 1.08 mg/dL — ABNORMAL HIGH (ref 0.57–1.00)
Globulin, Total: 2.5 g/dL (ref 1.5–4.5)
Glucose: 81 mg/dL (ref 70–99)
Potassium: 4.7 mmol/L (ref 3.5–5.2)
Sodium: 138 mmol/L (ref 134–144)
Total Protein: 6.5 g/dL (ref 6.0–8.5)
eGFR: 54 mL/min/{1.73_m2} — ABNORMAL LOW (ref >59)

## 2021-11-05 LAB — T4, FREE: Free T4: 1.62 ng/dL (ref 0.82–1.77)

## 2021-11-05 LAB — LIPID PANEL
Chol/HDL Ratio: 2.3 ratio (ref 0.0–4.4)
Cholesterol, Total: 122 mg/dL (ref 100–199)
HDL: 52 mg/dL (ref >39)
LDL Chol Calc (NIH): 57 mg/dL (ref 0–99)
Triglycerides: 60 mg/dL (ref 0–149)
VLDL Cholesterol Cal: 13 mg/dL (ref 5–40)

## 2021-11-05 LAB — TSH: TSH: 1.29 u[IU]/mL (ref 0.450–4.500)

## 2021-11-05 LAB — HEMOGLOBIN A1C
Est. average glucose Bld gHb Est-mCnc: 126 mg/dL
Hgb A1c MFr Bld: 6 % — ABNORMAL HIGH (ref 4.8–5.6)

## 2021-11-06 NOTE — Progress Notes (Signed)
Hi Ms. Jenesa.  It was nice to see you this week.  Your lab work looks good.  No concerns at this time. Continue with your current medication regimen.  Follow up as discussed.  Please let me know if you have any questions.

## 2021-11-09 ENCOUNTER — Telehealth: Payer: Self-pay

## 2021-11-09 NOTE — Telephone Encounter (Signed)
Called patient to inform her of her bone density scan for Tuesday 01/19/2022 at 10:20 AM. Left detailed voicemail for patient.

## 2022-01-19 ENCOUNTER — Ambulatory Visit
Admission: RE | Admit: 2022-01-19 | Discharge: 2022-01-19 | Disposition: A | Discharge status: Home / Self Care | Payer: Medicare HMO | Source: Ambulatory Visit | Attending: Nurse Practitioner | Admitting: Nurse Practitioner

## 2022-01-19 DIAGNOSIS — Z1231 Encounter for screening mammogram for malignant neoplasm of breast: Secondary | ICD-10-CM

## 2022-01-19 DIAGNOSIS — E119 Type 2 diabetes mellitus without complications: Secondary | ICD-10-CM | POA: Diagnosis not present

## 2022-01-19 DIAGNOSIS — M8589 Other specified disorders of bone density and structure, multiple sites: Secondary | ICD-10-CM | POA: Insufficient documentation

## 2022-01-19 DIAGNOSIS — M8580 Other specified disorders of bone density and structure, unspecified site: Secondary | ICD-10-CM

## 2022-01-19 DIAGNOSIS — Z13820 Encounter for screening for osteoporosis: Secondary | ICD-10-CM | POA: Insufficient documentation

## 2022-01-19 DIAGNOSIS — E039 Hypothyroidism, unspecified: Secondary | ICD-10-CM | POA: Insufficient documentation

## 2022-01-19 DIAGNOSIS — Z780 Asymptomatic menopausal state: Secondary | ICD-10-CM | POA: Diagnosis not present

## 2022-01-19 NOTE — Progress Notes (Signed)
Please let patient know her Mammogram did not show any evidence of a malignancy.  The recommendation is to repeat the Mammogram in 1 year.  

## 2022-01-19 NOTE — Progress Notes (Signed)
Please let patient know that her bone density scan does show that she has osteopenia.  I recommend she take calcium 1200mg  with vitamin D daily.  I also recommend that she increase weight bearing exercises such as walking.

## 2022-03-16 ENCOUNTER — Other Ambulatory Visit: Payer: Self-pay | Admitting: Nurse Practitioner

## 2022-03-16 NOTE — Telephone Encounter (Signed)
Requested Prescriptions  Pending Prescriptions Disp Refills   liraglutide (VICTOZA) 18 MG/3ML SOPN [Pharmacy Med Name: Victoza 3-Pak 0.6 mg/0.1 mL (18 mg/3 mL) subcutaneous pen injector] 9 mL 0    Sig: INJECT 1.'8MG'$  INTO THE SKIN ONCE DAILY     Endocrinology:  Diabetes - GLP-1 Receptor Agonists Passed - 03/16/2022  9:05 AM      Passed - HBA1C is between 0 and 7.9 and within 180 days    Hemoglobin A1C  Date Value Ref Range Status  11/24/2015 7.2%  Final   HB A1C (BAYER DCA - WAIVED)  Date Value Ref Range Status  12/26/2019 6.2 <7.0 % Final    Comment:                                          Diabetic Adult            <7.0                                       Healthy Adult        4.3 - 5.7                                                           (DCCT/NGSP) American Diabetes Association's Summary of Glycemic Recommendations for Adults with Diabetes: Hemoglobin A1c <7.0%. More stringent glycemic goals (A1c <6.0%) may further reduce complications at the cost of increased risk of hypoglycemia.    Hgb A1c MFr Bld  Date Value Ref Range Status  11/04/2021 6.0 (H) 4.8 - 5.6 % Final    Comment:             Prediabetes: 5.7 - 6.4          Diabetes: >6.4          Glycemic control for adults with diabetes: <7.0          Passed - Valid encounter within last 6 months    Recent Outpatient Visits           4 months ago Hypertension associated with diabetes [facility])   [place] Jon Billings, NP   10 months ago Annual physical exam   [place] Jon Billings, NP   1 year ago Acute upper respiratory infection   [place] Jon Billings, NP   1 year ago Acute pain of right knee   [place] Jon Billings, NP   1 year ago Hypertension associated with diabetes [facility])   [place] Jon Billings, NP       Future Appointments             In 1  month Jon Billings, NP [place], [facility]

## 2022-03-24 ENCOUNTER — Telehealth: Payer: Self-pay | Admitting: Nurse Practitioner

## 2022-03-24 NOTE — Telephone Encounter (Signed)
Contacted ISABELLAH FERREBEE to schedule their annual wellness visit. Appointment made for 04/20/2022.  Sherol Dade; Care Guide [facility] [place] [facility] Direct Dial: [phone]

## 2022-03-24 NOTE — Telephone Encounter (Signed)
Copied from [place] [id number]. Topic: Medicare AWV >> Mar 24, 2022 10:31 AM Devoria Glassing wrote: Reason for CRM: Called patient to schedule Medicare Annual Wellness Visit (AWV). Left message for patient to call back and schedule Medicare Annual Wellness Visit (AWV).  Last date of AWV: 12/29/20  Please schedule an appointment at any time with Kirke Shaggy, LPN .  If any questions, please contact me.  Thank you ,  Sherol Dade; [place] Direct Dial: [phone]

## 2022-04-20 ENCOUNTER — Ambulatory Visit (INDEPENDENT_AMBULATORY_CARE_PROVIDER_SITE_OTHER): Payer: Medicare HMO

## 2022-04-20 VITALS — Ht 62.0 in | Wt 149.0 lb

## 2022-04-20 DIAGNOSIS — Z0000 Encounter for general adult medical examination without abnormal findings: Secondary | ICD-10-CM | POA: Diagnosis not present

## 2022-04-20 NOTE — Progress Notes (Signed)
I connected with  Emma Berry on 04/20/22 by a audio enabled telemedicine application and verified that I am speaking with the correct person using two identifiers.  Patient Location: Home  Provider Location: Office/Clinic  I discussed the limitations of evaluation and management by telemedicine. The patient expressed understanding and agreed to proceed.  Subjective:   Emma Berry is a 72 y.o. female who presents for Medicare Annual (Subsequent) preventive examination.  Review of Systems     Cardiac Risk Factors include: advanced age (>24men, >72 women);diabetes mellitus;hypertension     Objective:    There were no vitals filed for this visit. There is no height or weight on file to calculate BMI.     04/20/2022   11:35 AM 12/01/2020    9:05 AM 11/30/2019    9:01 AM 11/29/2018    9:08 AM 11/24/2017    1:07 PM 10/31/2017    8:54 AM 10/04/2017    1:30 PM  Advanced Directives  Does Patient Have a Medical Advance Directive? No No No Yes No No No  Type of Advance Directive    Living will;Healthcare Power of Attorney     Copy of Healthcare Power of Attorney in Chart?    Yes - validated most recent copy scanned in chart (See row information)     Would patient like information on creating a medical advance directive? No - Patient declined Yes (MAU/Ambulatory/Procedural Areas - Information given);No - Patient declined   Yes (MAU/Ambulatory/Procedural Areas - Information given) No - Patient declined No - Patient declined    Current Medications (verified) Outpatient Encounter Medications as of 04/20/2022  Medication Sig   aspirin EC 81 MG tablet Take 81 mg by mouth at bedtime.   calcium carbonate (OS-CAL) 600 MG TABS tablet Take 600 mg by mouth at bedtime.   Cholecalciferol (VITAMIN D3) 5000 units CAPS Take 1 capsule by mouth daily.   ferrous gluconate (FERGON) 225 (27 Fe) MG tablet Take 240 mg by mouth daily.   gabapentin (NEURONTIN) 300 MG capsule Take 1 capsule (300 mg total) by  mouth 3 (three) times daily.   glyBURIDE (DIABETA) 5 MG tablet Take 1 tablet (5 mg total) by mouth daily with breakfast.   levothyroxine (SYNTHROID) 75 MCG tablet TAKE 1 TABLET BY MOUTH ONCE DAILY BEFORE BREAKFAST.   liraglutide (VICTOZA) 18 MG/3ML SOPN INJECT 1.8MG  INTO THE SKIN ONCE DAILY   lisinopril (ZESTRIL) 10 MG tablet Take 1 tablet (10 mg total) by mouth daily.   metFORMIN (GLUCOPHAGE-XR) 500 MG 24 hr tablet Take 2 tablets by mouth in the morning and at bedtime.   ONE TOUCH ULTRA TEST test strip CHECK BLOOD SUGAR ONCE DAILY   pioglitazone (ACTOS) 30 MG tablet Take 1 tablet by mouth daily   pravastatin (PRAVACHOL) 40 MG tablet Take 1 tablet by mouth at bedtime.   raloxifene (EVISTA) 60 MG tablet Take 1 tablet (60 mg total) by mouth daily.   vitamin B-12 (CYANOCOBALAMIN) 1000 MCG tablet Take 1,000 mcg by mouth at bedtime.   No facility-administered encounter medications on file as of 04/20/2022.    Allergies (verified) Patient has no known allergies.   History: Past Medical History:  Diagnosis Date   Chronic kidney disease    Diabetes mellitus without complication    Hyperlipidemia    Hypertension    Hypothyroidism    Osteopenia    Osteoporosis    Thyroid disease    Past Surgical History:  Procedure Laterality Date   CESAREAN SECTION  COLONOSCOPY WITH PROPOFOL N/A 05/03/2017   Procedure: COLONOSCOPY WITH PROPOFOL;  Surgeon: [name], MD;  Location: [facility] ENDOSCOPY;  Service: Endoscopy;  Laterality: N/A;   VENTRAL HERNIA REPAIR N/A 10/31/2017   Procedure: LAPAROSCOPIC VENTRAL HERNIA;  Surgeon: Carolan Shiver, MD;  Location: [facility] ORS;  Service: General;  Laterality: N/A;   VENTRAL HERNIA REPAIR N/A 10/31/2017   Procedure: HERNIA REPAIR VENTRAL ADULT;  Surgeon: Carolan Shiver, MD;  Location: [facility] ORS;  Service: General;  Laterality: N/A;   Family History  Problem Relation Age of Onset   Cancer Mother 24       melanoma   Diabetes Mother    Thyroid  disease Mother    Cancer Father 78       prostate to bone   Diabetes Daughter    Heart disease Maternal Grandfather    Stroke Paternal Grandmother    Stroke Paternal Grandfather    Breast cancer Neg Hx    Social History   Socioeconomic History   Marital status: Married    Spouse name: Not on file   Number of children: Not on file   Years of education: Not on file   Highest education level: Not on file  Occupational History   Occupation: retired  Tobacco Use   Smoking status: Never   Smokeless tobacco: Never  Vaping Use   Vaping Use: Never used  Substance and Sexual Activity   Alcohol use: No    Alcohol/week: 0.0 standard drinks of alcohol   Drug use: No   Sexual activity: Yes  Other Topics Concern   Not on file  Social History Narrative   Not on file   Social Determinants of Health   Financial Resource Strain: Low Risk  (04/20/2022)   Overall Financial Resource Strain (CARDIA)    Difficulty of Paying Living Expenses: Not hard at all  Food Insecurity: No Food Insecurity (04/20/2022)   Hunger Vital Sign    Worried About Running Out of Food in the Last Year: Never true    Ran Out of Food in the Last Year: Never true  Transportation Needs: No Transportation Needs (04/20/2022)   PRAPARE - Administrator, Civil Service (Medical): No    Lack of Transportation (Non-Medical): No  Physical Activity: Insufficiently Active (04/20/2022)   Exercise Vital Sign    Days of Exercise per Week: 3 days    Minutes of Exercise per Session: 30 min  Stress: No Stress Concern Present (04/20/2022)   [employer] of Occupational Health - Occupational Stress Questionnaire    Feeling of Stress : Not at all  Social Connections: Socially Integrated (04/20/2022)   Social Connection and Isolation Panel [NHANES]    Frequency of Communication with Friends and Family: More than three times a week    Frequency of Social Gatherings with Friends and Family: More than three times a week     Attends Religious Services: More than 4 times per year    Active Member of [employer] or Organizations: Yes    Attends Engineer, structural: More than 4 times per year    Marital Status: Married    Tobacco Counseling Counseling given: Not Answered   Clinical Intake:  Pre-visit preparation completed: Yes  Pain : No/denies pain     Nutritional Risks: None Diabetes: Yes CBG done?: No Did pt. bring in CBG monitor from home?: No  How often do you need to have someone help you when you read instructions, pamphlets, or other written materials from  your doctor or pharmacy?: 1 - Never  Diabetic?yes Nutrition Risk Assessment:  Has the patient had any N/V/D within the last 2 months?  No  Does the patient have any non-healing wounds?  No  Has the patient had any unintentional weight loss or weight gain?  No   Diabetes:  Is the patient diabetic?  Yes  If diabetic, was a CBG obtained today?  No  Did the patient bring in their glucometer from home?  No  How often do you monitor your CBG's? Once/day.   Financial Strains and Diabetes Management:  Are you having any financial strains with the device, your supplies or your medication? No .  Does the patient want to be seen by Chronic Care Management for management of their diabetes?  No  Would the patient like to be referred to a Nutritionist or for Diabetic Management?  No   Diabetic Exams:  Diabetic Eye Exam: Completed 10/05/21.Marland Kitchen Pt has been advised about the importance in completing this exam.  Diabetic Foot Exam: Completed 05/05/21. Pt has been advised about the importance in completing this exam.   Interpreter Needed?: No  Information entered by :: Kennedy Bucker, LPN   Activities of Daily Living    04/20/2022   11:36 AM  In your present state of health, do you have any difficulty performing the following activities:  Hearing? 0  Vision? 0  Difficulty concentrating or making decisions? 0  Walking or climbing stairs? 0   Dressing or bathing? 0  Doing errands, shopping? 0  Preparing Food and eating ? N  Using the Toilet? N  In the past six months, have you accidently leaked urine? N  Do you have problems with loss of bowel control? N  Managing your Medications? N  Managing your Finances? N  Housekeeping or managing your Housekeeping? N    Patient Care Team: Larae Grooms, NP as PCP - General (Nurse Practitioner)  Indicate any recent Medical Services you may have received from other than [facility] providers in the past year (date may be approximate).     Assessment:   This is a routine wellness examination for [place].  Hearing/Vision screen Hearing Screening - Comments:: No aids Vision Screening - Comments:: Wears glasses- Dr.Patty  Dietary issues and exercise activities discussed: Current Exercise Habits: Home exercise routine, Type of exercise: walking, Time (Minutes): 30, Frequency (Times/Week): 3, Weekly Exercise (Minutes/Week): 90, Intensity: Mild   Goals Addressed             This Visit's Progress    DIET - EAT MORE FRUITS AND VEGETABLES         Depression Screen    04/20/2022   11:34 AM 11/04/2021    8:44 AM 05/05/2021    8:23 AM 12/01/2020    9:13 AM 11/20/2020    8:51 AM 11/04/2020    8:27 AM 11/04/2020    8:26 AM  PHQ 2/9 Scores  PHQ - 2 Score 0 0 0 0 0 0 0  PHQ- 9 Score 0 0 0 0 0 0     Fall Risk    04/20/2022   11:35 AM 11/04/2021    8:44 AM 05/05/2021    8:23 AM 12/01/2020    9:05 AM 11/20/2020    8:50 AM  Fall Risk   Falls in the past year? 0 0 0 0 0  Number falls in past yr: 0 0 0 0 0  Injury with Fall? 0 0 0 0 0  Risk for fall due to :  No Fall Risks No Fall Risks No Fall Risks  No Fall Risks  Follow up Falls prevention discussed;Falls evaluation completed Falls evaluation completed Falls evaluation completed Falls evaluation completed;Falls prevention discussed Falls evaluation completed    FALL RISK PREVENTION PERTAINING TO THE HOME:  Any stairs in or  around the home? Yes  If so, are there any without handrails? No  Home free of loose throw rugs in walkways, pet beds, electrical cords, etc? Yes  Adequate lighting in your home to reduce risk of falls? Yes   ASSISTIVE DEVICES UTILIZED TO PREVENT FALLS:  Life alert? No  Use of a cane, walker or w/c? No  Grab bars in the bathroom? Yes  Shower chair or bench in shower? Yes  Elevated toilet seat or a handicapped toilet? Yes   Cognitive Function:        04/20/2022   11:42 AM 11/30/2019    9:05 AM 11/29/2018    9:09 AM 11/24/2017    1:08 PM  6CIT Screen  What Year? 0 points 0 points 0 points 0 points  What month? 0 points 0 points 0 points 0 points  What time? 0 points 0 points 0 points 0 points  Count back from 20 0 points 0 points 0 points 0 points  Months in reverse 0 points 0 points 0 points 0 points  Repeat phrase 0 points 0 points 0 points 2 points  Total Score 0 points 0 points 0 points 2 points    Immunizations Immunization History  Administered Date(s) Administered   Fluad Quad(high Dose 65+) 11/04/2020, 11/04/2021   Influenza, High Dose Seasonal PF 11/24/2015, 11/23/2016, 11/24/2017   Influenza,inj,Quad PF,6+ Mos 11/18/2014   Influenza-Unspecified 10/30/2018, 11/06/2019   Moderna Sars-Covid-2 Vaccination 02/07/2019, 03/07/2019, 11/13/2019   Pneumococcal Conjugate-13 11/18/2014   Pneumococcal Polysaccharide-23 04/16/2013   Td 08/19/2004   Tdap 11/18/2014   Zoster, Live 10/01/2009    TDAP status: Up to date  Flu Vaccine status: Up to date  Pneumococcal vaccine status: Up to date  Covid-19 vaccine status: Completed vaccines  Qualifies for Shingles Vaccine? Yes   Zostavax completed Yes   Shingrix Completed?: No.    Education has been provided regarding the importance of this vaccine. Patient has been advised to call insurance company to determine out of pocket expense if they have not yet received this vaccine. Advised may also receive vaccine at local pharmacy  or Health Dept. Verbalized acceptance and understanding.  Screening Tests Health Maintenance  Topic Date Due   Zoster Vaccines- Shingrix (1 of 2) Never done   COVID-19 Vaccine (4 - 2023-24 season) 09/11/2021   FOOT EXAM  05/06/2022   HEMOGLOBIN A1C  05/06/2022   INFLUENZA VACCINE  08/12/2022   OPHTHALMOLOGY EXAM  10/06/2022   Diabetic kidney evaluation - eGFR measurement  11/05/2022   Diabetic kidney evaluation - Urine ACR  11/05/2022   MAMMOGRAM  01/20/2023   Medicare Annual Wellness (AWV)  04/20/2023   DTaP/Tdap/Td (3 - Td or Tdap) 11/17/2024   COLONOSCOPY (Pts 45-4173yrs Insurance coverage will need to be confirmed)  05/04/2027   Pneumonia Vaccine 3965+ Years old  Completed   DEXA SCAN  Completed   Hepatitis C Screening  Completed   HPV VACCINES  Aged Out    Health Maintenance  Health Maintenance Due  Topic Date Due   Zoster Vaccines- Shingrix (1 of 2) Never done   COVID-19 Vaccine (4 - 2023-24 season) 09/11/2021    Colorectal cancer screening: Type of screening: Colonoscopy. Completed 05/03/17. Repeat  every 10 years  Mammogram status: Completed 01/19/22. Repeat every year  Bone Density status: Completed 01/19/22. Results reflect: Bone density results: OSTEOPENIA. Repeat every 5 years.  Lung Cancer Screening: (Low Dose CT Chest recommended if Age 6-80 years, 30 pack-year currently smoking OR have quit w/in 15years.) does not qualify.   Additional Screening:  Hepatitis C Screening: does qualify; Completed 11/18/14  Vision Screening: Recommended annual ophthalmology exams for early detection of glaucoma and other disorders of the eye. Is the patient up to date with their annual eye exam?  Yes  Who is the provider or what is the name of the office in which the patient attends annual eye exams? Dr.Patty If pt is not established with a provider, would they like to be referred to a provider to establish care? No .   Dental Screening: Recommended annual dental exams for proper oral  hygiene  Community Resource Referral / Chronic Care Management: CRR required this visit?  No   CCM required this visit?  No      Plan:     I have personally reviewed and noted the following in the patient's chart:   Medical and social history Use of alcohol, tobacco or illicit drugs  Current medications and supplements including opioid prescriptions. Patient is not currently taking opioid prescriptions. Functional ability and status Nutritional status Physical activity Advanced directives List of other physicians Hospitalizations, surgeries, and ER visits in previous 12 months Vitals Screenings to include cognitive, depression, and falls Referrals and appointments  In addition, I have reviewed and discussed with patient certain preventive protocols, quality metrics, and best practice recommendations. A written personalized care plan for preventive services as well as general preventive health recommendations were provided to patient.     Hal Hope, LPN   04/15/8183   Nurse Notes: none

## 2022-04-20 NOTE — Patient Instructions (Addendum)
Emma Berry , Thank you for taking time to come for your Medicare Wellness Visit. I appreciate your ongoing commitment to your health goals. Please review the following plan we discussed and let me know if I can assist you in the future.   These are the goals we discussed:  Goals      DIET - EAT MORE FRUITS AND VEGETABLES     Increase physical activity     Patient Stated     11/30/2019, drink more water        This is a list of the screening recommended for you and due dates:  Health Maintenance  Topic Date Due   Zoster (Shingles) Vaccine (1 of 2) Never done   COVID-19 Vaccine (4 - 2023-24 season) 09/11/2021   Complete foot exam   05/06/2022   Hemoglobin A1C  05/06/2022   Flu Shot  08/12/2022   Eye exam for diabetics  10/06/2022   Yearly kidney function blood test for diabetes  11/05/2022   Yearly kidney health urinalysis for diabetes  11/05/2022   Mammogram  01/20/2023   Medicare Annual Wellness Visit  04/20/2023   DTaP/Tdap/Td vaccine (3 - Td or Tdap) 11/17/2024   Colon Cancer Screening  05/04/2027   Pneumonia Vaccine  Completed   DEXA scan (bone density measurement)  Completed   Hepatitis C Screening: USPSTF Recommendation to screen - Ages 26-79 yo.  Completed   HPV Vaccine  Aged Out    Advanced directives: no  Conditions/risks identified: none  Next appointment: Follow up in one year for your annual wellness visit 04/26/22 @ 10:45 am by phone   Preventive Care 65 Years and Older, Female Preventive care refers to lifestyle choices and visits with your health care provider that can promote health and wellness. What does preventive care include? A yearly physical exam. This is also called an annual well check. Dental exams once or twice a year. Routine eye exams. Ask your health care provider how often you should have your eyes checked. Personal lifestyle choices, including: Daily care of your teeth and gums. Regular physical activity. Eating a healthy diet. Avoiding  tobacco and drug use. Limiting alcohol use. Practicing safe sex. Taking low-dose aspirin every day. Taking vitamin and mineral supplements as recommended by your health care provider. What happens during an annual well check? The services and screenings done by your health care provider during your annual well check will depend on your age, overall health, lifestyle risk factors, and family history of disease. Counseling  Your health care provider may ask you questions about your: Alcohol use. Tobacco use. Drug use. Emotional well-being. Home and relationship well-being. Sexual activity. Eating habits. History of falls. Memory and ability to understand (cognition). Work and work Astronomer. Reproductive health. Screening  You may have the following tests or measurements: Height, weight, and BMI. Blood pressure. Lipid and cholesterol levels. These may be checked every 5 years, or more frequently if you are over 64 years old. Skin check. Lung cancer screening. You may have this screening every year starting at age 48 if you have a 30-pack-year history of smoking and currently smoke or have quit within the past 15 years. Fecal occult blood test (FOBT) of the stool. You may have this test every year starting at age 60. Flexible sigmoidoscopy or colonoscopy. You may have a sigmoidoscopy every 5 years or a colonoscopy every 10 years starting at age 21. Hepatitis C blood test. Hepatitis B blood test. Sexually transmitted disease (STD) testing. Diabetes screening.  This is done by checking your blood sugar (glucose) after you have not eaten for a while (fasting). You may have this done every 1-3 years. Bone density scan. This is done to screen for osteoporosis. You may have this done starting at age 74. Mammogram. This may be done every 1-2 years. Talk to your health care provider about how often you should have regular mammograms. Talk with your health care provider about your test  results, treatment options, and if necessary, the need for more tests. Vaccines  Your health care provider may recommend certain vaccines, such as: Influenza vaccine. This is recommended every year. Tetanus, diphtheria, and acellular pertussis (Tdap, Td) vaccine. You may need a Td booster every 10 years. Zoster vaccine. You may need this after age 78. Pneumococcal 13-valent conjugate (PCV13) vaccine. One dose is recommended after age 40. Pneumococcal polysaccharide (PPSV23) vaccine. One dose is recommended after age 73. Talk to your health care provider about which screenings and vaccines you need and how often you need them. This information is not intended to replace advice given to you by your health care provider. Make sure you discuss any questions you have with your health care provider. Document Released: 01/24/2015 Document Revised: 09/17/2015 Document Reviewed: 10/29/2014 Elsevier Interactive Patient Education  2017 [place] Prevention in the Home Falls can cause injuries. They can happen to people of all ages. There are many things you can do to make your home safe and to help prevent falls. What can I do on the outside of my home? Regularly fix the edges of walkways and driveways and fix any cracks. Remove anything that might make you trip as you walk through a door, such as a raised step or threshold. Trim any bushes or trees on the path to your home. Use bright outdoor lighting. Clear any walking paths of anything that might make someone trip, such as rocks or tools. Regularly check to see if handrails are loose or broken. Make sure that both sides of any steps have handrails. Any raised decks and porches should have guardrails on the edges. Have any leaves, snow, or ice cleared regularly. Use sand or salt on walking paths during winter. Clean up any spills in your garage right away. This includes oil or grease spills. What can I do in the bathroom? Use night  lights. Install grab bars by the toilet and in the tub and shower. Do not use towel bars as grab bars. Use non-skid mats or decals in the tub or shower. If you need to sit down in the shower, use a plastic, non-slip stool. Keep the floor dry. Clean up any water that spills on the floor as soon as it happens. Remove soap buildup in the tub or shower regularly. Attach bath mats securely with double-sided non-slip rug tape. Do not have throw rugs and other things on the floor that can make you trip. What can I do in the bedroom? Use night lights. Make sure that you have a light by your bed that is easy to reach. Do not use any sheets or blankets that are too big for your bed. They should not hang down onto the floor. Have a firm chair that has side arms. You can use this for support while you get dressed. Do not have throw rugs and other things on the floor that can make you trip. What can I do in the kitchen? Clean up any spills right away. Avoid walking on wet floors. Keep items  that you use a lot in easy-to-reach places. If you need to reach something above you, use a strong step stool that has a grab bar. Keep electrical cords out of the way. Do not use floor polish or wax that makes floors slippery. If you must use wax, use non-skid floor wax. Do not have throw rugs and other things on the floor that can make you trip. What can I do with my stairs? Do not leave any items on the stairs. Make sure that there are handrails on both sides of the stairs and use them. Fix handrails that are broken or loose. Make sure that handrails are as long as the stairways. Check any carpeting to make sure that it is firmly attached to the stairs. Fix any carpet that is loose or worn. Avoid having throw rugs at the top or bottom of the stairs. If you do have throw rugs, attach them to the floor with carpet tape. Make sure that you have a light switch at the top of the stairs and the bottom of the stairs. If  you do not have them, ask someone to add them for you. What else can I do to help prevent falls? Wear shoes that: Do not have high heels. Have rubber bottoms. Are comfortable and fit you well. Are closed at the toe. Do not wear sandals. If you use a stepladder: Make sure that it is fully opened. Do not climb a closed stepladder. Make sure that both sides of the stepladder are locked into place. Ask someone to hold it for you, if possible. Clearly mark and make sure that you can see: Any grab bars or handrails. First and last steps. Where the edge of each step is. Use tools that help you move around (mobility aids) if they are needed. These include: Canes. Walkers. Scooters. Crutches. Turn on the lights when you go into a dark area. Replace any light bulbs as soon as they burn out. Set up your furniture so you have a clear path. Avoid moving your furniture around. If any of your floors are uneven, fix them. If there are any pets around you, be aware of where they are. Review your medicines with your doctor. Some medicines can make you feel dizzy. This can increase your chance of falling. Ask your doctor what other things that you can do to help prevent falls. This information is not intended to replace advice given to you by your health care provider. Make sure you discuss any questions you have with your health care provider. Document Released: 10/24/2008 Document Revised: 06/05/2015 Document Reviewed: 02/01/2014 Elsevier Interactive Patient Education  2017 [employer].

## 2022-04-27 ENCOUNTER — Other Ambulatory Visit: Payer: Self-pay

## 2022-05-06 ENCOUNTER — Ambulatory Visit (INDEPENDENT_AMBULATORY_CARE_PROVIDER_SITE_OTHER): Payer: Medicare HMO | Admitting: Nurse Practitioner

## 2022-05-06 ENCOUNTER — Encounter: Payer: Self-pay | Admitting: Nurse Practitioner

## 2022-05-06 VITALS — BP 118/66 | HR 62 | Temp 98.3°F | Ht 62.0 in | Wt 157.8 lb

## 2022-05-06 DIAGNOSIS — I152 Hypertension secondary to endocrine disorders: Secondary | ICD-10-CM

## 2022-05-06 DIAGNOSIS — E1129 Type 2 diabetes mellitus with other diabetic kidney complication: Secondary | ICD-10-CM | POA: Diagnosis not present

## 2022-05-06 DIAGNOSIS — R809 Proteinuria, unspecified: Secondary | ICD-10-CM

## 2022-05-06 DIAGNOSIS — E1159 Type 2 diabetes mellitus with other circulatory complications: Secondary | ICD-10-CM

## 2022-05-06 DIAGNOSIS — I700 Atherosclerosis of aorta: Secondary | ICD-10-CM | POA: Diagnosis not present

## 2022-05-06 DIAGNOSIS — E785 Hyperlipidemia, unspecified: Secondary | ICD-10-CM | POA: Diagnosis not present

## 2022-05-06 DIAGNOSIS — N1831 Chronic kidney disease, stage 3a: Secondary | ICD-10-CM

## 2022-05-06 DIAGNOSIS — E538 Deficiency of other specified B group vitamins: Secondary | ICD-10-CM

## 2022-05-06 DIAGNOSIS — E1169 Type 2 diabetes mellitus with other specified complication: Secondary | ICD-10-CM | POA: Diagnosis not present

## 2022-05-06 DIAGNOSIS — E039 Hypothyroidism, unspecified: Secondary | ICD-10-CM | POA: Diagnosis not present

## 2022-05-06 MED ORDER — GLYBURIDE 5 MG PO TABS: 5.0000 mg | ORAL_TABLET | Freq: Every day | ORAL | 1 refills | Status: DC

## 2022-05-06 MED ORDER — LEVOTHYROXINE SODIUM 75 MCG PO TABS: ORAL_TABLET | ORAL | 1 refills | Status: DC

## 2022-05-06 MED ORDER — GABAPENTIN 300 MG PO CAPS: 300.0000 mg | ORAL_CAPSULE | Freq: Three times a day (TID) | ORAL | 1 refills | Status: DC

## 2022-05-06 MED ORDER — METFORMIN HCL ER 500 MG PO TB24: ORAL_TABLET | ORAL | 1 refills | Status: DC

## 2022-05-06 MED ORDER — RALOXIFENE HCL 60 MG PO TABS: 60.0000 mg | ORAL_TABLET | Freq: Every day | ORAL | 2 refills | Status: DC

## 2022-05-06 MED ORDER — PIOGLITAZONE HCL 30 MG PO TABS: ORAL_TABLET | ORAL | 1 refills | Status: DC

## 2022-05-06 MED ORDER — VICTOZA 18 MG/3ML ~~LOC~~ SOPN: PEN_INJECTOR | SUBCUTANEOUS | 1 refills | Status: DC

## 2022-05-06 MED ORDER — PRAVASTATIN SODIUM 40 MG PO TABS: ORAL_TABLET | ORAL | 1 refills | Status: DC

## 2022-05-06 MED ORDER — LISINOPRIL 10 MG PO TABS: 10.0000 mg | ORAL_TABLET | Freq: Every day | ORAL | 1 refills | Status: DC

## 2022-05-06 NOTE — Assessment & Plan Note (Signed)
Labs ordered at visit today.  Will make recommendations based on lab results.   

## 2022-05-06 NOTE — Assessment & Plan Note (Signed)
Chronic.  Controlled.  Continue with current medication regimen on Pravastatin .  Refills sent today.  Labs ordered today.  Return to clinic in 6 months for reevaluation.  Call sooner if concerns arise.

## 2022-05-06 NOTE — Assessment & Plan Note (Signed)
Labs ordered today. Will make recommendations based on lab results. Continue with current dose of Levothyroxine.  Refills sent today.

## 2022-05-06 NOTE — Assessment & Plan Note (Signed)
Noted on CT imaging 07/01/2017.  Continue pravastatin and ASA daily for prevention + good diabetes control.  Continue with Pravastatin.

## 2022-05-06 NOTE — Progress Notes (Signed)
BP 118/66   Pulse 62   Temp 98.3 F (36.8 C) (Oral)   Ht 5\' 2"  (1.575 m)   Wt 157 lb 12.8 oz (71.6 kg)   LMP  (LMP Unknown)   SpO2 98%   BMI 28.86 kg/m    Subjective:    Patient ID: Emma Berry, female    DOB: 26-Feb-1950, 72 y.o.   MRN: [id number]   HPI: Emma Berry is a 72 y.o. female  Chief Complaint  Patient presents with   Hypertension   HYPERTENSION / HYPERLIPIDEMIA Satisfied with current treatment? yes Duration of hypertension: years BP monitoring frequency: daily BP range: 110/60 BP medication side effects: no Past BP meds: quinapril Duration of hyperlipidemia: years Cholesterol medication side effects: no Cholesterol supplements: none Past cholesterol medications: pravastatin (pravachol) Medication compliance: excellent compliance Aspirin: yes Recent stressors: no Recurrent headaches: no Visual changes: no Palpitations: no Dyspnea: no Chest pain: no Lower extremity edema: no Dizzy/lightheaded: no  HYPOTHYROIDISM Thyroid control status:controlled Satisfied with current treatment? yes Medication side effects: no Medication compliance: excellent compliance Etiology of hypothyroidism:  Recent dose adjustment:no Fatigue: no Cold intolerance: no Heat intolerance: no Weight gain: no Weight loss: no Constipation: no Diarrhea/loose stools: no Palpitations: no Lower extremity edema: no Anxiety/depressed mood: no  CHRONIC KIDNEY DISEASE CKD status: controlled Medications renally dose: no Previous renal evaluation: no Pneumovax:  Up to Date Influenza Vaccine:  Up to Date  DIABETES Hypoglycemic episodes:no symptoms; but does report lows first thing in the morning Polydipsia/polyuria: no Visual disturbance: no Chest pain: no Paresthesias: no Glucose Monitoring: no  Accucheck frequency: Daily  Fasting glucose: 70-80  Post prandial:  Evening:  Before meals: Taking Insulin?: no  Long acting insulin:  Short acting insulin: Blood  Pressure Monitoring: weekly Retinal Examination: Up to Date Foot Exam: Completed this visit Diabetic Education: Not Completed Pneumovax: Up to Date Influenza:Completed this visit Aspirin: yes  HIP PAIN Patient states her hip bothers her at times but not enough to really bother her.  She still works in her garden and plants her flowers.     Relevant past medical, surgical, family and social history reviewed and updated as indicated. Interim medical history since our last visit reviewed. Allergies and medications reviewed and updated.  Review of Systems  Constitutional:  Negative for activity change, appetite change, chills and fever.  Eyes:  Negative for visual disturbance.  Respiratory:  Negative for cough, chest tightness and shortness of breath.   Cardiovascular:  Negative for chest pain, palpitations and leg swelling.  Gastrointestinal:  Negative for constipation and diarrhea.  Endocrine: Negative for polydipsia and polyuria.  Neurological:  Negative for dizziness, light-headedness, numbness and headaches.  Psychiatric/Behavioral:  Negative for confusion, sleep disturbance and suicidal ideas.     Per HPI unless specifically indicated above     Objective:    BP 118/66   Pulse 62   Temp 98.3 F (36.8 C) (Oral)   Ht 5\' 2"  (1.575 m)   Wt 157 lb 12.8 oz (71.6 kg)   LMP  (LMP Unknown)   SpO2 98%   BMI 28.86 kg/m   Wt Readings from Last 3 Encounters:  05/06/22 157 lb 12.8 oz (71.6 kg)  04/20/22 149 lb (67.6 kg)  11/04/21 149 lb 9.6 oz (67.9 kg)    Physical Exam Vitals and nursing note reviewed.  Constitutional:      General: She is not in acute distress.    Appearance: Normal appearance. She is normal weight. She is not  ill-appearing, toxic-appearing or diaphoretic.  HENT:     Head: Normocephalic.     Right Ear: External ear normal.     Left Ear: External ear normal.     Nose: Nose normal.     Mouth/Throat:     Mouth: Mucous membranes are moist.     Pharynx:  Oropharynx is clear.  Eyes:     General:        Right eye: No discharge.        Left eye: No discharge.     Extraocular Movements: Extraocular movements intact.     Conjunctiva/sclera: Conjunctivae normal.     Pupils: Pupils are equal, round, and reactive to light.  Cardiovascular:     Rate and Rhythm: Normal rate and regular rhythm.     Heart sounds: No murmur heard. Pulmonary:     Effort: Pulmonary effort is normal. No respiratory distress.     Breath sounds: Normal breath sounds. No wheezing or rales.  Musculoskeletal:     Cervical back: Normal range of motion and neck supple.  Skin:    General: Skin is warm and dry.     Capillary Refill: Capillary refill takes less than 2 seconds.  Neurological:     General: No focal deficit present.     Mental Status: She is alert and oriented to person, place, and time. Mental status is at baseline.  Psychiatric:        Mood and Affect: Mood normal.        Behavior: Behavior normal.        Thought Content: Thought content normal.        Judgment: Judgment normal.     Results for orders placed or performed in visit on 11/05/21  HM DIABETES EYE EXAM  Result Value Ref Range   HM Diabetic Eye Exam Retinopathy (A) No Retinopathy      Assessment & Plan:   Problem List Items Addressed This Visit       Cardiovascular and Mediastinum   Hypertension associated with diabetes - Primary    Chronic.  Controlled.  Continue with current medication regimen of Lisinopril .  Refills sent today.  Labs ordered today.  Return to clinic in 6 months for reevaluation.  Call sooner if concerns arise.        Relevant Medications   glyBURIDE (DIABETA) 5 MG tablet   liraglutide (VICTOZA) 18 MG/3ML SOPN   lisinopril (ZESTRIL) 10 MG tablet   metFORMIN (GLUCOPHAGE-XR) 500 MG 24 hr tablet   pioglitazone (ACTOS) 30 MG tablet   pravastatin (PRAVACHOL) 40 MG tablet   Other Relevant Orders   Comp Met (CMET)   Aortic atherosclerosis    Noted on CT  imaging 07/01/2017.  Continue pravastatin and ASA daily for prevention + good diabetes control.  Continue with Pravastatin.      Relevant Medications   lisinopril (ZESTRIL) 10 MG tablet   pravastatin (PRAVACHOL) 40 MG tablet     Endocrine   Type 2 diabetes mellitus with proteinuria    Chronic.  Controlled.  Continue with current medication regimen on Victoza, Metformin, Actos, and Glyburide. Refills sent today.   Sugars running 60-80 fasting.  Labs ordered today.  Return to clinic in 6 months for reevaluation.  Call sooner if concerns arise.        Relevant Medications   glyBURIDE (DIABETA) 5 MG tablet   liraglutide (VICTOZA) 18 MG/3ML SOPN   lisinopril (ZESTRIL) 10 MG tablet   metFORMIN (GLUCOPHAGE-XR) 500 MG 24 hr tablet  pioglitazone (ACTOS) 30 MG tablet   pravastatin (PRAVACHOL) 40 MG tablet   Other Relevant Orders   HgB A1c   Hyperlipidemia associated with type 2 diabetes mellitus    Chronic.  Controlled.  Continue with current medication regimen on Pravastatin .  Refills sent today.  Labs ordered today.  Return to clinic in 6 months for reevaluation.  Call sooner if concerns arise.        Relevant Medications   glyBURIDE (DIABETA) 5 MG tablet   liraglutide (VICTOZA) 18 MG/3ML SOPN   lisinopril (ZESTRIL) 10 MG tablet   metFORMIN (GLUCOPHAGE-XR) 500 MG 24 hr tablet   pioglitazone (ACTOS) 30 MG tablet   pravastatin (PRAVACHOL) 40 MG tablet   Other Relevant Orders   Lipid Profile   Hypothyroidism    Labs ordered today. Will make recommendations based on lab results. Continue with current dose of Levothyroxine.  Refills sent today.        Relevant Medications   levothyroxine (SYNTHROID) 75 MCG tablet   Other Relevant Orders   TSH   T4, free     Genitourinary   CKD (chronic kidney disease) stage 3, GFR 30-59 ml/min    Labs ordered at visit today.  Will make recommendations based on lab results.          Other   B12 deficiency    Labs ordered at visit today.   Will make recommendations based on lab results.        Relevant Orders   B12     Follow up plan: Return in about 6 months (around 11/05/2022) for Physical and Fasting labs.

## 2022-05-06 NOTE — Assessment & Plan Note (Signed)
Chronic.  Controlled.  Continue with current medication regimen on Victoza, Metformin, Actos, and Glyburide. Refills sent today.   Sugars running 60-80 fasting.  Labs ordered today.  Return to clinic in 6 months for reevaluation.  Call sooner if concerns arise.

## 2022-05-06 NOTE — Assessment & Plan Note (Signed)
Chronic.  Controlled.  Continue with current medication regimen of Lisinopril 10mg.  Refills sent today. Labs ordered today.  Return to clinic in 6 months for reevaluation.  Call sooner if concerns arise.   

## 2022-05-07 LAB — LIPID PANEL
Chol/HDL Ratio: 2.3 ratio (ref 0.0–4.4)
Cholesterol, Total: 128 mg/dL (ref 100–199)
HDL: 55 mg/dL (ref >39)
LDL Chol Calc (NIH): 59 mg/dL (ref 0–99)
Triglycerides: 66 mg/dL (ref 0–149)
VLDL Cholesterol Cal: 14 mg/dL (ref 5–40)

## 2022-05-07 LAB — COMPREHENSIVE METABOLIC PANEL
ALT: 10 IU/L (ref 0–32)
AST: 19 IU/L (ref 0–40)
Albumin/Globulin Ratio: 1.9 (ref 1.2–2.2)
Albumin: 4.2 g/dL (ref 3.8–4.8)
Alkaline Phosphatase: 66 IU/L (ref 44–121)
BUN/Creatinine Ratio: 18 (ref 12–28)
BUN: 19 mg/dL (ref 8–27)
Bilirubin Total: 0.3 mg/dL (ref 0.0–1.2)
CO2: 24 mmol/L (ref 20–29)
Calcium: 9.6 mg/dL (ref 8.7–10.3)
Chloride: 103 mmol/L (ref 96–106)
Creatinine, Ser: 1.08 mg/dL — ABNORMAL HIGH (ref 0.57–1.00)
Globulin, Total: 2.2 g/dL (ref 1.5–4.5)
Glucose: 76 mg/dL (ref 70–99)
Potassium: 4.8 mmol/L (ref 3.5–5.2)
Sodium: 140 mmol/L (ref 134–144)
Total Protein: 6.4 g/dL (ref 6.0–8.5)
eGFR: 54 mL/min/{1.73_m2} — ABNORMAL LOW (ref >59)

## 2022-05-07 LAB — HEMOGLOBIN A1C
Est. average glucose Bld gHb Est-mCnc: 148 mg/dL
Hgb A1c MFr Bld: 6.8 % — ABNORMAL HIGH (ref 4.8–5.6)

## 2022-05-07 LAB — VITAMIN B12: Vitamin B-12: 1293 pg/mL — ABNORMAL HIGH (ref 232–1245)

## 2022-05-07 LAB — T4, FREE: Free T4: 1.63 ng/dL (ref 0.82–1.77)

## 2022-05-07 LAB — TSH: TSH: 1.32 u[IU]/mL (ref 0.450–4.500)

## 2022-05-07 NOTE — Progress Notes (Signed)
Hi [name]. It was nice to see you yesterday.  Your lab work looks good.  Your A1c did increase to 6.8%.  I recommend decreasing the carbohydrates in your diet.  No other concerns at this time. Continue with your current medication regimen.  Follow up as discussed.  Please let me know if you have any questions.

## 2022-06-22 NOTE — Progress Notes (Unsigned)
LMP  (LMP Unknown)    Subjective:    Patient ID: Emma Berry, female    DOB: Apr 06, 1950, 72 y.o.   MRN: [id number]  HPI: Emma Berry is a 72 y.o. female  No chief complaint on file.  UPPER RESPIRATORY TRACT INFECTION Worst symptom: Fever: {Blank single:19197::"yes","no"} Cough: {Blank single:19197::"yes","no"} Shortness of breath: {Blank single:19197::"yes","no"} Wheezing: {Blank single:19197::"yes","no"} Chest pain: {Blank single:19197::"yes","no","yes, with cough"} Chest tightness: {Blank single:19197::"yes","no"} Chest congestion: {Blank single:19197::"yes","no"} Nasal congestion: {Blank single:19197::"yes","no"} Runny nose: {Blank single:19197::"yes","no"} Post nasal drip: {Blank single:19197::"yes","no"} Sneezing: {Blank single:19197::"yes","no"} Sore throat: {Blank single:19197::"yes","no"} Swollen glands: {Blank single:19197::"yes","no"} Sinus pressure: {Blank single:19197::"yes","no"} Headache: {Blank single:19197::"yes","no"} Face pain: {Blank single:19197::"yes","no"} Toothache: {Blank single:19197::"yes","no"} Ear pain: {Blank single:19197::"yes","no"} {Blank single:19197::""right","left", "bilateral"} Ear pressure: {Blank single:19197::"yes","no"} {Blank single:19197::""right","left", "bilateral"} Eyes red/itching:{Blank single:19197::"yes","no"} Eye drainage/crusting: {Blank single:19197::"yes","no"}  Vomiting: {Blank single:19197::"yes","no"} Rash: {Blank single:19197::"yes","no"} Fatigue: {Blank single:19197::"yes","no"} Sick contacts: {Blank single:19197::"yes","no"} Strep contacts: {Blank single:19197::"yes","no"}  Context: {Blank multiple:19196::"better","worse","stable","fluctuating"} Recurrent sinusitis: {Blank single:19197::"yes","no"} Relief with OTC cold/cough medications: {Blank single:19197::"yes","no"}  Treatments attempted: {Blank multiple:19196::"none","cold/sinus","mucinex","anti-histamine","pseudoephedrine","cough syrup","antibiotics"}     Relevant past medical, surgical, family and social history reviewed and updated as indicated. Interim medical history since our last visit reviewed. Allergies and medications reviewed and updated.  Review of Systems  Per HPI unless specifically indicated above     Objective:    LMP  (LMP Unknown)   Wt Readings from Last 3 Encounters:  05/06/22 157 lb 12.8 oz (71.6 kg)  04/20/22 149 lb (67.6 kg)  11/04/21 149 lb 9.6 oz (67.9 kg)    Physical Exam  Results for orders placed or performed in visit on 05/06/22  Comp Met (CMET)  Result Value Ref Range   Glucose 76 70 - 99 mg/dL   BUN 19 8 - 27 mg/dL   Creatinine, Ser 4.16 (H) 0.57 - 1.00 mg/dL   eGFR 54 (L) >60 YT/KZS/0.10   BUN/Creatinine Ratio 18 12 - 28   Sodium 140 134 - 144 mmol/L   Potassium 4.8 3.5 - 5.2 mmol/L   Chloride 103 96 - 106 mmol/L   CO2 24 20 - 29 mmol/L   Calcium 9.6 8.7 - 10.3 mg/dL   Total Protein 6.4 6.0 - 8.5 g/dL   Albumin 4.2 3.8 - 4.8 g/dL   Globulin, Total 2.2 1.5 - 4.5 g/dL   Albumin/Globulin Ratio 1.9 1.2 - 2.2   Bilirubin Total 0.3 0.0 - 1.2 mg/dL   Alkaline Phosphatase 66 44 - 121 IU/L   AST 19 0 - 40 IU/L   ALT 10 0 - 32 IU/L  HgB A1c  Result Value Ref Range   Hgb A1c MFr Bld 6.8 (H) 4.8 - 5.6 %   Est. average glucose Bld gHb Est-mCnc 148 mg/dL  Lipid Profile  Result Value Ref Range   Cholesterol, Total 128 100 - 199 mg/dL   Triglycerides 66 0 - 149 mg/dL   HDL 55 >93 mg/dL   VLDL Cholesterol Cal 14 5 - 40 mg/dL   LDL Chol Calc (NIH) 59 0 - 99 mg/dL   Chol/HDL Ratio 2.3 0.0 - 4.4 ratio  TSH  Result Value Ref Range   TSH 1.320 0.450 - 4.500 uIU/mL  T4, free  Result Value Ref Range   Free T4 1.63 0.82 - 1.77 ng/dL  A35  Result Value Ref Range   Vitamin B-12 1,293 (H) 232 - 1,245 pg/mL      Assessment & Plan:   Problem List Items Addressed This Visit   None    Follow up  plan: No follow-ups on file.

## 2022-06-23 ENCOUNTER — Encounter: Payer: Self-pay | Admitting: Nurse Practitioner

## 2022-06-23 ENCOUNTER — Ambulatory Visit (INDEPENDENT_AMBULATORY_CARE_PROVIDER_SITE_OTHER): Payer: Medicare HMO | Admitting: Nurse Practitioner

## 2022-06-23 VITALS — BP 113/68 | HR 68 | Temp 98.6°F | Wt 152.2 lb

## 2022-06-23 DIAGNOSIS — J029 Acute pharyngitis, unspecified: Secondary | ICD-10-CM

## 2022-06-23 DIAGNOSIS — J069 Acute upper respiratory infection, unspecified: Secondary | ICD-10-CM

## 2022-06-23 DIAGNOSIS — R051 Acute cough: Secondary | ICD-10-CM

## 2022-06-23 NOTE — Progress Notes (Signed)
Results discussed with patient during visit.

## 2022-06-25 LAB — NOVEL CORONAVIRUS, NAA: SARS-CoV-2, NAA: NOT DETECTED

## 2022-06-25 NOTE — Progress Notes (Signed)
Hi Emma Berry.  Your COVID test was negative.

## 2022-06-26 LAB — CULTURE, GROUP A STREP: Strep A Culture: NEGATIVE

## 2022-06-26 LAB — RAPID STREP SCREEN (MED CTR MEBANE ONLY): Strep Gp A Ag, IA W/Reflex: NEGATIVE

## 2022-08-09 ENCOUNTER — Other Ambulatory Visit: Payer: Self-pay | Admitting: Nurse Practitioner

## 2022-08-10 NOTE — Telephone Encounter (Signed)
Requested Prescriptions  Pending Prescriptions Disp Refills   liraglutide (VICTOZA) 18 MG/3ML SOPN [Pharmacy Med Name: Victoza 3-Pak 0.6 mg/0.1 mL (18 mg/3 mL) subcutaneous pen injector] 9 mL 1    Sig: INJECT 1.8MG  SUBCUTANEOUSLY ONCE DAILY     Endocrinology:  Diabetes - GLP-1 Receptor Agonists Passed - 08/09/2022  2:04 PM      Passed - HBA1C is between 0 and 7.9 and within 180 days    Hemoglobin A1C  Date Value Ref Range Status  11/24/2015 7.2%  Final   HB A1C (BAYER DCA - WAIVED)  Date Value Ref Range Status  12/26/2019 6.2 <7.0 % Final    Comment:                                          Diabetic Adult            <7.0                                       Healthy Adult        4.3 - 5.7                                                           (DCCT/NGSP) American Diabetes Association's Summary of Glycemic Recommendations for Adults with Diabetes: Hemoglobin A1c <7.0%. More stringent glycemic goals (A1c <6.0%) may further reduce complications at the cost of increased risk of hypoglycemia.    Hgb A1c MFr Bld  Date Value Ref Range Status  05/06/2022 6.8 (H) 4.8 - 5.6 % Final    Comment:             Prediabetes: 5.7 - 6.4          Diabetes: >6.4          Glycemic control for adults with diabetes: <7.0          Passed - Valid encounter within last 6 months    Recent Outpatient Visits           1 month ago Acute upper respiratory infection   [place] [facility] Larae Grooms, NP   3 months ago Hypertension associated with diabetes [facility])   [place] [facility] Larae Grooms, NP   9 months ago Hypertension associated with diabetes [facility])   [place] [facility] Larae Grooms, NP   1 year ago Annual physical exam   [place] [facility] Larae Grooms, NP   1 year ago Acute upper respiratory infection   [place] [facility] Larae Grooms, NP       Future Appointments              In 2 months Larae Grooms, NP [place] [facility]

## 2022-08-12 ENCOUNTER — Other Ambulatory Visit: Payer: Self-pay | Admitting: Nurse Practitioner

## 2022-08-13 NOTE — Telephone Encounter (Signed)
Rx request is too soon. Last refill 05/06/22 for 90 and 1 refill.  Requested Prescriptions  Pending Prescriptions Disp Refills   glyBURIDE (DIABETA) 5 MG tablet [Pharmacy Med Name: glyburide 5 mg tablet] 90 tablet 1    Sig: TAKE ONE TABLET BY MOUTH EVERY MORNING WITH FOOD     Endocrinology:  Diabetes - Sulfonylureas - glyburide Failed - 08/12/2022 12:47 PM      Failed - HBA1C in normal range and within 180 days    Hemoglobin A1C  Date Value Ref Range Status  11/24/2015 7.2%  Final   HB A1C (BAYER DCA - WAIVED)  Date Value Ref Range Status  12/26/2019 6.2 <7.0 % Final    Comment:                                          Diabetic Adult            <7.0                                       Healthy Adult        4.3 - 5.7                                                           (DCCT/NGSP) American Diabetes Association's Summary of Glycemic Recommendations for Adults with Diabetes: Hemoglobin A1c <7.0%. More stringent glycemic goals (A1c <6.0%) may further reduce complications at the cost of increased risk of hypoglycemia.    Hgb A1c MFr Bld  Date Value Ref Range Status  05/06/2022 6.8 (H) 4.8 - 5.6 % Final    Comment:             Prediabetes: 5.7 - 6.4          Diabetes: >6.4          Glycemic control for adults with diabetes: <7.0          Failed - Cr in normal range and within 360 days    Creatinine  Date Value Ref Range Status  02/23/2011 0.88 0.60 - 1.30 mg/dL Final   Creatinine, Ser  Date Value Ref Range Status  05/06/2022 1.08 (H) 0.57 - 1.00 mg/dL Final         Failed - eGFR is 60 or above and within 360 days    EGFR (African American)  Date Value Ref Range Status  02/23/2011 >60 >49mL/min Final   GFR calc Af Amer  Date Value Ref Range Status  12/26/2019 64 >59 mL/min/1.73 Final    Comment:    **In accordance with recommendations from the NKF-ASN Task force,**   [facility] is in the process of updating its eGFR calculation to the   2021 CKD-EPI creatinine  equation that estimates kidney function   without a race variable.    EGFR (Non-African Amer.)  Date Value Ref Range Status  02/23/2011 >60 >62mL/min Final    Comment:    eGFR values <31mL/min/1.73 m2 may be an indication of chronic kidney disease (CKD). Calculated eGFR, using the MRDR Study equation, is useful in  patients with stable renal function. The eGFR calculation  will not be reliable in acutely ill patients when serum creatinine is changing rapidly. It is not useful in patients on dialysis. The eGFR calculation may not be applicable to patients at the low and high extremes of body sizes, pregnant women, and vegetarians.    GFR calc non Af Amer  Date Value Ref Range Status  12/26/2019 55 (L) >59 mL/min/1.73 Final   eGFR  Date Value Ref Range Status  05/06/2022 54 (L) >59 mL/min/1.73 Final         Passed - Valid encounter within last 6 months    Recent Outpatient Visits           1 month ago Acute upper respiratory infection   [place] [facility] Larae Grooms, NP   3 months ago Hypertension associated with diabetes [facility])   [place] [facility] Larae Grooms, NP   9 months ago Hypertension associated with diabetes [facility])   [place] [facility] Larae Grooms, NP   1 year ago Annual physical exam   [place] [facility] Larae Grooms, NP   1 year ago Acute upper respiratory infection    [facility] Larae Grooms, NP       Future Appointments             In 2 months Larae Grooms, NP  [facility]

## 2022-08-21 ENCOUNTER — Other Ambulatory Visit: Payer: Self-pay | Admitting: Nurse Practitioner

## 2022-08-23 NOTE — Telephone Encounter (Signed)
Unable to refill per protocol, Rx request is too soon. Last refill 05/06/22 for 90 and 1 refill.  Requested Prescriptions  Pending Prescriptions Disp Refills   levothyroxine (SYNTHROID) 75 MCG tablet [Pharmacy Med Name: levothyroxine 75 mcg tablet] 90 tablet 1    Sig: TAKE ONE TABLET BY MOUTH ONCE DAILY BEFORE BREAKFAST     Endocrinology:  Hypothyroid Agents Passed - 08/21/2022  9:32 AM      Passed - TSH in normal range and within 360 days    TSH  Date Value Ref Range Status  05/06/2022 1.320 0.450 - 4.500 uIU/mL Final         Passed - Valid encounter within last 12 months    Recent Outpatient Visits           2 months ago Acute upper respiratory infection   [place] [facility] Larae Grooms, NP   3 months ago Hypertension associated with diabetes [facility])   [place] [facility] Larae Grooms, NP   9 months ago Hypertension associated with diabetes [facility])   [place] [facility] Larae Grooms, NP   1 year ago Annual physical exam   [place] [facility] Larae Grooms, NP   1 year ago Acute upper respiratory infection   [place] [facility] Larae Grooms, NP       Future Appointments             In 2 months Larae Grooms, NP [place] [facility]             glyBURIDE (DIABETA) 5 MG tablet [Pharmacy Med Name: glyburide 5 mg tablet] 90 tablet 1    Sig: TAKE ONE TABLET BY MOUTH EVERY MORNING WITH FOOD     Endocrinology:  Diabetes - Sulfonylureas - glyburide Failed - 08/21/2022  9:32 AM      Failed - HBA1C in normal range and within 180 days    Hemoglobin A1C  Date Value Ref Range Status  11/24/2015 7.2%  Final   HB A1C (BAYER DCA - WAIVED)  Date Value Ref Range Status  12/26/2019 6.2 <7.0 % Final    Comment:                                          Diabetic Adult            <7.0                                       Healthy Adult        4.3 - 5.7                                                            (DCCT/NGSP) American Diabetes Association's Summary of Glycemic Recommendations for Adults with Diabetes: Hemoglobin A1c <7.0%. More stringent glycemic goals (A1c <6.0%) may further reduce complications at the cost of increased risk of hypoglycemia.    Hgb A1c MFr Bld  Date Value Ref Range Status  05/06/2022 6.8 (H) 4.8 - 5.6 % Final    Comment:  Prediabetes: 5.7 - 6.4          Diabetes: >6.4          Glycemic control for adults with diabetes: <7.0          Failed - Cr in normal range and within 360 days    Creatinine  Date Value Ref Range Status  02/23/2011 0.88 0.60 - 1.30 mg/dL Final   Creatinine, Ser  Date Value Ref Range Status  05/06/2022 1.08 (H) 0.57 - 1.00 mg/dL Final         Failed - eGFR is 60 or above and within 360 days    EGFR (African American)  Date Value Ref Range Status  02/23/2011 >60 >90mL/min Final   GFR calc Af Amer  Date Value Ref Range Status  12/26/2019 64 >59 mL/min/1.73 Final    Comment:    **In accordance with recommendations from the NKF-ASN Task force,**   [facility] is in the process of updating its eGFR calculation to the   2021 CKD-EPI creatinine equation that estimates kidney function   without a race variable.    EGFR (Non-African Amer.)  Date Value Ref Range Status  02/23/2011 >60 >83mL/min Final    Comment:    eGFR values <50mL/min/1.73 m2 may be an indication of chronic kidney disease (CKD). Calculated eGFR, using the MRDR Study equation, is useful in  patients with stable renal function. The eGFR calculation will not be reliable in acutely ill patients when serum creatinine is changing rapidly. It is not useful in patients on dialysis. The eGFR calculation may not be applicable to patients at the low and high extremes of body sizes, pregnant women, and vegetarians.    GFR calc non Af Amer  Date Value Ref Range Status  12/26/2019 55 (L) >59 mL/min/1.73 Final    eGFR  Date Value Ref Range Status  05/06/2022 54 (L) >59 mL/min/1.73 Final         Passed - Valid encounter within last 6 months    Recent Outpatient Visits           2 months ago Acute upper respiratory infection   [place] [facility] Larae Grooms, NP   3 months ago Hypertension associated with diabetes [facility])   [place] [facility] Larae Grooms, NP   9 months ago Hypertension associated with diabetes [facility])   [place] [facility] Larae Grooms, NP   1 year ago Annual physical exam   [place] [facility] Larae Grooms, NP   1 year ago Acute upper respiratory infection    [place] [facility] Larae Grooms, NP       Future Appointments             In 2 months Larae Grooms, NP  [facility]

## 2022-09-05 ENCOUNTER — Other Ambulatory Visit: Payer: Self-pay | Admitting: Nurse Practitioner

## 2022-09-07 NOTE — Telephone Encounter (Signed)
Requested Prescriptions  Pending Prescriptions Disp Refills   gabapentin (NEURONTIN) 300 MG capsule [Pharmacy Med Name: gabapentin 300 mg capsule] 270 capsule 0    Sig: TAKE ONE CAPSULE BY MOUTH THREE TIMES DAILY     Neurology: Anticonvulsants - gabapentin Failed - 09/05/2022  8:04 AM      Failed - Cr in normal range and within 360 days    Creatinine  Date Value Ref Range Status  02/23/2011 0.88 0.60 - 1.30 mg/dL Final   Creatinine, Ser  Date Value Ref Range Status  05/06/2022 1.08 (H) 0.57 - 1.00 mg/dL Final         Passed - Completed PHQ-2 or PHQ-9 in the last 360 days      Passed - Valid encounter within last 12 months    Recent Outpatient Visits           2 months ago Acute upper respiratory infection   [place] [facility] Larae Grooms, NP   4 months ago Hypertension associated with diabetes [facility])   [place] [facility] Larae Grooms, NP   10 months ago Hypertension associated with diabetes [facility])   [place] [facility] Larae Grooms, NP   1 year ago Annual physical exam   [place] [facility] Larae Grooms, NP   1 year ago Acute upper respiratory infection   [place] [facility] Larae Grooms, NP       Future Appointments             In 1 month Larae Grooms, NP [place] [facility]

## 2022-10-26 ENCOUNTER — Other Ambulatory Visit: Payer: Self-pay | Admitting: Nurse Practitioner

## 2022-10-26 NOTE — Telephone Encounter (Signed)
Requested medication (s) are due for refill today:   Yes for both  Requested medication (s) are on the active medication list:   Yes for both  Future visit scheduled:   Yes 10/25 with Clydie Braun   Last ordered: Both 05/06/2022 #90, 1 refill each  Unable to refill because labs are due per protocol.   Requested Prescriptions  Pending Prescriptions Disp Refills   levothyroxine (SYNTHROID) 75 MCG tablet [Pharmacy Med Name: levothyroxine 75 mcg tablet] 90 tablet 1    Sig: TAKE ONE TABLET BY MOUTH ONCE DAILY BEFORE BREAKFAST     Endocrinology:  Hypothyroid Agents Passed - 10/26/2022  8:03 AM      Passed - TSH in normal range and within 360 days    TSH  Date Value Ref Range Status  05/06/2022 1.320 0.450 - 4.500 uIU/mL Final         Passed - Valid encounter within last 12 months    Recent Outpatient Visits           4 months ago Acute upper respiratory infection   [place] [facility] Larae Grooms, NP   5 months ago Hypertension associated with diabetes [facility])   [place] [facility] Larae Grooms, NP   11 months ago Hypertension associated with diabetes [facility])   [place] [facility] Larae Grooms, NP   1 year ago Annual physical exam   [place] [facility] Larae Grooms, NP   1 year ago Acute upper respiratory infection   [place] [facility] Larae Grooms, NP       Future Appointments             In 1 week Larae Grooms, NP [place] [facility]             glyBURIDE (DIABETA) 5 MG tablet [Pharmacy Med Name: glyburide 5 mg tablet] 90 tablet 1    Sig: TAKE ONE TABLET BY MOUTH EVERY MORNING WITH FOOD     Endocrinology:  Diabetes - Sulfonylureas - glyburide Failed - 10/26/2022  8:03 AM      Failed - HBA1C in normal range and within 180 days    Hemoglobin A1C  Date Value Ref Range Status  11/24/2015 7.2%  Final   HB A1C (BAYER DCA - WAIVED)  Date  Value Ref Range Status  12/26/2019 6.2 <7.0 % Final    Comment:                                          Diabetic Adult            <7.0                                       Healthy Adult        4.3 - 5.7                                                           (DCCT/NGSP) American Diabetes Association's Summary of Glycemic Recommendations for Adults with Diabetes: Hemoglobin A1c <7.0%. More stringent glycemic goals (A1c <6.0%) may  further reduce complications at the cost of increased risk of hypoglycemia.    Hgb A1c MFr Bld  Date Value Ref Range Status  05/06/2022 6.8 (H) 4.8 - 5.6 % Final    Comment:             Prediabetes: 5.7 - 6.4          Diabetes: >6.4          Glycemic control for adults with diabetes: <7.0          Failed - Cr in normal range and within 360 days    Creatinine  Date Value Ref Range Status  02/23/2011 0.88 0.60 - 1.30 mg/dL Final   Creatinine, Ser  Date Value Ref Range Status  05/06/2022 1.08 (H) 0.57 - 1.00 mg/dL Final         Failed - eGFR is 60 or above and within 360 days    EGFR (African American)  Date Value Ref Range Status  02/23/2011 >60 >18mL/min Final   GFR calc Af Amer  Date Value Ref Range Status  12/26/2019 64 >59 mL/min/1.73 Final    Comment:    **In accordance with recommendations from the NKF-ASN Task force,**   [facility] is in the process of updating its eGFR calculation to the   2021 CKD-EPI creatinine equation that estimates kidney function   without a race variable.    EGFR (Non-African Amer.)  Date Value Ref Range Status  02/23/2011 >60 >19mL/min Final    Comment:    eGFR values <50mL/min/1.73 m2 may be an indication of chronic kidney disease (CKD). Calculated eGFR, using the MRDR Study equation, is useful in  patients with stable renal function. The eGFR calculation will not be reliable in acutely ill patients when serum creatinine is changing rapidly. It is not useful in patients on dialysis. The eGFR calculation  may not be applicable to patients at the low and high extremes of body sizes, pregnant women, and vegetarians.    GFR calc non Af Amer  Date Value Ref Range Status  12/26/2019 55 (L) >59 mL/min/1.73 Final   eGFR  Date Value Ref Range Status  05/06/2022 54 (L) >59 mL/min/1.73 Final         Passed - Valid encounter within last 6 months    Recent Outpatient Visits           4 months ago Acute upper respiratory infection   [place] [facility] Larae Grooms, NP   5 months ago Hypertension associated with diabetes [facility]   [place] [facility] Larae G[facility], NP   11 months ago Hypertension associated with diabetes [address] [facility])   [place] [facility] Larae Grooms, NP   1 year ago Annual physical exam   [place] [facility] Larae Grooms, NP   1 year ago Acute upper respiratory infection   [place] [facility] Larae Grooms, NP       Future Appointments             In 1 week Larae Grooms, NP [place] [facility]

## 2022-10-31 ENCOUNTER — Other Ambulatory Visit: Payer: Self-pay | Admitting: Nurse Practitioner

## 2022-11-01 NOTE — Telephone Encounter (Signed)
Requested Prescriptions  Pending Prescriptions Disp Refills   liraglutide (VICTOZA) 18 MG/3ML SOPN [Pharmacy Med Name: Victoza 3-Pak 0.6 mg/0.1 mL (18 mg/3 mL) subcutaneous pen injector] 9 mL 1    Sig: INJECT 1.8MG  SUBCUTANEOUSLY ONCE DAILY     Endocrinology:  Diabetes - GLP-1 Receptor Agonists Passed - 10/31/2022  8:02 AM      Passed - HBA1C is between 0 and 7.9 and within 180 days    Hemoglobin A1C  Date Value Ref Range Status  11/24/2015 7.2%  Final   HB A1C (BAYER DCA - WAIVED)  Date Value Ref Range Status  12/26/2019 6.2 <7.0 % Final    Comment:                                          Diabetic Adult            <7.0                                       Healthy Adult        4.3 - 5.7                                                           (DCCT/NGSP) American Diabetes Association's Summary of Glycemic Recommendations for Adults with Diabetes: Hemoglobin A1c <7.0%. More stringent glycemic goals (A1c <6.0%) may further reduce complications at the cost of increased risk of hypoglycemia.    Hgb A1c MFr Bld  Date Value Ref Range Status  05/06/2022 6.8 (H) 4.8 - 5.6 % Final    Comment:             Prediabetes: 5.7 - 6.4          Diabetes: >6.4          Glycemic control for adults with diabetes: <7.0          Passed - Valid encounter within last 6 months    Recent Outpatient Visits           4 months ago Acute upper respiratory infection   [place] [facility] Larae Grooms, NP   5 months ago Hypertension associated with diabetes [facility])   [place] [facility] Larae Grooms, NP   12 months ago Hypertension associated with diabetes [facility])   [place] [facility] Larae Grooms, NP   1 year ago Annual physical exam   [place] Larae Grooms, NP   1 year ago Acute upper respiratory infection   [place] [facility] Larae Grooms, NP       Future Appointments              In 4 days Larae Grooms, NP [place] [facility], [facility]

## 2022-11-04 ENCOUNTER — Other Ambulatory Visit: Payer: Self-pay | Admitting: Nurse Practitioner

## 2022-11-05 ENCOUNTER — Ambulatory Visit (INDEPENDENT_AMBULATORY_CARE_PROVIDER_SITE_OTHER): Payer: Medicare HMO | Admitting: Nurse Practitioner

## 2022-11-05 ENCOUNTER — Encounter: Payer: Self-pay | Admitting: Nurse Practitioner

## 2022-11-05 VITALS — BP 123/74 | HR 63 | Temp 97.9°F | Ht 62.5 in | Wt 158.8 lb

## 2022-11-05 DIAGNOSIS — I700 Atherosclerosis of aorta: Secondary | ICD-10-CM

## 2022-11-05 DIAGNOSIS — I152 Hypertension secondary to endocrine disorders: Secondary | ICD-10-CM

## 2022-11-05 DIAGNOSIS — Z23 Encounter for immunization: Secondary | ICD-10-CM | POA: Diagnosis not present

## 2022-11-05 DIAGNOSIS — E039 Hypothyroidism, unspecified: Secondary | ICD-10-CM | POA: Diagnosis not present

## 2022-11-05 DIAGNOSIS — E538 Deficiency of other specified B group vitamins: Secondary | ICD-10-CM | POA: Diagnosis not present

## 2022-11-05 DIAGNOSIS — E1169 Type 2 diabetes mellitus with other specified complication: Secondary | ICD-10-CM

## 2022-11-05 DIAGNOSIS — E1129 Type 2 diabetes mellitus with other diabetic kidney complication: Secondary | ICD-10-CM

## 2022-11-05 DIAGNOSIS — E785 Hyperlipidemia, unspecified: Secondary | ICD-10-CM

## 2022-11-05 DIAGNOSIS — R809 Proteinuria, unspecified: Secondary | ICD-10-CM

## 2022-11-05 DIAGNOSIS — N1831 Chronic kidney disease, stage 3a: Secondary | ICD-10-CM | POA: Diagnosis not present

## 2022-11-05 DIAGNOSIS — E1159 Type 2 diabetes mellitus with other circulatory complications: Secondary | ICD-10-CM

## 2022-11-05 DIAGNOSIS — Z0000 Encounter for general adult medical examination without abnormal findings: Secondary | ICD-10-CM | POA: Diagnosis not present

## 2022-11-05 LAB — MICROSCOPIC EXAMINATION: Bacteria, UA: NONE SEEN

## 2022-11-05 LAB — URINALYSIS, ROUTINE W REFLEX MICROSCOPIC
Bilirubin, UA: NEGATIVE
Glucose, UA: NEGATIVE
Ketones, UA: NEGATIVE
Nitrite, UA: NEGATIVE
Protein,UA: NEGATIVE
Specific Gravity, UA: 1.01 (ref 1.005–1.030)
Urobilinogen, Ur: 0.2 mg/dL (ref 0.2–1.0)
pH, UA: 7 (ref 5.0–7.5)

## 2022-11-05 MED ORDER — PRAVASTATIN SODIUM 40 MG PO TABS: ORAL_TABLET | ORAL | 1 refills | Status: DC

## 2022-11-05 MED ORDER — LISINOPRIL 10 MG PO TABS: 10.0000 mg | ORAL_TABLET | Freq: Every day | ORAL | 1 refills | Status: DC

## 2022-11-05 MED ORDER — RALOXIFENE HCL 60 MG PO TABS: 60.0000 mg | ORAL_TABLET | Freq: Every day | ORAL | 2 refills | Status: DC

## 2022-11-05 MED ORDER — SEMAGLUTIDE(0.25 OR 0.5MG/DOS) 2 MG/3ML ~~LOC~~ SOPN: 0.5000 mg | PEN_INJECTOR | SUBCUTANEOUS | 1 refills | Status: DC

## 2022-11-05 MED ORDER — LEVOTHYROXINE SODIUM 75 MCG PO TABS: ORAL_TABLET | ORAL | 1 refills | Status: DC

## 2022-11-05 MED ORDER — METFORMIN HCL ER 500 MG PO TB24: ORAL_TABLET | ORAL | 1 refills | Status: DC

## 2022-11-05 MED ORDER — PIOGLITAZONE HCL 30 MG PO TABS: ORAL_TABLET | ORAL | 1 refills | Status: DC

## 2022-11-05 MED ORDER — GLYBURIDE 5 MG PO TABS: 5.0000 mg | ORAL_TABLET | Freq: Every day | ORAL | 1 refills | Status: DC

## 2022-11-05 NOTE — Assessment & Plan Note (Signed)
Labs ordered at visit today.  Will make recommendations based on lab results.   

## 2022-11-05 NOTE — Progress Notes (Unsigned)
BP 123/74 (BP Location: Left Arm, Patient Position: Sitting, Cuff Size: Normal)   Pulse 63   Temp 97.9 F (36.6 C) (Oral)   Ht 5' 2.5" (1.588 m)   Wt 158 lb 12.8 oz (72 kg)   LMP  (LMP Unknown)   SpO2 99%   BMI 28.58 kg/m    Subjective:    Patient ID: Emma Berry, female    DOB: [birth year], 72 y.o.   MRN: [id number]  HPI: ZENI MIGUES is a 72 y.o. female presenting on 11/05/2022 for comprehensive medical examination. Current medical complaints include:none  She currently lives with: Menopausal Symptoms: no  HYPERTENSION / HYPERLIPIDEMIA Satisfied with current treatment? yes Duration of hypertension: years BP monitoring frequency: daily BP range: 110/60 BP medication side effects: no Past BP meds:lisinopril Duration of hyperlipidemia: years Cholesterol medication side effects: no Cholesterol supplements: none Past cholesterol medications: pravastatin (pravachol) Medication compliance: excellent compliance Aspirin: no Recent stressors: no Recurrent headaches: no Visual changes: no Palpitations: no Dyspnea: no Chest pain: no Lower extremity edema: yes- at the end of day Dizzy/lightheaded: no  HYPOTHYROIDISM Thyroid control status:controlled Satisfied with current treatment? yes Medication side effects: no Medication compliance: excellent compliance Etiology of hypothyroidism:  Recent dose adjustment:no Fatigue: no Cold intolerance: no Heat intolerance: no Weight gain: no Weight loss: no Constipation: no Diarrhea/loose stools: no Palpitations: no Lower extremity edema: no Anxiety/depressed mood: no  CHRONIC KIDNEY DISEASE CKD status: controlled Medications renally dose: no Previous renal evaluation: no Pneumovax:  Up to Date Influenza Vaccine:  Up to Date  DIABETES Patient states she is only taking the Gabapentin at bedtime.   Hypoglycemic episodes:no Polydipsia/polyuria: no Visual disturbance: no Chest pain: no Paresthesias: no Glucose  Monitoring: no  Accucheck frequency: Daily  Fasting glucose: 70-90  Post prandial:  Evening:  Before meals: Taking Insulin?: no  Long acting insulin:  Short acting insulin: Blood Pressure Monitoring: daily Retinal Examination: Has it scheduled for December Foot Exam: Up to Date Diabetic Education: Not Completed Pneumovax: Up to Date Influenza: Up to Date Aspirin: yes   Depression Screen done today and results listed below:     11/05/2022    8:51 AM 06/23/2022   11:28 AM 05/06/2022    8:54 AM 04/20/2022   11:34 AM 11/04/2021    8:44 AM  Depression screen PHQ 2/9  Decreased Interest 0 0 0 0 0  Down, Depressed, Hopeless 0 0 0 0 0  PHQ - 2 Score 0 0 0 0 0  Altered sleeping 0 0 0 0 0  Tired, decreased energy 0 0 0 0 0  Change in appetite 0 0 0 0 0  Feeling bad or failure about yourself  0 0 0 0 0  Trouble concentrating 0 0 0 0 0  Moving slowly or fidgety/restless 0 0 0 0 0  Suicidal thoughts 0 0 0 0 0  PHQ-9 Score 0 0 0 0 0  Difficult Berry work/chores  Not difficult at all Not difficult at all Not difficult at all Not difficult at all    The patient does not have a history of falls. I did complete a risk assessment for falls. A plan of care for falls was documented.   Past Medical History:  Past Medical History:  Diagnosis Date   Chronic kidney disease    Diabetes mellitus without complication (HCC)    Hyperlipidemia    Hypertension    Hypothyroidism    Osteopenia    Osteoporosis    Thyroid disease  Surgical History:  Past Surgical History:  Procedure Laterality Date   CESAREAN SECTION     COLONOSCOPY WITH PROPOFOL N/A 05/03/2017   Procedure: COLONOSCOPY WITH PROPOFOL;  Surgeon: [name], MD;  Location: [facility] ENDOSCOPY;  Service: Endoscopy;  Laterality: N/A;   VENTRAL HERNIA REPAIR N/A 10/31/2017   Procedure: LAPAROSCOPIC VENTRAL HERNIA;  Surgeon: Carolan Shiver, MD;  Location: [facility] ORS;  Service: General;  Laterality: N/A;   VENTRAL HERNIA  REPAIR N/A 10/31/2017   Procedure: HERNIA REPAIR VENTRAL ADULT;  Surgeon: Carolan Shiver, MD;  Location: [facility] ORS;  Service: General;  Laterality: N/A;    Medications:  Current Outpatient Medications on File Prior to Visit  Medication Sig   aspirin EC 81 MG tablet Take 81 mg by mouth at bedtime.   calcium carbonate (OS-CAL) 600 MG TABS tablet Take 600 mg by mouth at bedtime.   Cholecalciferol (VITAMIN D3) 5000 units CAPS Take 1 capsule by mouth daily.   ferrous gluconate (FERGON) 225 (27 Fe) MG tablet Take 240 mg by mouth daily.   gabapentin (NEURONTIN) 300 MG capsule TAKE ONE CAPSULE BY MOUTH THREE TIMES DAILY   ONE TOUCH ULTRA TEST test strip CHECK BLOOD SUGAR ONCE DAILY   vitamin B-12 (CYANOCOBALAMIN) 1000 MCG tablet Take 1,000 mcg by mouth at bedtime.   No current facility-administered medications on file prior to visit.    Allergies:  No Known Allergies  Social History:  Social History   Socioeconomic History   Marital status: Married    Spouse name: Not on file   Number of children: Not on file   Years of education: Not on file   Highest education level: Not on file  Occupational History   Occupation: retired  Tobacco Use   Smoking status: Never   Smokeless tobacco: Never  Vaping Use   Vaping status: Never Used  Substance and Sexual Activity   Alcohol use: No    Alcohol/week: 0.0 standard drinks of alcohol   Drug use: No   Sexual activity: Yes  Other Topics Concern   Not on file  Social History Narrative   Not on file   Social Determinants of Health   Financial Resource Strain: Low Risk  (04/20/2022)   Overall Financial Resource Strain (CARDIA)    Difficulty of Paying Living Expenses: Not hard at all  Food Insecurity: No Food Insecurity (04/20/2022)   Hunger Vital Sign    Worried About Running Out of Food in the Last Year: Never true    Ran Out of Food in the Last Year: Never true  Transportation Needs: No Transportation Needs (04/20/2022)   PRAPARE -  Administrator, Civil Service (Medical): No    Lack of Transportation (Non-Medical): No  Physical Activity: Insufficiently Active (04/20/2022)   Exercise Vital Sign    Days of Exercise per Week: 3 days    Minutes of Exercise per Session: 30 min  Stress: No Stress Concern Present (04/20/2022)   [employer] of Occupational Health - Occupational Stress Questionnaire    Feeling of Stress : Not at all  Social Connections: Socially Integrated (04/20/2022)   Social Connection and Isolation Panel [NHANES]    Frequency of Communication with Friends and Family: More than three times a week    Frequency of Social Gatherings with Friends and Family: More than three times a week    Attends Religious Services: More than 4 times per year    Active Member of [employer] or Organizations: Yes    Attends [employer]  or Organization Meetings: More than 4 times per year    Marital Status: Married  Catering manager Violence: Not At Risk (04/20/2022)   Humiliation, Afraid, Rape, and Kick questionnaire    Fear of Current or Ex-Partner: No    Emotionally Abused: No    Physically Abused: No    Sexually Abused: No   Social History   Tobacco Use  Smoking Status Never  Smokeless Tobacco Never   Social History   Substance and Sexual Activity  Alcohol Use No   Alcohol/week: 0.0 standard drinks of alcohol    Family History:  Family History  Problem Relation Age of Onset   Cancer Mother 52       melanoma   Diabetes Mother    Thyroid disease Mother    Cancer Father 50       prostate to bone   Diabetes Daughter    Heart disease Maternal Grandfather    Stroke Paternal Grandmother    Stroke Paternal Grandfather    Breast cancer Neg Hx     Past medical history, surgical history, medications, allergies, family history and social history reviewed with patient today and changes made to appropriate areas of the chart.   Review of Systems  Constitutional:  Negative for malaise/fatigue and weight loss.   HENT:         Denies vision changes.  Eyes:  Negative for blurred vision and double vision.  Respiratory:  Negative for shortness of breath.   Cardiovascular:  Negative for chest pain, palpitations and leg swelling.  Gastrointestinal:  Negative for constipation and diarrhea.  Neurological:  Negative for dizziness, tingling and headaches.  Endo/Heme/Allergies:  Negative for polydipsia.       Denies Polyuria  Psychiatric/Behavioral:  Negative for depression. The patient is not nervous/anxious.    All other ROS negative except what is listed above and in the HPI.      Objective:    BP 123/74 (BP Location: Left Arm, Patient Position: Sitting, Cuff Size: Normal)   Pulse 63   Temp 97.9 F (36.6 C) (Oral)   Ht 5' 2.5" (1.588 m)   Wt 158 lb 12.8 oz (72 kg)   LMP  (LMP Unknown)   SpO2 99%   BMI 28.58 kg/m   Wt Readings from Last 3 Encounters:  11/05/22 158 lb 12.8 oz (72 kg)  06/23/22 152 lb 3.2 oz (69 kg)  05/06/22 157 lb 12.8 oz (71.6 kg)    Physical Exam Vitals and nursing note reviewed.  Constitutional:      General: She is awake. She is not in acute distress.    Appearance: Normal appearance. She is well-developed. She is not ill-appearing.  HENT:     Head: Normocephalic and atraumatic.     Right Ear: Hearing, tympanic membrane, ear canal and external ear normal. No drainage.     Left Ear: Hearing, tympanic membrane, ear canal and external ear normal. No drainage.     Nose: Nose normal.     Right Sinus: No maxillary sinus tenderness or frontal sinus tenderness.     Left Sinus: No maxillary sinus tenderness or frontal sinus tenderness.     Mouth/Throat:     Mouth: Mucous membranes are moist.     Pharynx: Oropharynx is clear. Uvula midline. No pharyngeal swelling, oropharyngeal exudate or posterior oropharyngeal erythema.  Eyes:     General: Lids are normal.        Right eye: No discharge.        Left eye:  No discharge.     Extraocular Movements: Extraocular movements  intact.     Conjunctiva/sclera: Conjunctivae normal.     Pupils: Pupils are equal, round, and reactive to light.     Visual Fields: Right eye visual fields normal and left eye visual fields normal.  Neck:     Thyroid: No thyromegaly.     Vascular: No carotid bruit.     Trachea: Trachea normal.  Cardiovascular:     Rate and Rhythm: Normal rate and regular rhythm.     Heart sounds: Normal heart sounds. No murmur heard.    No gallop.  Pulmonary:     Effort: Pulmonary effort is normal. No accessory muscle usage or respiratory distress.     Breath sounds: Normal breath sounds.  Chest:  Breasts:    Right: Normal.     Left: Normal.  Abdominal:     General: Bowel sounds are normal.     Palpations: Abdomen is soft. There is no hepatomegaly or splenomegaly.     Tenderness: There is no abdominal tenderness.  Musculoskeletal:        General: Normal range of motion.     Cervical back: Normal range of motion and neck supple.     Right lower leg: No edema.     Left lower leg: No edema.  Lymphadenopathy:     Head:     Right side of head: No submental, submandibular, tonsillar, preauricular or posterior auricular adenopathy.     Left side of head: No submental, submandibular, tonsillar, preauricular or posterior auricular adenopathy.     Cervical: No cervical adenopathy.     Upper Body:     Right upper body: No supraclavicular, axillary or pectoral adenopathy.     Left upper body: No supraclavicular, axillary or pectoral adenopathy.  Skin:    General: Skin is warm and dry.     Capillary Refill: Capillary refill takes less than 2 seconds.     Findings: No rash.  Neurological:     Mental Status: She is alert and oriented to person, place, and time.     Gait: Gait is intact.     Deep Tendon Reflexes: Reflexes are normal and symmetric.     Reflex Scores:      Brachioradialis reflexes are 2+ on the right side and 2+ on the left side.      Patellar reflexes are 2+ on the right side and 2+ on  the left side. Psychiatric:        Attention and Perception: Attention normal.        Mood and Affect: Mood normal.        Speech: Speech normal.        Behavior: Behavior normal. Behavior is cooperative.        Thought Content: Thought content normal.        Judgment: Judgment normal.     Results for orders placed or performed in visit on 06/23/22  Rapid Strep screen([facility])   Specimen: Other   Other  Result Value Ref Range   Strep Gp A Ag, IA W/Reflex Negative Negative  Novel Coronavirus, NAA ([facility])   Specimen: Nasopharyngeal(NP) swabs in vial transport medium  Result Value Ref Range   SARS-CoV-2, NAA Not Detected Not Detected  Culture, Group A Strep   Other  Result Value Ref Range   Strep A Culture Negative       Assessment & Plan:   Problem List Items Addressed This Visit  Cardiovascular and Mediastinum   Hypertension associated with diabetes (HCC)    Chronic.  Controlled.  Continue with current medication regimen of Lisinopril 10mg .  Refills sent today.  Labs ordered today.  Return to clinic in 6 months for reevaluation.  Call sooner if concerns arise.        Relevant Medications   Semaglutide,0.25 or 0.5MG /DOS, 2 MG/3ML SOPN   glyBURIDE (DIABETA) 5 MG tablet   lisinopril (ZESTRIL) 10 MG tablet   metFORMIN (GLUCOPHAGE-XR) 500 MG 24 hr tablet   pioglitazone (ACTOS) 30 MG tablet   pravastatin (PRAVACHOL) 40 MG tablet   Aortic atherosclerosis (HCC)    Noted on CT imaging 07/01/2017.  Continue pravastatin and ASA daily for prevention + good diabetes control.  Continue with Pravastatin.      Relevant Medications   lisinopril (ZESTRIL) 10 MG tablet   pravastatin (PRAVACHOL) 40 MG tablet     Endocrine   Type 2 diabetes mellitus with proteinuria (HCC)    Chronic.  Controlled at 6.4%.  Her insurance will no longer cover Victoza.  Will switch from Vitctoza to Ozempic.  Reviewed medication with patient during visit.  Discussed how to give medication.   Will start at 0.5mg  weekly since she has been on victoza.  Continue with current medication regimen on Metformin, Actos, and Glyburide. Refills sent today.   Sugars running 60-80 fasting.  Labs ordered today.  Return to clinic in 3 months for reevaluation.  Call sooner if concerns arise.        Relevant Medications   Semaglutide,0.25 or 0.5MG /DOS, 2 MG/3ML SOPN   glyBURIDE (DIABETA) 5 MG tablet   lisinopril (ZESTRIL) 10 MG tablet   metFORMIN (GLUCOPHAGE-XR) 500 MG 24 hr tablet   pioglitazone (ACTOS) 30 MG tablet   pravastatin (PRAVACHOL) 40 MG tablet   Other Relevant Orders   HgB A1c   Hyperlipidemia associated with type 2 diabetes mellitus (HCC)    Chronic.  Controlled.  Continue with current medication regimen on Pravastatin 40mg .  Refills sent today.  Labs ordered today.  Return to clinic in 6 months for reevaluation.  Call sooner if concerns arise.        Relevant Medications   Semaglutide,0.25 or 0.5MG /DOS, 2 MG/3ML SOPN   glyBURIDE (DIABETA) 5 MG tablet   lisinopril (ZESTRIL) 10 MG tablet   metFORMIN (GLUCOPHAGE-XR) 500 MG 24 hr tablet   pioglitazone (ACTOS) 30 MG tablet   pravastatin (PRAVACHOL) 40 MG tablet   Other Relevant Orders   Lipid panel   Hypothyroidism    Chronic. Will make recommendations based on lab results. Continue with current dose of Levothyroxine.  Refills sent today.  Follow up in 6 months.  Call sooner if concerns arise.       Relevant Medications   levothyroxine (SYNTHROID) 75 MCG tablet   Other Relevant Orders   TSH   T4, free     Genitourinary   CKD (chronic kidney disease) stage 3, GFR 30-59 ml/min (HCC)    Chronic.  Stable.  On ACE and GLP1.  Labs ordered at visit today.  Will make recommendations based on lab results.          Other   B12 deficiency    Labs ordered at visit today.  Will make recommendations based on lab results.        Relevant Orders   B12   Other Visit Diagnoses     Flu vaccine need    -  Primary   Relevant  Orders  Flu Vaccine Trivalent High Dose (Fluad)   Annual physical exam       Relevant Orders   CBC with Differential/Platelet   Comprehensive metabolic panel   Lipid panel   TSH   Urinalysis, Routine w reflex microscopic   HgB A1c   T4, free   B12        Follow up plan: Return in about 3 months (around 02/05/2023) for HTN, HLD, DM2 FU.   LABORATORY TESTING:  - Pap smear: not applicable  IMMUNIZATIONS:   - Tdap: Tetanus vaccination status reviewed: last tetanus booster within 10 years. - Influenza: Postponed to flu season - Pneumovax: Up to date - Prevnar: Up to date - COVID: Up to date - HPV: Not applicable - Shingrix vaccine:  Discussed at visit today  SCREENING: -Mammogram: Not applicable  - Colonoscopy: Up to date  - Bone Density: Not applicable  -Hearing Test: Not applicable  -Spirometry: Not applicable   PATIENT COUNSELING:   Advised to take 1 mg of folate supplement per day if capable of pregnancy.   Sexuality: Discussed sexually transmitted diseases, partner selection, use of condoms, avoidance of unintended pregnancy  and contraceptive alternatives.   Advised to avoid cigarette smoking.  I discussed with the patient that most people either abstain from alcohol or drink within safe limits (<=14/week and <=4 drinks/occasion for males, <=7/weeks and <= 3 drinks/occasion for females) and that the risk for alcohol disorders and other health effects rises proportionally with the number of drinks per week and how often a drinker exceeds daily limits.  Discussed cessation/primary prevention of drug use and availability of treatment for abuse.   Diet: Encouraged to adjust caloric intake to maintain  or achieve ideal body weight, to reduce intake of dietary saturated fat and total fat, to limit sodium intake by avoiding high sodium foods and not adding table salt, and to maintain adequate dietary potassium and calcium preferably from fresh fruits, vegetables, and  low-fat dairy products.    stressed the importance of regular exercise  Injury prevention: Discussed safety belts, safety helmets, smoke detector, smoking near bedding or upholstery.   Dental health: Discussed importance of regular tooth brushing, flossing, and dental visits.    NEXT PREVENTATIVE PHYSICAL DUE IN 1 YEAR. Return in about 3 months (around 02/05/2023) for HTN, HLD, DM2 FU.

## 2022-11-05 NOTE — Assessment & Plan Note (Signed)
Chronic. Will make recommendations based on lab results. Continue with current dose of Levothyroxine.  Refills sent today.  Follow up in 6 months.  Call sooner if concerns arise.

## 2022-11-05 NOTE — Assessment & Plan Note (Signed)
Chronic.  Controlled at 6.4%.  Her insurance will no longer cover Victoza.  Will switch from Vitctoza to Ozempic.  Reviewed medication with patient during visit.  Discussed how to give medication.  Will start at 0.5mg  weekly since she has been on victoza.  Continue with current medication regimen on Metformin, Actos, and Glyburide. Refills sent today.   Sugars running 60-80 fasting.  Labs ordered today.  Return to clinic in 3 months for reevaluation.  Call sooner if concerns arise.

## 2022-11-05 NOTE — Assessment & Plan Note (Signed)
Chronic.  Controlled.  Continue with current medication regimen on Pravastatin .  Refills sent today.  Labs ordered today.  Return to clinic in 6 months for reevaluation.  Call sooner if concerns arise.

## 2022-11-05 NOTE — Assessment & Plan Note (Signed)
Chronic.  Stable.  On ACE and GLP1.  Labs ordered at visit today.  Will make recommendations based on lab results.

## 2022-11-05 NOTE — Assessment & Plan Note (Signed)
Noted on CT imaging 07/01/2017.  Continue pravastatin and ASA daily for prevention + good diabetes control.  Continue with Pravastatin.

## 2022-11-05 NOTE — Assessment & Plan Note (Signed)
Chronic.  Controlled.  Continue with current medication regimen of Lisinopril 10mg .  Refills sent today. Labs ordered today.  Return to clinic in 6 months for reevaluation.  Call sooner if concerns arise.

## 2022-11-05 NOTE — Telephone Encounter (Signed)
Requested Prescriptions  Refused Prescriptions Disp Refills   lisinopril (ZESTRIL) 10 MG tablet [Pharmacy Med Name: lisinopril 10 mg tablet] 90 tablet 1    Sig: TAKE ONE TABLET BY MOUTH ONCE DAILY     Cardiovascular:  ACE Inhibitors Failed - 11/04/2022  8:01 AM      Failed - Cr in normal range and within 180 days    Creatinine  Date Value Ref Range Status  02/23/2011 0.88 0.60 - 1.30 mg/dL Final   Creatinine, Ser  Date Value Ref Range Status  05/06/2022 1.08 (H) 0.57 - 1.00 mg/dL Final         Failed - K in normal range and within 180 days    Potassium  Date Value Ref Range Status  05/06/2022 4.8 3.5 - 5.2 mmol/L Final         Passed - Patient is not pregnant      Passed - Last BP in normal range    BP Readings from Last 1 Encounters:  11/05/22 123/74         Passed - Valid encounter within last 6 months    Recent Outpatient Visits           Today Flu vaccine need   [place] [facility] Larae Grooms, NP   4 months ago Acute upper respiratory infection   [place] [facility] Lar[facility], NP   6 months ago Hypertension associated with diabetes [facility])   [place] [facility] Larae Grooms, NP   1 year ago Hypertension associated with diabetes [facility])   [place] [facility] Larae Grooms, NP   1 year ago Annual physical exam   [place] [facility] Larae Grooms, NP       Future Appointments             In 3 months Larae Grooms, NP [place] [facility]             pioglitazone (ACTOS) 30 MG tablet [Pharmacy Med Name: pioglitazone 30 mg tablet] 90 tablet 1    Sig: TAKE ONE TABLET BY MOUTH ONCE DAILY     Endocrinology:  Diabetes - Glitazones - pioglitazone Failed - 11/04/2022  8:01 AM      Failed - HBA1C is between 0 and 7.9 and within 180 days    Hemoglobin A1C  Date Value Ref Range Status  11/24/2015 7.2%  Final   HB A1C (BAYER DCA  - WAIVED)  Date Value Ref Range Status  12/26/2019 6.2 <7.0 % Final    Comment:                                          Diabetic Adult            <7.0                                       Healthy Adult        4.3 - 5.7                                                           (  DCCT/NGSP) American Diabetes Association's Summary of Glycemic Recommendations for Adults with Diabetes: Hemoglobin A1c <7.0%. More stringent glycemic goals (A1c <6.0%) may further reduce complications at the cost of increased risk of hypoglycemia.    Hgb A1c MFr Bld  Date Value Ref Range Status  05/06/2022 6.8 (H) 4.8 - 5.6 % Final    Comment:             Prediabetes: 5.7 - 6.4          Diabetes: >6.4          Glycemic control for adults with diabetes: <7.0          Passed - Valid encounter within last 6 months    Recent Outpatient Visits           Today Flu vaccine need   [place] [facility] Lar[facility], NP   4 months ago Acute upper respiratory infection   [place] [facility] Larae Grooms, NP   6 months ago Hypertension associated with diabetes [facility])   [place] [facility] Lar[facility] G[facility], NP   1 year ago Hypertension associated with diabetes [facility])   [place] [facility] Larae Grooms, NP   1 year ago Annual physical exam   [place] [facility] Larae Grooms, NP       Future Appointments             In 3 months Larae Grooms, NP [place] [facility]             pravastatin (PRAVACHOL) 40 MG tablet [Pharmacy Med Name: pravastatin 40 mg tablet] 90 tablet 1    Sig: TAKE ONE TABLET BY MOUTH AT BEDTIME     Cardiovascular:  Antilipid - Statins Failed - 11/04/2022  8:01 AM      Failed - Lipid Panel in normal range within the last 12 months    Cholesterol, Total  Date Value Ref Range Status  05/06/2022 128 100 - 199 mg/dL Final   Cholesterol Piccolo, Waived   Date Value Ref Range Status  05/25/2016 121 <200 mg/dL Final    Comment:                            Desirable                <200                         Borderline High      200- 239                         High                     >239    LDL Chol Calc (NIH)  Date Value Ref Range Status  05/06/2022 59 0 - 99 mg/dL Final   HDL  Date Value Ref Range Status  05/06/2022 55 >39 mg/dL Final   Triglycerides  Date Value Ref Range Status  05/06/2022 66 0 - 149 mg/dL Final   Triglycerides Piccolo,Waived  Date Value Ref Range Status  05/25/2016 69 <150 mg/dL Final    Comment:                            Normal                   <  150                         Borderline High     150 - 199                         High                200 - 499                         Very High                >499          Passed - Patient is not pregnant      Passed - Valid encounter within last 12 months    Recent Outpatient Visits           Today Flu vaccine need   [place] [facility] Larae Grooms, NP   4 months ago Acute upper respiratory infection   [place] [facility]   6 months ago Hypertension associated with diabetes [facility])   [place] [facility] Larae Grooms, NP   1 year ago Hypertension associated with diabetes [facility])   [place] [facility] Larae Grooms, NP   1 year ago Annual physical exam   [place] [facility] Larae Grooms, NP       Future Appointments             In 3 months Larae Grooms, NP [place] [facility]             raloxifene (EVISTA) 60 MG tablet [Pharmacy Med Name: raloxifene 60 mg tablet] 90 tablet 2    Sig: TAKE ONE TABLET BY MOUTH ONCE DAILY     OB/GYN: Selective Estrogen Receptor Modulators 2 Passed - 11/04/2022  8:01 AM      Passed - Ca in normal range and within 360 days    Calcium  Date Value Ref Range  Status  05/06/2022 9.6 8.7 - 10.3 mg/dL Final         Passed - Valid encounter within last 12 months    Recent Outpatient Visits           Today Flu vaccine need   [place] [facility] Lar[facility] G[facility], NP   4 months ago Acute upper respiratory infection   [place] [facility] Larae G[facility], NP   6 months ago Hypertension associated with diabetes [facility])   [place] [facility] Larae Grooms, NP   1 year ago Hypertension associated with diabetes [facility])   [place] [facility], NP   1 year ago Annual physical exam   [place] [facility] Lar[facility] G[facility], NP       Future Appointments             In 3 months Larae Grooms, NP [place] [facility]            Passed - Bone Mineral Density or Dexa Scan completed in the last 2 years       metFORMIN (GLUCOPHAGE-XR) 500 MG 24 hr tablet [Pharmacy Med Name: metformin ER 500 mg tablet,extended release 24 hr] 360 tablet 1    Sig: TAKE TWO TABLETS BY MOUTH TWICE DAILY morning AND bedtime     Endocrinology:  Diabetes - Biguanides Failed - 11/04/2022  8:01 AM      Failed - Cr in normal range and within 360 days    Creatinine  Date Value Ref Range Status  02/23/2011 0.88 0.60 - 1.30 mg/dL Final   Creatinine, Ser  Date Value Ref Range Status  05/06/2022 1.08 (H) 0.57 - 1.00 mg/dL Final         Failed - HBA1C is between 0 and 7.9 and within 180 days    Hemoglobin A1C  Date Value Ref Range Status  11/24/2015 7.2%  Final   HB A1C (BAYER DCA - WAIVED)  Date Value Ref Range Status  12/26/2019 6.2 <7.0 % Final    Comment:                                          Diabetic Adult            <7.0                                       Healthy Adult        4.3 - 5.7                                                           (DCCT/NGSP) American Diabetes Association's Summary of Glycemic Recommendations for  Adults with Diabetes: Hemoglobin A1c <7.0%. More stringent glycemic goals (A1c <6.0%) may further reduce complications at the cost of increased risk of hypoglycemia.    Hgb A1c MFr Bld  Date Value Ref Range Status  05/06/2022 6.8 (H) 4.8 - 5.6 % Final    Comment:             Prediabetes: 5.7 - 6.4          Diabetes: >6.4          Glycemic control for adults with diabetes: <7.0          Failed - eGFR in normal range and within 360 days    EGFR (African American)  Date Value Ref Range Status  02/23/2011 >60 >34mL/min Final   GFR calc Af Amer  Date Value Ref Range Status  12/26/2019 64 >59 mL/min/1.73 Final    Comment:    **In accordance with recommendations from the NKF-ASN Task force,**   [facility] is in the process of updating its eGFR calculation to the   2021 CKD-EPI creatinine equation that estimates kidney function   without a race variable.    EGFR (Non-African Amer.)  Date Value Ref Range Status  02/23/2011 >60 >50mL/min Final    Comment:    eGFR values <48mL/min/1.73 m2 may be an indication of chronic kidney disease (CKD). Calculated eGFR, using the MRDR Study equation, is useful in  patients with stable renal function. The eGFR calculation will not be reliable in acutely ill patients when serum creatinine is changing rapidly. It is not useful in patients on dialysis. The eGFR calculation may not be applicable to patients at the low and high extremes of body sizes, pregnant women, and vegetarians.    GFR calc non Af Amer  Date Value Ref Range  Status  12/26/2019 55 (L) >59 mL/min/1.73 Final   eGFR  Date Value Ref Range Status  05/06/2022 54 (L) >59 mL/min/1.73 Final         Failed - B12 Level in normal range and within 720 days    Vitamin B-12  Date Value Ref Range Status  05/06/2022 1,293 (H) 232 - 1,245 pg/mL Final         Failed - CBC within normal limits and completed in the last 12 months    WBC  Date Value Ref Range Status  05/05/2021 5.8 3.4 -  10.8 x10E3/uL Final  02/23/2011 5.2 3.6 - 11.0 x10 3/mm  Final   RBC  Date Value Ref Range Status  05/05/2021 3.39 (L) 3.77 - 5.28 x10E6/uL Final    Comment:    CBC results reported were obtained after the specimen had been warmed to 37 degrees C.  This may indicate the presence of Cold Agglutinins.   02/23/2011 3.52 (L) 3.80 - 5.20 x10 6/mm  Final   Hemoglobin  Date Value Ref Range Status  05/05/2021 11.2 11.1 - 15.9 g/dL Final   Hematocrit  Date Value Ref Range Status  05/05/2021 33.9 (L) 34.0 - 46.6 % Final   MCHC  Date Value Ref Range Status  05/05/2021 33.0 31.5 - 35.7 g/dL Final  40/98/1191 47.8 32.0 - 36.0 g/dL Final   [facility]  Date Value Ref Range Status  05/05/2021 33.0 26.6 - 33.0 pg Final  02/23/2011 34.6 (H) 26.0 - 34.0 pg Final   MCV  Date Value Ref Range Status  05/05/2021 100 (H) 79 - 97 fL Final  02/23/2011 100 80 - 100 fL Final   No results found for: "PLTCOUNTKUC", "LABPLAT", "POCPLA" RDW  Date Value Ref Range Status  05/05/2021 12.4 11.7 - 15.4 % Final  02/23/2011 13.8 11.5 - 14.5 % Final         Passed - Valid encounter within last 6 months    Recent Outpatient Visits           Today Flu vaccine need   [place] [facility] Larae Grooms, NP   4 months ago Acute upper respiratory infection   [place] [facility] Lar[facility], NP   6 months ago Hypertension associated with diabetes [facility])   [place] [facility], NP   1 year ago Hypertension associated with diabetes [facility])   [place] [facility] Larae Grooms, NP   1 year ago Annual physical exam   [place] [facility] Larae Grooms, NP       Future Appointments             In 3 months Larae Grooms, NP  [place] [facility]

## 2022-11-06 LAB — COMPREHENSIVE METABOLIC PANEL
ALT: 10 [IU]/L (ref 0–32)
AST: 20 [IU]/L (ref 0–40)
Albumin: 4 g/dL (ref 3.8–4.8)
Alkaline Phosphatase: 63 [IU]/L (ref 44–121)
BUN/Creatinine Ratio: 20 (ref 12–28)
BUN: 24 mg/dL (ref 8–27)
Bilirubin Total: 0.3 mg/dL (ref 0.0–1.2)
CO2: 23 mmol/L (ref 20–29)
Calcium: 9.5 mg/dL (ref 8.7–10.3)
Chloride: 104 mmol/L (ref 96–106)
Creatinine, Ser: 1.19 mg/dL — ABNORMAL HIGH (ref 0.57–1.00)
Globulin, Total: 2.7 g/dL (ref 1.5–4.5)
Glucose: 74 mg/dL (ref 70–99)
Potassium: 4.9 mmol/L (ref 3.5–5.2)
Sodium: 141 mmol/L (ref 134–144)
Total Protein: 6.7 g/dL (ref 6.0–8.5)
eGFR: 48 mL/min/{1.73_m2} — ABNORMAL LOW (ref >59)

## 2022-11-06 LAB — CBC WITH DIFFERENTIAL/PLATELET
Basophils Absolute: 0 10*3/uL (ref 0.0–0.2)
Basos: 1 %
EOS (ABSOLUTE): 0.1 10*3/uL (ref 0.0–0.4)
Eos: 2 %
Hematocrit: 33.2 % — ABNORMAL LOW (ref 34.0–46.6)
Hemoglobin: 10.7 g/dL — ABNORMAL LOW (ref 11.1–15.9)
Immature Grans (Abs): 0 10*3/uL (ref 0.0–0.1)
Immature Granulocytes: 0 %
Lymphocytes Absolute: 1.2 10*3/uL (ref 0.7–3.1)
Lymphs: 24 %
MCH: 33.6 pg — ABNORMAL HIGH (ref 26.6–33.0)
MCHC: 32.2 g/dL (ref 31.5–35.7)
MCV: 104 fL — ABNORMAL HIGH (ref 79–97)
Monocytes Absolute: 0.6 10*3/uL (ref 0.1–0.9)
Monocytes: 11 %
Neutrophils Absolute: 3.2 10*3/uL (ref 1.4–7.0)
Neutrophils: 62 %
Platelets: 253 10*3/uL (ref 150–450)
RBC: 3.18 x10E6/uL — ABNORMAL LOW (ref 3.77–5.28)
RDW: 12.8 % (ref 11.7–15.4)
WBC: 5.1 10*3/uL (ref 3.4–10.8)

## 2022-11-06 LAB — HEMOGLOBIN A1C
Est. average glucose Bld gHb Est-mCnc: 143 mg/dL
Hgb A1c MFr Bld: 6.6 % — ABNORMAL HIGH (ref 4.8–5.6)

## 2022-11-06 LAB — TSH: TSH: 1.67 u[IU]/mL (ref 0.450–4.500)

## 2022-11-06 LAB — LIPID PANEL
Chol/HDL Ratio: 2.5 ratio (ref 0.0–4.4)
Cholesterol, Total: 129 mg/dL (ref 100–199)
HDL: 51 mg/dL (ref >39)
LDL Chol Calc (NIH): 63 mg/dL (ref 0–99)
Triglycerides: 75 mg/dL (ref 0–149)
VLDL Cholesterol Cal: 15 mg/dL (ref 5–40)

## 2022-11-06 LAB — VITAMIN B12: Vitamin B-12: 1006 pg/mL (ref 232–1245)

## 2022-11-06 LAB — T4, FREE: Free T4: 1.72 ng/dL (ref 0.82–1.77)

## 2022-11-08 DIAGNOSIS — Z23 Encounter for immunization: Secondary | ICD-10-CM

## 2022-11-08 DIAGNOSIS — E1169 Type 2 diabetes mellitus with other specified complication: Secondary | ICD-10-CM | POA: Diagnosis not present

## 2022-11-08 DIAGNOSIS — E1129 Type 2 diabetes mellitus with other diabetic kidney complication: Secondary | ICD-10-CM | POA: Diagnosis not present

## 2022-11-08 DIAGNOSIS — E1159 Type 2 diabetes mellitus with other circulatory complications: Secondary | ICD-10-CM | POA: Diagnosis not present

## 2022-11-08 DIAGNOSIS — I700 Atherosclerosis of aorta: Secondary | ICD-10-CM | POA: Diagnosis not present

## 2022-11-08 DIAGNOSIS — E039 Hypothyroidism, unspecified: Secondary | ICD-10-CM | POA: Diagnosis not present

## 2022-11-08 DIAGNOSIS — Z0000 Encounter for general adult medical examination without abnormal findings: Secondary | ICD-10-CM | POA: Diagnosis not present

## 2022-11-08 DIAGNOSIS — R809 Proteinuria, unspecified: Secondary | ICD-10-CM | POA: Diagnosis not present

## 2022-11-08 DIAGNOSIS — N1831 Chronic kidney disease, stage 3a: Secondary | ICD-10-CM | POA: Diagnosis not present

## 2022-11-18 ENCOUNTER — Other Ambulatory Visit: Payer: Self-pay | Admitting: Nurse Practitioner

## 2022-11-18 NOTE — Telephone Encounter (Signed)
Requested Prescriptions  Refused Prescriptions Disp Refills   pravastatin (PRAVACHOL) 40 MG tablet [Pharmacy Med Name: pravastatin 40 mg tablet] 90 tablet 1    Sig: TAKE ONE TABLET BY MOUTH AT BEDTIME     Cardiovascular:  Antilipid - Statins Failed - 11/18/2022  7:52 AM      Failed - Lipid Panel in normal range within the last 12 months    Cholesterol, Total  Date Value Ref Range Status  11/05/2022 129 100 - 199 mg/dL Final   Cholesterol Piccolo, Waived  Date Value Ref Range Status  05/25/2016 121 <200 mg/dL Final    Comment:                            Desirable                <200                         Borderline High      200- 239                         High                     >239    LDL Chol Calc (NIH)  Date Value Ref Range Status  11/05/2022 63 0 - 99 mg/dL Final   HDL  Date Value Ref Range Status  11/05/2022 51 >39 mg/dL Final   Triglycerides  Date Value Ref Range Status  11/05/2022 75 0 - 149 mg/dL Final   Triglycerides Piccolo,Waived  Date Value Ref Range Status  05/25/2016 69 <150 mg/dL Final    Comment:                            Normal                   <150                         Borderline High     150 - 199                         High                200 - 499                         Very High                >499          Passed - Patient is not pregnant      Passed - Valid encounter within last 12 months    Recent Outpatient Visits           1 week ago Flu vaccine need   [place] [facility] Larae [facility], NP   4 months ago Acute upper respiratory infection   [place] [facility], NP   6 months ago Hypertension associated with diabetes [facility])   [place] [facility] Larae Grooms, NP   1 year ago Hypertension associated with diabetes [facility])   [place] [facility] Larae Grooms, NP   1 year ago Annual physical exam   [place] [facility] Larae Grooms, NP       Future Appointments             In 2 months Larae Grooms, NP [place] [facility]             raloxifene (EVISTA) 60 MG tablet [Pharmacy Med Name: raloxifene 60 mg tablet] 90 tablet 1    Sig: TAKE ONE TABLET BY MOUTH ONCE DAILY     OB/GYN: Selective Estrogen Receptor Modulators 2 Passed - 11/18/2022  7:52 AM      Passed - Ca in normal range and within 360 days    Calcium  Date Value Ref Range Status  11/05/2022 9.5 8.7 - 10.3 mg/dL Final         Passed - Valid encounter within last 12 months    Recent Outpatient Visits           1 week ago Flu vaccine need   [place] [facility], NP   4 months ago Acute upper respiratory infection   [place] [facility] Lar[facility] G[facility], NP   6 months ago Hypertension associated with diabetes [facility])   [place] [facility] Larae Grooms, NP   1 year ago Hypertension associated with diabetes [facility])   [place] [facility] Larae Grooms, NP   1 year ago Annual physical exam   [facility] Lar[facility] G[facility], NP       Future Appointments             In 2 months Larae Grooms, NP [place] [facility]            Passed - Bone Mineral Density or Dexa Scan completed in the last 2 years

## 2022-12-29 DIAGNOSIS — H5213 Myopia, bilateral: Secondary | ICD-10-CM | POA: Diagnosis not present

## 2022-12-29 LAB — HM DIABETES EYE EXAM

## 2022-12-30 DIAGNOSIS — Z0100 Encounter for examination of eyes and vision without abnormal findings: Secondary | ICD-10-CM | POA: Diagnosis not present

## 2023-02-01 ENCOUNTER — Other Ambulatory Visit: Payer: Self-pay | Admitting: Nurse Practitioner

## 2023-02-01 NOTE — Telephone Encounter (Signed)
Requested Prescriptions  Refused Prescriptions Disp Refills   levothyroxine (SYNTHROID) 75 MCG tablet [Pharmacy Med Name: levothyroxine 75 mcg tablet] 90 tablet 1    Sig: TAKE ONE TABLET BY MOUTH EVERY MORNING BEFORE BREAKFAST     Endocrinology:  Hypothyroid Agents Passed - 02/01/2023 12:54 PM      Passed - TSH in normal range and within 360 days    TSH  Date Value Ref Range Status  11/05/2022 1.670 0.450 - 4.500 uIU/mL Final         Passed - Valid encounter within last 12 months    Recent Outpatient Visits           2 months ago Flu vaccine need   [place] [facility] Lar[facility], NP   7 months ago Acute upper respiratory infection   [place] [facility], NP   9 months ago Hypertension associated with diabetes [facility])   [place] [facility] Lar[facility] G[facility], NP   1 year ago Hypertension associated with diabetes [facility])   [place] [facility] Larae Grooms, NP   1 year ago Annual physical exam   [place] [facility] Larae Grooms, NP       Future Appointments             In 1 week Larae Grooms, NP [place] [facility]             lisinopril (ZESTRIL) 10 MG tablet [Pharmacy Med Name: lisinopril 10 mg tablet] 90 tablet 1    Sig: TAKE ONE TABLET BY MOUTH ONCE DAILY     Cardiovascular:  ACE Inhibitors Failed - 02/01/2023 12:54 PM      Failed - Cr in normal range and within 180 days    Creatinine  Date Value Ref Range Status  02/23/2011 0.88 0.60 - 1.30 mg/dL Final   Creatinine, Ser  Date Value Ref Range Status  11/05/2022 1.19 (H) 0.57 - 1.00 mg/dL Final         Passed - K in normal range and within 180 days    Potassium  Date Value Ref Range Status  11/05/2022 4.9 3.5 - 5.2 mmol/L Final         Passed - Patient is not pregnant      Passed - Last BP in normal range    BP Readings from Last 1 Encounters:  11/05/22 123/74          Passed - Valid encounter within last 6 months    Recent Outpatient Visits           2 months ago Flu vaccine need   [place] [facility] Larae G[facility], NP   7 months ago Acute upper respiratory infection   [place] [facility] Lar[facility] G[facility], NP   9 months ago Hypertension associated with diabetes [facility])   [place] [facility] Larae Grooms, NP   1 year ago Hypertension associated with diabetes [facility])   [facility] Larae Grooms, NP   1 year ago Annual physical exam   [place] [facility] Larae Grooms, NP       Future Appointments             In 1 week Larae Grooms, NP  [facility]             glyBURIDE (DIABETA) 5 MG tablet [Pharmacy Med Name: glyburide 5 mg tablet] 90  tablet 1    Sig: TAKE ONE TABLET BY MOUTH EVERY MORNING WITH BREAKFAST     Endocrinology:  Diabetes - Sulfonylureas - glyburide Failed - 02/01/2023 12:54 PM      Failed - HBA1C in normal range and within 180 days    Hemoglobin A1C  Date Value Ref Range Status  11/24/2015 7.2%  Final   HB A1C (BAYER DCA - WAIVED)  Date Value Ref Range Status  12/26/2019 6.2 <7.0 % Final    Comment:                                          Diabetic Adult            <7.0                                       Healthy Adult        4.3 - 5.7                                                           (DCCT/NGSP) American Diabetes Association's Summary of Glycemic Recommendations for Adults with Diabetes: Hemoglobin A1c <7.0%. More stringent glycemic goals (A1c <6.0%) may further reduce complications at the cost of increased risk of hypoglycemia.    Hgb A1c MFr Bld  Date Value Ref Range Status  11/05/2022 6.6 (H) 4.8 - 5.6 % Final    Comment:             Prediabetes: 5.7 - 6.4          Diabetes: >6.4          Glycemic control for adults with diabetes: <7.0          Failed  - Cr in normal range and within 360 days    Creatinine  Date Value Ref Range Status  02/23/2011 0.88 0.60 - 1.30 mg/dL Final   Creatinine, Ser  Date Value Ref Range Status  11/05/2022 1.19 (H) 0.57 - 1.00 mg/dL Final         Failed - eGFR is 60 or above and within 360 days    EGFR (African American)  Date Value Ref Range Status  02/23/2011 >60 >64mL/min Final   GFR calc Af Amer  Date Value Ref Range Status  12/26/2019 64 >59 mL/min/1.73 Final    Comment:    **In accordance with recommendations from the NKF-ASN Task force,**   [facility] is in the process of updating its eGFR calculation to the   2021 CKD-EPI creatinine equation that estimates kidney function   without a race variable.    EGFR (Non-African Amer.)  Date Value Ref Range Status  02/23/2011 >60 >92mL/min Final    Comment:    eGFR values <65mL/min/1.73 m2 may be an indication of chronic kidney disease (CKD). Calculated eGFR, using the MRDR Study equation, is useful in  patients with stable renal function. The eGFR calculation will not be reliable in acutely ill patients when serum creatinine is changing rapidly. It is not useful in patients on dialysis. The eGFR calculation may not be applicable to patients at the low and high  extremes of body sizes, pregnant women, and vegetarians.    GFR calc non Af Amer  Date Value Ref Range Status  12/26/2019 55 (L) >59 mL/min/1.73 Final   eGFR  Date Value Ref Range Status  11/05/2022 48 (L) >59 mL/min/1.73 Final         Passed - Valid encounter within last 6 months    Recent Outpatient Visits           2 months ago Flu vaccine need   [place] [facility] Larae Grooms, NP   7 months ago Acute upper respiratory infection   [place] [facility] Lar[facility] G[facility], NP   9 months ago Hypertension associated with diabetes [facility])   [place] [facility] Larae Grooms, NP   1 year ago Hypertension associated with  diabetes [facility])   [place] [facility] Larae Grooms, NP   1 year ago Annual physical exam   [place] [facility] Larae Grooms, NP       Future Appointments             In 1 week Larae Grooms, NP [place] [facility]

## 2023-02-03 ENCOUNTER — Other Ambulatory Visit: Payer: Self-pay

## 2023-02-03 NOTE — Telephone Encounter (Signed)
Patient now using mail order.  

## 2023-02-04 NOTE — Telephone Encounter (Signed)
Error

## 2023-02-08 ENCOUNTER — Encounter: Payer: Self-pay | Admitting: Nurse Practitioner

## 2023-02-08 ENCOUNTER — Ambulatory Visit (INDEPENDENT_AMBULATORY_CARE_PROVIDER_SITE_OTHER): Payer: Medicare HMO | Admitting: Nurse Practitioner

## 2023-02-08 VITALS — BP 110/68 | HR 64 | Temp 97.7°F | Ht 62.0 in | Wt 146.6 lb

## 2023-02-08 DIAGNOSIS — I152 Hypertension secondary to endocrine disorders: Secondary | ICD-10-CM

## 2023-02-08 DIAGNOSIS — E1159 Type 2 diabetes mellitus with other circulatory complications: Secondary | ICD-10-CM | POA: Diagnosis not present

## 2023-02-08 DIAGNOSIS — N1831 Chronic kidney disease, stage 3a: Secondary | ICD-10-CM | POA: Diagnosis not present

## 2023-02-08 DIAGNOSIS — I700 Atherosclerosis of aorta: Secondary | ICD-10-CM

## 2023-02-08 DIAGNOSIS — E538 Deficiency of other specified B group vitamins: Secondary | ICD-10-CM | POA: Diagnosis not present

## 2023-02-08 DIAGNOSIS — R809 Proteinuria, unspecified: Secondary | ICD-10-CM

## 2023-02-08 DIAGNOSIS — E785 Hyperlipidemia, unspecified: Secondary | ICD-10-CM | POA: Diagnosis not present

## 2023-02-08 DIAGNOSIS — E039 Hypothyroidism, unspecified: Secondary | ICD-10-CM

## 2023-02-08 DIAGNOSIS — Z7985 Long-term (current) use of injectable non-insulin antidiabetic drugs: Secondary | ICD-10-CM

## 2023-02-08 DIAGNOSIS — E1129 Type 2 diabetes mellitus with other diabetic kidney complication: Secondary | ICD-10-CM

## 2023-02-08 DIAGNOSIS — E1169 Type 2 diabetes mellitus with other specified complication: Secondary | ICD-10-CM | POA: Diagnosis not present

## 2023-02-08 LAB — MICROALBUMIN, URINE WAIVED
Creatinine, Urine Waived: 100 mg/dL (ref 10–300)
Microalb, Ur Waived: 30 mg/L — ABNORMAL HIGH (ref 0–19)
Microalb/Creat Ratio: <30 mg/g (ref <30)

## 2023-02-08 NOTE — Assessment & Plan Note (Signed)
Chronic.  Controlled.  Continue with current medication regimen of Lisinopril 10mg .  Refills sent today.  Labs ordered today.  Return to clinic in 6 months for reevaluation.  Call sooner if concerns arise.

## 2023-02-08 NOTE — Assessment & Plan Note (Signed)
Chronic.  Stable.  On ACE and GLP1.  Labs ordered at visit today.  Will make recommendations based on lab results.

## 2023-02-08 NOTE — Assessment & Plan Note (Signed)
Labs ordered at visit today.  Will make recommendations based on lab results.

## 2023-02-08 NOTE — Assessment & Plan Note (Signed)
Chronic. Will make recommendations based on lab results. Continue with current dose of Levothyroxine.  Refills sent today.  Follow up in 6 months.  Call sooner if concerns arise.

## 2023-02-08 NOTE — Assessment & Plan Note (Signed)
Chronic.  Controlled at 6.4%.  Doing well with Ozempic.  Has lost 12lbs since last visit.  Suspect her A1c will be lower and will need to decrease dose of Pioglitazone or Glyberide to prevent hypoglycemia.  Continue with current medication regimen on Metformin, Actos, and Glyburide. Refills sent today.   Sugars running 60-80 fasting.  Labs ordered today.  Return to clinic in 3 months for reevaluation.  Call sooner if concerns arise.

## 2023-02-08 NOTE — Progress Notes (Signed)
BP 110/68 (BP Location: Left Arm, Patient Position: Sitting, Cuff Size: Large)   Pulse 64   Temp 97.7 F (36.5 C) (Oral)   Ht 5\' 2"  (1.575 m)   Wt 146 lb 9.6 oz (66.5 kg)   LMP  (LMP Unknown)   SpO2 98%   BMI 26.81 kg/m    Subjective:    Patient ID: Emma Berry, female    DOB: 1950-02-18, 73 y.o.   MRN: [id number]   HPI: Emma Berry is a 73 y.o. female  Chief Complaint  Patient presents with   Hypertension   Hyperlipidemia   Diabetes   HYPERTENSION / HYPERLIPIDEMIA Satisfied with current treatment? yes Duration of hypertension: years BP monitoring frequency: weekly BP range: 110-120/60-70 BP medication side effects: no Past BP meds: quinapril Duration of hyperlipidemia: years Cholesterol medication side effects: no Cholesterol supplements: none Past cholesterol medications: pravastatin (pravachol) Medication compliance: excellent compliance Aspirin: yes Recent stressors: no Recurrent headaches: no Visual changes: no Palpitations: no Dyspnea: no Chest pain: no Lower extremity edema: no Dizzy/lightheaded: no  HYPOTHYROIDISM Thyroid control status:controlled Satisfied with current treatment? yes Medication side effects: no Medication compliance: excellent compliance Etiology of hypothyroidism:  Recent dose adjustment:no Fatigue: no Cold intolerance: no Heat intolerance: no Weight gain: no Weight loss: no Constipation: no Diarrhea/loose stools: no Palpitations: no Lower extremity edema: no Anxiety/depressed mood: no  CHRONIC KIDNEY DISEASE CKD status: controlled Medications renally dose: no Previous renal evaluation: no Pneumovax:  Up to Date Influenza Vaccine:  Up to Date  DIABETES Paitent has lost 12lbs since her last visit.  She is taking Ozempic 0.5mg .  She is Berry well have has seen her appetite decrease. Hypoglycemic episodes:no symptoms Polydipsia/polyuria: no Visual disturbance: no Chest pain: no Paresthesias: no Glucose  Monitoring: no  Accucheck frequency: Daily  Fasting glucose: 80  Post prandial:  Evening:  Before meals: Taking Insulin?: no  Long acting insulin:  Short acting insulin: Blood Pressure Monitoring: weekly Retinal Examination: Up to Date Foot Exam: Completed this visit Diabetic Education: Not Completed Pneumovax: Up to Date Influenza:Completed this visit Aspirin: yes     Relevant past medical, surgical, family and social history reviewed and updated as indicated. Interim medical history since our last visit reviewed. Allergies and medications reviewed and updated.  Review of Systems  Constitutional:  Negative for activity change, appetite change, chills and fever.  Eyes:  Negative for visual disturbance.  Respiratory:  Negative for cough, chest tightness and shortness of breath.   Cardiovascular:  Negative for chest pain, palpitations and leg swelling.  Gastrointestinal:  Negative for constipation and diarrhea.  Endocrine: Negative for polydipsia and polyuria.  Neurological:  Negative for dizziness, light-headedness, numbness and headaches.  Psychiatric/Behavioral:  Negative for confusion, sleep disturbance and suicidal ideas.     Per HPI unless specifically indicated above     Objective:    BP 110/68 (BP Location: Left Arm, Patient Position: Sitting, Cuff Size: Large)   Pulse 64   Temp 97.7 F (36.5 C) (Oral)   Ht 5\' 2"  (1.575 m)   Wt 146 lb 9.6 oz (66.5 kg)   LMP  (LMP Unknown)   SpO2 98%   BMI 26.81 kg/m   Wt Readings from Last 3 Encounters:  02/08/23 146 lb 9.6 oz (66.5 kg)  11/05/22 158 lb 12.8 oz (72 kg)  06/23/22 152 lb 3.2 oz (69 kg)    Physical Exam Vitals and nursing note reviewed.  Constitutional:      General: She is not in  acute distress.    Appearance: Normal appearance. She is normal weight. She is not ill-appearing, toxic-appearing or diaphoretic.  HENT:     Head: Normocephalic.     Right Ear: External ear normal.     Left Ear: External ear  normal.     Nose: Nose normal.     Mouth/Throat:     Mouth: Mucous membranes are moist.     Pharynx: Oropharynx is clear.  Eyes:     General:        Right eye: No discharge.        Left eye: No discharge.     Extraocular Movements: Extraocular movements intact.     Conjunctiva/sclera: Conjunctivae normal.     Pupils: Pupils are equal, round, and reactive to light.  Cardiovascular:     Rate and Rhythm: Normal rate and regular rhythm.     Heart sounds: No murmur heard. Pulmonary:     Effort: Pulmonary effort is normal. No respiratory distress.     Breath sounds: Normal breath sounds. No wheezing or rales.  Musculoskeletal:     Cervical back: Normal range of motion and neck supple.  Skin:    General: Skin is warm and dry.     Capillary Refill: Capillary refill takes less than 2 seconds.  Neurological:     General: No focal deficit present.     Mental Status: She is alert and oriented to person, place, and time. Mental status is at baseline.  Psychiatric:        Mood and Affect: Mood normal.        Behavior: Behavior normal.        Thought Content: Thought content normal.        Judgment: Judgment normal.     Results for orders placed or performed in visit on 12/31/22  HM DIABETES EYE EXAM   Collection Time: 12/29/22  7:26 AM  Result Value Ref Range   HM Diabetic Eye Exam        Assessment & Plan:   Problem List Items Addressed This Visit       Cardiovascular and Mediastinum   Hypertension associated with diabetes (HCC)   Chronic.  Controlled.  Continue with current medication regimen of Lisinopril 10mg .  Refills sent today.  Labs ordered today.  Return to clinic in 6 months for reevaluation.  Call sooner if concerns arise.       Aortic atherosclerosis (HCC)   Noted on CT imaging 07/01/2017.  Continue pravastatin and ASA daily for prevention + good diabetes control.  Continue with Pravastatin.        Endocrine   Type 2 diabetes mellitus with proteinuria (HCC) -  Primary   Chronic.  Controlled at 6.4%.  Berry well with Ozempic.  Has lost 12lbs since last visit.  Suspect her A1c will be lower and will need to decrease dose of Pioglitazone or Glyberide to prevent hypoglycemia.  Continue with current medication regimen on Metformin, Actos, and Glyburide. Refills sent today.   Sugars running 60-80 fasting.  Labs ordered today.  Return to clinic in 3 months for reevaluation.  Call sooner if concerns arise.       Relevant Orders   Hemoglobin A1c   Comprehensive metabolic panel   Microalbumin, Urine Waived   Hyperlipidemia associated with type 2 diabetes mellitus (HCC)   Chronic.  Controlled.  Continue with current medication regimen on Pravastatin 40mg .  Refills sent today.  Labs ordered today.  Return to clinic in 6 months for  reevaluation.  Call sooner if concerns arise.       Relevant Orders   Lipid Panel w/o Chol/HDL Ratio   Hypothyroidism   Chronic. Will make recommendations based on lab results. Continue with current dose of Levothyroxine.  Refills sent today.  Follow up in 6 months.  Call sooner if concerns arise.       Relevant Orders   TSH   T4, free     Genitourinary   CKD (chronic kidney disease) stage 3, GFR 30-59 ml/min (HCC)   Chronic.  Stable.  On ACE and GLP1.  Labs ordered at visit today.  Will make recommendations based on lab results.         Other   B12 deficiency   Labs ordered at visit today.  Will make recommendations based on lab results.        Relevant Orders   B12     Follow up plan: Return in about 3 months (around 05/09/2023) for HTN, HLD, DM2 FU.

## 2023-02-08 NOTE — Assessment & Plan Note (Signed)
Noted on CT imaging 07/01/2017.  Continue pravastatin and ASA daily for prevention + good diabetes control.  Continue with Pravastatin.

## 2023-02-08 NOTE — Assessment & Plan Note (Signed)
Chronic.  Controlled.  Continue with current medication regimen on Pravastatin 40mg .  Refills sent today.  Labs ordered today.  Return to clinic in 6 months for reevaluation.  Call sooner if concerns arise.

## 2023-02-09 ENCOUNTER — Encounter: Payer: Self-pay | Admitting: Nurse Practitioner

## 2023-02-09 LAB — TSH: TSH: 0.985 u[IU]/mL (ref 0.450–4.500)

## 2023-02-09 LAB — COMPREHENSIVE METABOLIC PANEL
ALT: 7 [IU]/L (ref 0–32)
AST: 14 [IU]/L (ref 0–40)
Albumin: 3.9 g/dL (ref 3.8–4.8)
Alkaline Phosphatase: 74 [IU]/L (ref 44–121)
BUN/Creatinine Ratio: 19 (ref 12–28)
BUN: 20 mg/dL (ref 8–27)
Bilirubin Total: 0.3 mg/dL (ref 0.0–1.2)
CO2: 24 mmol/L (ref 20–29)
Calcium: 9.5 mg/dL (ref 8.7–10.3)
Chloride: 102 mmol/L (ref 96–106)
Creatinine, Ser: 1.04 mg/dL — ABNORMAL HIGH (ref 0.57–1.00)
Globulin, Total: 2.6 g/dL (ref 1.5–4.5)
Glucose: 76 mg/dL (ref 70–99)
Potassium: 4.7 mmol/L (ref 3.5–5.2)
Sodium: 137 mmol/L (ref 134–144)
Total Protein: 6.5 g/dL (ref 6.0–8.5)
eGFR: 56 mL/min/{1.73_m2} — ABNORMAL LOW (ref >59)

## 2023-02-09 LAB — LIPID PANEL W/O CHOL/HDL RATIO
Cholesterol, Total: 122 mg/dL (ref 100–199)
HDL: 47 mg/dL (ref >39)
LDL Chol Calc (NIH): 57 mg/dL (ref 0–99)
Triglycerides: 92 mg/dL (ref 0–149)
VLDL Cholesterol Cal: 18 mg/dL (ref 5–40)

## 2023-02-09 LAB — T4, FREE: Free T4: 1.77 ng/dL (ref 0.82–1.77)

## 2023-02-09 LAB — VITAMIN B12: Vitamin B-12: 1384 pg/mL — ABNORMAL HIGH (ref 232–1245)

## 2023-02-09 LAB — HEMOGLOBIN A1C
Est. average glucose Bld gHb Est-mCnc: 131 mg/dL
Hgb A1c MFr Bld: 6.2 % — ABNORMAL HIGH (ref 4.8–5.6)

## 2023-02-09 MED ORDER — PIOGLITAZONE HCL 15 MG PO TABS: 15.0000 mg | ORAL_TABLET | Freq: Every day | ORAL | 1 refills | Status: DC

## 2023-02-09 NOTE — Addendum Note (Signed)
Addended by: Larae Grooms on: 02/09/2023 08:17 AM   Modules accepted: Orders

## 2023-02-10 ENCOUNTER — Other Ambulatory Visit: Payer: Self-pay | Admitting: Nurse Practitioner

## 2023-02-10 NOTE — Telephone Encounter (Signed)
Medication Refill -  Most Recent Primary Care Visit:  Provider: Larae Grooms  Department: [facility]  Visit Type: OFFICE VISIT  Date: 02/08/2023  Medication: gabapentin (NEURONTIN) 300 MG capsule glyBURIDE (DIABETA) 5 MG tablet levothyroxine (SYNTHROID) 75 MCG tablet [[id number]]   Has the patient contacted their pharmacy? Yes  (Agent: If yes, when and what did the pharmacy advise?) Pharmacy called in request   Is this the correct pharmacy for this prescription? Yes  This is the patient's preferred pharmacy:    [facility] - [place], [place] - [address] [place] [address] Phone: [phone] Fax: [phone]   Has the prescription been filled recently? Yes  Is the patient out of the medication? No  Has the patient been seen for an appointment in the last year OR does the patient have an upcoming appointment? Yes  Can we respond through MyChart? Yes  Agent: Please be advised that Rx refills may take up to 3 business days. We ask that you follow-up with your pharmacy.

## 2023-02-11 ENCOUNTER — Other Ambulatory Visit: Payer: Self-pay | Admitting: Nurse Practitioner

## 2023-02-11 MED ORDER — GABAPENTIN 300 MG PO CAPS: 300.0000 mg | ORAL_CAPSULE | Freq: Three times a day (TID) | ORAL | 1 refills | Status: DC

## 2023-02-11 MED ORDER — GLYBURIDE 5 MG PO TABS: 5.0000 mg | ORAL_TABLET | Freq: Every day | ORAL | 1 refills | Status: DC

## 2023-02-11 MED ORDER — LEVOTHYROXINE SODIUM 75 MCG PO TABS: ORAL_TABLET | ORAL | 1 refills | Status: DC

## 2023-02-11 NOTE — Telephone Encounter (Signed)
Glyburide and lisinopril refilled today in a separate refill encounter, will refuse due to this.  Requested Prescriptions  Pending Prescriptions Disp Refills   lisinopril (ZESTRIL) 10 MG tablet [Pharmacy Med Name: lisinopril 10 mg tablet] 90 tablet 0    Sig: TAKE ONE TABLET BY MOUTH ONCE DAILY     Cardiovascular:  ACE Inhibitors Failed - 02/11/2023  3:21 PM      Failed - Cr in normal range and within 180 days    Creatinine  Date Value Ref Range Status  02/23/2011 0.88 0.60 - 1.30 mg/dL Final   Creatinine, Ser  Date Value Ref Range Status  02/08/2023 1.04 (H) 0.57 - 1.00 mg/dL Final         Passed - K in normal range and within 180 days    Potassium  Date Value Ref Range Status  02/08/2023 4.7 3.5 - 5.2 mmol/L Final         Passed - Patient is not pregnant      Passed - Last BP in normal range    BP Readings from Last 1 Encounters:  02/08/23 110/68         Passed - Valid encounter within last 6 months    Recent Outpatient Visits           3 days ago Type 2 diabetes mellitus with proteinuria (HCC)   [place] [facility] Larae Grooms, NP   3 months ago Flu vaccine need   [place] [facility] Larae Grooms, NP   7 months ago Acute upper respiratory infection   [place] [facility] Larae Grooms, NP   9 months ago Hypertension associated with diabetes [facility])   [place] [facility] Larae Grooms, NP   1 year ago Hypertension associated with diabetes [facility])   [place] [facility] Larae Grooms, NP       Future Appointments             In 3 months Larae Grooms, NP [place] [facility]            Refused Prescriptions Disp Refills   levothyroxine (SYNTHROID) 75 MCG tablet [Pharmacy Med Name: levothyroxine 75 mcg tablet] 90 tablet 1    Sig: TAKE ONE TABLET BY MOUTH EVERY MORNING BEFORE BREAKFAST     Endocrinology:  Hypothyroid Agents Passed -  02/11/2023  3:21 PM      Passed - TSH in normal range and within 360 days    TSH  Date Value Ref Range Status  02/08/2023 0.985 0.450 - 4.500 uIU/mL Final         Passed - Valid encounter within last 12 months    Recent Outpatient Visits           3 days ago Type 2 diabetes mellitus with proteinuria (HCC)   [place] [facility] Larae Grooms, NP   3 months ago Flu vaccine need   [place] [facility] Larae Grooms, NP   7 months ago Acute upper respiratory infection   [place] [facility], NP   9 months ago Hypertension associated with diabetes [facility])   [place] [facility] Larae Grooms, NP   1 year ago Hypertension associated with diabetes [facility])   [place] [facility] Larae Grooms, NP       Future Appointments             In 3 months Larae Grooms, NP  [place] [facility]             glyBURIDE (DIABETA) 5 MG tablet [Pharmacy Med Name: glyburide 5 mg tablet] 90 tablet 1    Sig: TAKE ONE TABLET BY MOUTH EVERY MORNING WITH BREAKFAST     Endocrinology:  Diabetes - Sulfonylureas - glyburide Failed - 02/11/2023  3:21 PM      Failed - HBA1C in normal range and within 180 days    Hemoglobin A1C  Date Value Ref Range Status  11/24/2015 7.2%  Final   HB A1C (BAYER DCA - WAIVED)  Date Value Ref Range Status  12/26/2019 6.2 <7.0 % Final    Comment:                                          Diabetic Adult            <7.0                                       Healthy Adult        4.3 - 5.7                                                           (DCCT/NGSP) American Diabetes Association's Summary of Glycemic Recommendations for Adults with Diabetes: Hemoglobin A1c <7.0%. More stringent glycemic goals (A1c <6.0%) may further reduce complications at the cost of increased risk of hypoglycemia.    Hgb A1c MFr Bld  Date Value Ref Range  Status  02/08/2023 6.2 (H) 4.8 - 5.6 % Final    Comment:             Prediabetes: 5.7 - 6.4          Diabetes: >6.4          Glycemic control for adults with diabetes: <7.0          Failed - Cr in normal range and within 360 days    Creatinine  Date Value Ref Range Status  02/23/2011 0.88 0.60 - 1.30 mg/dL Final   Creatinine, Ser  Date Value Ref Range Status  02/08/2023 1.04 (H) 0.57 - 1.00 mg/dL Final         Failed - eGFR is 60 or above and within 360 days    EGFR (African American)  Date Value Ref Range Status  02/23/2011 >60 >69mL/min Final   GFR calc Af Amer  Date Value Ref Range Status  12/26/2019 64 >59 mL/min/1.73 Final    Comment:    **In accordance with recommendations from the NKF-ASN Task force,**   [facility] is in the process of updating its eGFR calculation to the   2021 CKD-EPI creatinine equation that estimates kidney function   without a race variable.    EGFR (Non-African Amer.)  Date Value Ref Range Status  02/23/2011 >60 >90mL/min Final    Comment:    eGFR values <60mL/min/1.73 m2 may be an indication of chronic kidney disease (CKD). Calculated eGFR, using the MRDR Study equation, is useful in  patients with stable renal function. The eGFR calculation will not be reliable  in acutely ill patients when serum creatinine is changing rapidly. It is not useful in patients on dialysis. The eGFR calculation may not be applicable to patients at the low and high extremes of body sizes, pregnant women, and vegetarians.    GFR calc non Af Amer  Date Value Ref Range Status  12/26/2019 55 (L) >59 mL/min/1.73 Final   eGFR  Date Value Ref Range Status  02/08/2023 56 (L) >59 mL/min/1.73 Final         Passed - Valid encounter within last 6 months    Recent Outpatient Visits           3 days ago Type 2 diabetes mellitus with proteinuria [facility])   [place] [facility] Larae Grooms, NP   3 months ago Flu vaccine need   [place]  [facility] Lar[facility], [facility]   7 months ago Acute upper respiratory infection   [place] [facility], NP   9 months ago Hypertension associated with diabetes [facility])   [place] [facility] Larae Grooms, NP   1 year ago Hypertension associated with diabetes [facility])   [place] [facility] Larae Grooms, NP       Future Appointments             In 3 months Larae Grooms, NP  [facility]

## 2023-02-11 NOTE — Telephone Encounter (Signed)
Requested Prescriptions  Pending Prescriptions Disp Refills   gabapentin (NEURONTIN) 300 MG capsule 270 capsule 1    Sig: Take 1 capsule (300 mg total) by mouth 3 (three) times daily.     Neurology: Anticonvulsants - gabapentin Failed - 02/11/2023  1:34 PM      Failed - Cr in normal range and within 360 days    Creatinine  Date Value Ref Range Status  02/23/2011 0.88 0.60 - 1.30 mg/dL Final   Creatinine, Ser  Date Value Ref Range Status  02/08/2023 1.04 (H) 0.57 - 1.00 mg/dL Final         Passed - Completed PHQ-2 or PHQ-9 in the last 360 days      Passed - Valid encounter within last 12 months    Recent Outpatient Visits           3 days ago Type 2 diabetes mellitus with proteinuria (HCC)   [place] [facility] Larae Grooms, NP   3 months ago Flu vaccine need   [place] [facility] Larae Grooms, NP   7 months ago Acute upper respiratory infection   [place] [facility] Larae Grooms, NP   9 months ago Hypertension associated with diabetes [facility])   [place] [facility] Larae Grooms, NP   1 year ago Hypertension associated with diabetes [facility])   [place] [facility] Larae Grooms, NP       Future Appointments             In 3 months Larae Grooms, NP [place] [facility]             levothyroxine (SYNTHROID) 75 MCG tablet 90 tablet 1    Sig: TAKE 1 TABLET BY MOUTH ONCE DAILY BEFORE BREAKFAST.     Endocrinology:  Hypothyroid Agents Passed - 02/11/2023  1:34 PM      Passed - TSH in normal range and within 360 days    TSH  Date Value Ref Range Status  02/08/2023 0.985 0.450 - 4.500 uIU/mL Final         Passed - Valid encounter within last 12 months    Recent Outpatient Visits           3 days ago Type 2 diabetes mellitus with proteinuria (HCC)   [place] [facility] Larae Grooms, NP   3 months ago Flu vaccine need    [place] [facility] Larae Grooms, NP   7 months ago Acute upper respiratory infection   [place] [facility] Larae Grooms, NP   9 months ago Hypertension associated with diabetes [facility])   [place] [facility] Larae Grooms, NP   1 year ago Hypertension associated with diabetes [facility])   [place] [facility] Larae Grooms, NP       Future Appointments             In 3 months Larae Grooms, NP [place] [facility]             glyBURIDE (DIABETA) 5 MG tablet 90 tablet 1    Sig: Take 1 tablet (5 mg total) by mouth daily with breakfast.     Endocrinology:  Diabetes - Sulfonylureas - glyburide Failed - 02/11/2023  1:34 PM      Failed - HBA1C in normal range and within 180 days    Hemoglobin A1C  Date Value Ref Range Status  11/24/2015 7.2%  Final   HB A1C (BAYER DCA - WAIVED)  Date Value Ref Range Status  12/26/2019 6.2 <7.0 % Final    Comment:                                          Diabetic Adult            <7.0                                       Healthy Adult        4.3 - 5.7                                                           (DCCT/NGSP) American Diabetes Association's Summary of Glycemic Recommendations for Adults with Diabetes: Hemoglobin A1c <7.0%. More stringent glycemic goals (A1c <6.0%) may further reduce complications at the cost of increased risk of hypoglycemia.    Hgb A1c MFr Bld  Date Value Ref Range Status  02/08/2023 6.2 (H) 4.8 - 5.6 % Final    Comment:             Prediabetes: 5.7 - 6.4          Diabetes: >6.4          Glycemic control for adults with diabetes: <7.0          Failed - Cr in normal range and within 360 days    Creatinine  Date Value Ref Range Status  02/23/2011 0.88 0.60 - 1.30 mg/dL Final   Creatinine, Ser  Date Value Ref Range Status  02/08/2023 1.04 (H) 0.57 - 1.00 mg/dL Final         Failed - eGFR is 60 or  above and within 360 days    EGFR (African American)  Date Value Ref Range Status  02/23/2011 >60 >53mL/min Final   GFR calc Af Amer  Date Value Ref Range Status  12/26/2019 64 >59 mL/min/1.73 Final    Comment:    **In accordance with recommendations from the NKF-ASN Task force,**   [facility] is in the process of updating its eGFR calculation to the   2021 CKD-EPI creatinine equation that estimates kidney function   without a race variable.    EGFR (Non-African Amer.)  Date Value Ref Range Status  02/23/2011 >60 >18mL/min Final    Comment:    eGFR values <1mL/min/1.73 m2 may be an indication of chronic kidney disease (CKD). Calculated eGFR, using the MRDR Study equation, is useful in  patients with stable renal function. The eGFR calculation will not be reliable in acutely ill patients when serum creatinine is changing rapidly. It is not useful in patients on dialysis. The eGFR calculation may not be applicable to patients at the low and high extremes of body sizes, pregnant women, and vegetarians.    GFR calc non Af Amer  Date Value Ref Range Status  12/26/2019 55 (L) >59 mL/min/1.73 Final   eGFR  Date Value Ref Range Status  02/08/2023 56 (L) >59 mL/min/1.73 Final         Passed - Valid encounter within last 6 months  Recent Outpatient Visits           3 days ago Type 2 diabetes mellitus with proteinuria [facility])   [place] [facility] Larae Grooms, NP   3 months ago Flu vaccine need   [place] [facility] Larae G[facility], NP   7 months ago Acute upper respiratory infection   [place] [facility] Larae Grooms, NP   9 months ago Hypertension associated with diabetes [facility])   [place] [facility] Larae Grooms, NP   1 year ago Hypertension associated with diabetes [facility])   [place] [facility] Larae Grooms, NP       Future Appointments             In 3 months  Larae Grooms, NP [place] [facility] [address]

## 2023-02-16 ENCOUNTER — Other Ambulatory Visit: Payer: Self-pay | Admitting: Nurse Practitioner

## 2023-02-16 ENCOUNTER — Other Ambulatory Visit: Payer: Self-pay

## 2023-02-16 MED ORDER — LISINOPRIL 10 MG PO TABS: 10.0000 mg | ORAL_TABLET | Freq: Every day | ORAL | 1 refills | Status: DC

## 2023-02-16 MED ORDER — METFORMIN HCL ER 500 MG PO TB24: ORAL_TABLET | ORAL | 1 refills | Status: DC

## 2023-02-17 ENCOUNTER — Other Ambulatory Visit: Payer: Self-pay | Admitting: Nurse Practitioner

## 2023-02-17 MED ORDER — SEMAGLUTIDE(0.25 OR 0.5MG/DOS) 2 MG/3ML ~~LOC~~ SOPN: 0.5000 mg | PEN_INJECTOR | SUBCUTANEOUS | 0 refills | Status: DC

## 2023-02-17 NOTE — Telephone Encounter (Signed)
 Patient would like to request refill of her Semaglutide ,0.25 or 0.5MG /DOS, 2 MG/3ML SOPN be sent to [facility] for her next refill- she still has her dose for this week- but will need next week. Patient alos states she has stopped glyBURIDE  (DIABETA ) 5 MG tablet per provider instruction (see lab notes) so that medication should not be on her current medication list. Will send refill request to [facility] pool- but office will have to remove medication from list-so this note will be sent to them as well

## 2023-02-17 NOTE — Telephone Encounter (Signed)
 Change in pharmacy- remaining RF sent to mail order pharmacy  Requested Prescriptions  Pending Prescriptions Disp Refills   Semaglutide ,0.25 or 0.5MG /DOS, 2 MG/3ML SOPN 6 mL 1    Sig: Inject 0.5 mg into the skin once a week.     Endocrinology:  Diabetes - GLP-1 Receptor Agonists - semaglutide  Failed - 02/17/2023  1:31 PM      Failed - HBA1C in normal range and within 180 days    Hemoglobin A1C  Date Value Ref Range Status  11/24/2015 7.2%  Final   HB A1C (BAYER DCA - WAIVED)  Date Value Ref Range Status  12/26/2019 6.2 <7.0 % Final    Comment:                                          Diabetic Adult            <7.0                                       Healthy Adult        4.3 - 5.7                                                           (DCCT/NGSP) American Diabetes Association's Summary of Glycemic Recommendations for Adults with Diabetes: Hemoglobin A1c <7.0%. More stringent glycemic goals (A1c <6.0%) may further reduce complications at the cost of increased risk of hypoglycemia.    Hgb A1c MFr Bld  Date Value Ref Range Status  02/08/2023 6.2 (H) 4.8 - 5.6 % Final    Comment:             Prediabetes: 5.7 - 6.4          Diabetes: >6.4          Glycemic control for adults with diabetes: <7.0          Failed - Cr in normal range and within 360 days    Creatinine  Date Value Ref Range Status  02/23/2011 0.88 0.60 - 1.30 mg/dL Final   Creatinine, Ser  Date Value Ref Range Status  02/08/2023 1.04 (H) 0.57 - 1.00 mg/dL Final         Passed - Valid encounter within last 6 months    Recent Outpatient Visits           1 week ago Type 2 diabetes mellitus with proteinuria (HCC)   [facility] Melvin Pao, NP   3 months ago Flu vaccine need   [place] [facility] Melvin Pao, NP   7 months ago Acute upper respiratory infection   [place]  [facility] Melvin Pao, NP   9 months ago Hypertension  associated with diabetes [facility])   [place] [facility] Melvin Pao, NP   1 year ago Hypertension associated with diabetes [facility])   [place] [facility] Melvin Pao, NP       Future Appointments             In 3 months Melvin Pao, NP [facility], [facility]

## 2023-02-17 NOTE — Telephone Encounter (Signed)
 Call to patient- using [facility] as primary pharmacy now- not needed. Requested Prescriptions  Pending Prescriptions Disp Refills   levothyroxine  (SYNTHROID ) 75 MCG tablet [Pharmacy Med Name: levothyroxine  75 mcg tablet] 90 tablet 1    Sig: TAKE ONE TABLET BY MOUTH EVERY MORNING BEFORE BREAKFAST     Endocrinology:  Hypothyroid Agents Passed - 02/17/2023  1:22 PM      Passed - TSH in normal range and within 360 days    TSH  Date Value Ref Range Status  02/08/2023 0.985 0.450 - 4.500 uIU/mL Final         Passed - Valid encounter within last 12 months    Recent Outpatient Visits           1 week ago Type 2 diabetes mellitus with proteinuria (HCC)   [place] [facility] Melvin Pao, NP   3 months ago Flu vaccine need   [place] [facility] Melvin Pao, NP   7 months ago Acute upper respiratory infection   [place] [facility] Melvin Pao, NP   9 months ago Hypertension associated with diabetes [facility])   [place] [facility] Melvin Pao, NP   1 year ago Hypertension associated with diabetes [facility])    [facility] Melvin Pao, NP       Future Appointments             In 3 months Melvin Pao, NP  [facility], [facility]

## 2023-02-18 ENCOUNTER — Other Ambulatory Visit: Payer: Self-pay | Admitting: Nurse Practitioner

## 2023-02-21 NOTE — Telephone Encounter (Signed)
 Change of pharmacy Requested Prescriptions  Pending Prescriptions Disp Refills   glyBURIDE  (DIABETA ) 5 MG tablet [Pharmacy Med Name: glyburide  5 mg tablet] 90 tablet 1    Sig: TAKE ONE TABLET BY MOUTH EVERY MORNING WITH BREAKFAST     Endocrinology:  Diabetes - Sulfonylureas - glyburide  Failed - 02/21/2023  9:06 AM      Failed - HBA1C in normal range and within 180 days    Hemoglobin A1C  Date Value Ref Range Status  11/24/2015 7.2%  Final   HB A1C (BAYER DCA - WAIVED)  Date Value Ref Range Status  12/26/2019 6.2 <7.0 % Final    Comment:                                          Diabetic Adult            <7.0                                       Healthy Adult        4.3 - 5.7                                                           (DCCT/NGSP) American Diabetes Association's Summary of Glycemic Recommendations for Adults with Diabetes: Hemoglobin A1c <7.0%. More stringent glycemic goals (A1c <6.0%) may further reduce complications at the cost of increased risk of hypoglycemia.    Hgb A1c MFr Bld  Date Value Ref Range Status  02/08/2023 6.2 (H) 4.8 - 5.6 % Final    Comment:             Prediabetes: 5.7 - 6.4          Diabetes: >6.4          Glycemic control for adults with diabetes: <7.0          Failed - Cr in normal range and within 360 days    Creatinine  Date Value Ref Range Status  02/23/2011 0.88 0.60 - 1.30 mg/dL Final   Creatinine, Ser  Date Value Ref Range Status  02/08/2023 1.04 (H) 0.57 - 1.00 mg/dL Final         Failed - eGFR is 60 or above and within 360 days    EGFR (African American)  Date Value Ref Range Status  02/23/2011 >60 >18mL/min Final   GFR calc Af Amer  Date Value Ref Range Status  12/26/2019 64 >59 mL/min/1.73 Final    Comment:    **In accordance with recommendations from the NKF-ASN Task force,**   [facility] is in the process of updating its eGFR calculation to the   2021 CKD-EPI creatinine equation that estimates kidney function    without a race variable.    EGFR (Non-African Amer.)  Date Value Ref Range Status  02/23/2011 >60 >10mL/min Final    Comment:    eGFR values <33mL/min/1.73 m2 may be an indication of chronic kidney disease (CKD). Calculated eGFR, using the MRDR Study equation, is useful in  patients with stable renal function. The eGFR calculation will not be reliable in acutely ill patients when serum  creatinine is changing rapidly. It is not useful in patients on dialysis. The eGFR calculation may not be applicable to patients at the low and high extremes of body sizes, pregnant women, and vegetarians.    GFR calc non Af Amer  Date Value Ref Range Status  12/26/2019 55 (L) >59 mL/min/1.73 Final   eGFR  Date Value Ref Range Status  02/08/2023 56 (L) >59 mL/min/1.73 Final         Passed - Valid encounter within last 6 months    Recent Outpatient Visits           1 week ago Type 2 diabetes mellitus with proteinuria [facility])   [place] [facility] Aileen Alexanders, NP   3 months ago Flu vaccine need   [place] [facility] Aileen Alexanders, NP   8 months ago Acute upper respiratory infection   [place] [facility] Aileen Alexanders, NP   9 months ago Hypertension associated with diabetes [facility])   [place] [facility] Aileen Alexanders, NP   1 year ago Hypertension associated with diabetes [facility])    [facility] Aileen Alexanders, NP       Future Appointments             In 3 months Aileen Alexanders, NP  [facility], [facility]

## 2023-04-09 ENCOUNTER — Other Ambulatory Visit: Payer: Self-pay | Admitting: Nurse Practitioner

## 2023-04-12 ENCOUNTER — Other Ambulatory Visit: Payer: Self-pay | Admitting: Nurse Practitioner

## 2023-04-12 NOTE — Telephone Encounter (Signed)
 Copied from CRM [id number]. Topic: Clinical - Medication Refill >> Apr 12, 2023 11:50 AM Marland Kitchen D wrote: Most Recent Primary Care Visit:  Provider: Larae Grooms  Department: [id number]  Visit Type: OFFICE VISIT  Date: 02/08/2023  Medication: Semaglutide,0.25 or 0.5MG /DOS, (OZEMPIC, 0.25 OR 0.5 MG/DOSE,) 2 MG/3ML SOPN  Has the patient contacted their pharmacy? Yes (Agent: If no, request that the patient contact the pharmacy for the refill. If patient does not wish to contact the pharmacy document the reason why and proceed with request.) (Agent: If yes, when and what did the pharmacy advise?)  Is this the correct pharmacy for this prescription? Yes If no, delete pharmacy and type the correct one.  This is the patient's preferred pharmacy:  [facility] - [place], [place] - [address] [place] [address] Phone: [phone] Fax: [phone]   Has the prescription been filled recently? No  Is the patient out of the medication? Yes  Has the patient been seen for an appointment in the last year OR does the patient have an upcoming appointment? Yes  Can we respond through MyChart? No  Agent: Please be advised that Rx refills may take up to 3 business days. We ask that you follow-up with your pharmacy. >> Apr 12, 2023 11:51 AM Marland Kitchen D wrote: Patient gave this number and said the pharmacy can be reached [phone]

## 2023-04-12 NOTE — Telephone Encounter (Signed)
 Requested Prescriptions  Pending Prescriptions Disp Refills   Semaglutide,0.25 or 0.5MG /DOS, (OZEMPIC, 0.25 OR 0.5 MG/DOSE,) 2 MG/3ML SOPN [Pharmacy Med Name: Ozempic (0.25 or 0.5 MG/DOSE) Subcutaneous Solution Pen-injector 2 MG/3ML] 6 mL 1    Sig: INJECT 0.5MG  UNDER THE SKIN ONE TIME WEEKLY     Endocrinology:  Diabetes - GLP-1 Receptor Agonists - semaglutide Failed - 04/12/2023 10:30 AM      Failed - HBA1C in normal range and within 180 days    Hemoglobin A1C  Date Value Ref Range Status  11/24/2015 7.2%  Final   HB A1C (BAYER DCA - WAIVED)  Date Value Ref Range Status  12/26/2019 6.2 <7.0 % Final    Comment:                                          Diabetic Adult            <7.0                                       Healthy Adult        4.3 - 5.7                                                           (DCCT/NGSP) American Diabetes Association's Summary of Glycemic Recommendations for Adults with Diabetes: Hemoglobin A1c <7.0%. More stringent glycemic goals (A1c <6.0%) may further reduce complications at the cost of increased risk of hypoglycemia.    Hgb A1c MFr Bld  Date Value Ref Range Status  02/08/2023 6.2 (H) 4.8 - 5.6 % Final    Comment:             Prediabetes: 5.7 - 6.4          Diabetes: >6.4          Glycemic control for adults with diabetes: <7.0          Failed - Cr in normal range and within 360 days    Creatinine  Date Value Ref Range Status  02/23/2011 0.88 0.60 - 1.30 mg/dL Final   Creatinine, Ser  Date Value Ref Range Status  02/08/2023 1.04 (H) 0.57 - 1.00 mg/dL Final         Failed - Valid encounter within last 6 months    Recent Outpatient Visits   None     Future Appointments             In 1 month Larae Grooms, NP [facility], [facility]

## 2023-04-14 NOTE — Telephone Encounter (Signed)
 Rx 04/12/23 6ml- duplicate request Requested Prescriptions  Pending Prescriptions Disp Refills   Semaglutide,0.25 or 0.5MG /DOS, (OZEMPIC, 0.25 OR 0.5 MG/DOSE,) 2 MG/3ML SOPN 6 mL 0     Endocrinology:  Diabetes - GLP-1 Receptor Agonists - semaglutide Failed - 04/14/2023 10:44 AM      Failed - HBA1C in normal range and within 180 days    Hemoglobin A1C  Date Value Ref Range Status  11/24/2015 7.2%  Final   HB A1C (BAYER DCA - WAIVED)  Date Value Ref Range Status  12/26/2019 6.2 <7.0 % Final    Comment:                                          Diabetic Adult            <7.0                                       Healthy Adult        4.3 - 5.7                                                           (DCCT/NGSP) American Diabetes Association's Summary of Glycemic Recommendations for Adults with Diabetes: Hemoglobin A1c <7.0%. More stringent glycemic goals (A1c <6.0%) may further reduce complications at the cost of increased risk of hypoglycemia.    Hgb A1c MFr Bld  Date Value Ref Range Status  02/08/2023 6.2 (H) 4.8 - 5.6 % Final    Comment:             Prediabetes: 5.7 - 6.4          Diabetes: >6.4          Glycemic control for adults with diabetes: <7.0          Failed - Cr in normal range and within 360 days    Creatinine  Date Value Ref Range Status  02/23/2011 0.88 0.60 - 1.30 mg/dL Final   Creatinine, Ser  Date Value Ref Range Status  02/08/2023 1.04 (H) 0.57 - 1.00 mg/dL Final         Failed - Valid encounter within last 6 months    Recent Outpatient Visits   None     Future Appointments             In 1 month Larae Grooms, NP [facility], [facility]

## 2023-04-26 ENCOUNTER — Ambulatory Visit: Payer: Self-pay

## 2023-04-26 VITALS — Ht 62.0 in | Wt 135.0 lb

## 2023-04-26 DIAGNOSIS — Z0000 Encounter for general adult medical examination without abnormal findings: Secondary | ICD-10-CM

## 2023-04-26 DIAGNOSIS — Z1231 Encounter for screening mammogram for malignant neoplasm of breast: Secondary | ICD-10-CM

## 2023-04-26 NOTE — Progress Notes (Signed)
 Subjective:   Emma Berry is a 73 y.o. who presents for a Medicare Wellness preventive visit.  Visit Complete: Virtual I connected with  Emma Berry on 04/26/23 by a audio enabled telemedicine application and verified that I am speaking with the correct person using two identifiers.  Patient Location: Home  Provider Location: Office/Clinic  I discussed the limitations of evaluation and management by telemedicine. The patient expressed understanding and agreed to proceed.  Vital Signs: Because this visit was a virtual/telehealth visit, some criteria may be missing or patient reported. Any vitals not documented were not able to be obtained and vitals that have been documented are patient reported.  VideoDeclined- This patient declined Librarian, academic. Therefore the visit was completed with audio only.  Persons Participating in Visit: Patient.  AWV Questionnaire: No: Patient Medicare AWV questionnaire was not completed prior to this visit.  Cardiac Risk Factors include: advanced age (>46men, >4 women);diabetes mellitus;dyslipidemia;hypertension     Objective:    Today's Vitals   04/26/23 1126  Weight: 135 lb (61.2 kg)  Height: 5\' 2"  (1.575 m)   Body mass index is 24.69 kg/m.     04/26/2023   11:38 AM 04/20/2022   11:35 AM 12/01/2020    9:05 AM 11/30/2019    9:01 AM 11/29/2018    9:08 AM 11/24/2017    1:07 PM 10/31/2017    8:54 AM  Advanced Directives  Does Patient Have a Medical Advance Directive? No No No No Yes No No  Type of Advance Directive     Living will;Healthcare Power of Attorney    Copy of Healthcare Power of Attorney in Chart?     Yes - validated most recent copy scanned in chart (See row information)    Would patient like information on creating a medical advance directive? No - Patient declined No - Patient declined Yes (MAU/Ambulatory/Procedural Areas - Information given);No - Patient declined   Yes  (MAU/Ambulatory/Procedural Areas - Information given) No - Patient declined    Current Medications (verified) Outpatient Encounter Medications as of 04/26/2023  Medication Sig   calcium carbonate (OS-CAL) 600 MG TABS tablet Take 600 mg by mouth at bedtime.   Cholecalciferol (VITAMIN D3) 5000 units CAPS Take 1 capsule by mouth daily.   ferrous gluconate (FERGON) 225 (27 Fe) MG tablet Take 240 mg by mouth daily.   gabapentin (NEURONTIN) 300 MG capsule Take 1 capsule (300 mg total) by mouth 3 (three) times daily.   levothyroxine (SYNTHROID) 75 MCG tablet TAKE 1 TABLET BY MOUTH ONCE DAILY BEFORE BREAKFAST.   lisinopril (ZESTRIL) 10 MG tablet Take 1 tablet (10 mg total) by mouth daily.   metFORMIN (GLUCOPHAGE-XR) 500 MG 24 hr tablet Take 2 tablets by mouth in the morning and at bedtime.   ONE TOUCH ULTRA TEST test strip CHECK BLOOD SUGAR ONCE DAILY   pravastatin (PRAVACHOL) 40 MG tablet Take 1 tablet by mouth at bedtime.   raloxifene (EVISTA) 60 MG tablet Take 1 tablet (60 mg total) by mouth daily.   Semaglutide,0.25 or 0.5MG /DOS, (OZEMPIC, 0.25 OR 0.5 MG/DOSE,) 2 MG/3ML SOPN INJECT 0.5MG  UNDER THE SKIN ONE TIME WEEKLY   vitamin B-12 (CYANOCOBALAMIN) 1000 MCG tablet Take 1,000 mcg by mouth at bedtime.   aspirin EC 81 MG tablet Take 81 mg by mouth at bedtime. (Patient not taking: Reported on 04/26/2023)   glyBURIDE (DIABETA) 5 MG tablet TAKE ONE TABLET BY MOUTH EVERY MORNING WITH BREAKFAST (Patient not taking: Reported on 04/26/2023)  pioglitazone (ACTOS) 15 MG tablet Take 1 tablet (15 mg total) by mouth daily. (Patient not taking: Reported on 04/26/2023)   No facility-administered encounter medications on file as of 04/26/2023.    Allergies (verified) Patient has no known allergies.   History: Past Medical History:  Diagnosis Date   Chronic kidney disease    Diabetes mellitus without complication (HCC)    Hyperlipidemia    Hypertension    Hypothyroidism    Osteopenia    Osteoporosis     Thyroid disease    Past Surgical History:  Procedure Laterality Date   CESAREAN SECTION     COLONOSCOPY WITH PROPOFOL N/A 05/03/2017   Procedure: COLONOSCOPY WITH PROPOFOL;  Surgeon: [name], MD;  Location: [facility] ENDOSCOPY;  Service: Endoscopy;  Laterality: N/A;   VENTRAL HERNIA REPAIR N/A 10/31/2017   Procedure: LAPAROSCOPIC VENTRAL HERNIA;  Surgeon: Carolan Shiver, MD;  Location: [facility] ORS;  Service: General;  Laterality: N/A;   VENTRAL HERNIA REPAIR N/A 10/31/2017   Procedure: HERNIA REPAIR VENTRAL ADULT;  Surgeon: Carolan Shiver, MD;  Location: [facility] ORS;  Service: General;  Laterality: N/A;   Family History  Problem Relation Age of Onset   Cancer Mother 32       melanoma   Diabetes Mother    Thyroid disease Mother    Cancer Father 71       prostate to bone   Diabetes Daughter    Heart disease Maternal Grandfather    Stroke Paternal Grandmother    Stroke Paternal Grandfather    Breast cancer Neg Hx    Social History   Socioeconomic History   Marital status: Married    Spouse name: Siri Cole   Number of children: 3   Years of education: Not on file   Highest education level: Not on file  Occupational History   Occupation: retired  Tobacco Use   Smoking status: Never    Passive exposure: Never   Smokeless tobacco: Never  Vaping Use   Vaping status: Never Used  Substance and Sexual Activity   Alcohol use: No    Alcohol/week: 0.0 standard drinks of alcohol   Drug use: No   Sexual activity: Yes  Other Topics Concern   Not on file  Social History Narrative   Not on file   Social Drivers of Health   Financial Resource Strain: Low Risk  (04/26/2023)   Overall Financial Resource Strain (CARDIA)    Difficulty of Paying Living Expenses: Not hard at all  Food Insecurity: No Food Insecurity (04/26/2023)   Hunger Vital Sign    Worried About Running Out of Food in the Last Year: Never true    Ran Out of Food in the Last Year: Never true   Transportation Needs: No Transportation Needs (04/26/2023)   PRAPARE - Administrator, Civil Service (Medical): No    Lack of Transportation (Non-Medical): No  Physical Activity: Inactive (04/26/2023)   Exercise Vital Sign    Days of Exercise per Week: 0 days    Minutes of Exercise per Session: 0 min  Stress: No Stress Concern Present (04/26/2023)   [employer] of Occupational Health - Occupational Stress Questionnaire    Feeling of Stress : Not at all  Social Connections: Socially Integrated (04/26/2023)   Social Connection and Isolation Panel [NHANES]    Frequency of Communication with Friends and Family: More than three times a week    Frequency of Social Gatherings with Friends and Family: More than three times a week  Attends Religious Services: More than 4 times per year    Active Member of Clubs or Organizations: Yes    Attends Banker Meetings: More than 4 times per year    Marital Status: Married    Tobacco Counseling Counseling given: Not Answered    Clinical Intake:  Pre-visit preparation completed: Yes  Pain : No/denies pain     BMI - recorded: 24.69 Nutritional Status: BMI of 19-24  Normal Nutritional Risks: None Diabetes: Yes CBG done?: No (FBS 119 per patient) Did pt. bring in CBG monitor from home?: No  Lab Results  Component Value Date   HGBA1C 6.2 (H) 02/08/2023   HGBA1C 6.6 (H) 11/05/2022   HGBA1C 6.8 (H) 05/06/2022     How often do you need to have someone help you when you read instructions, pamphlets, or other written materials from your doctor or pharmacy?: 1 - Never  Interpreter Needed?: No  Information entered by :: Jaunita Messier, CMA   Activities of Daily Living     04/26/2023   11:29 AM  In your present state of health, do you have any difficulty performing the following activities:  Hearing? 0  Vision? 0  Difficulty concentrating or making decisions? 0  Walking or climbing stairs? 0  Dressing or  bathing? 0  Doing errands, shopping? 0  Preparing Food and eating ? N  Using the Toilet? N  In the past six months, have you accidently leaked urine? N  Do you have problems with loss of bowel control? N  Managing your Medications? N  Managing your Finances? N  Housekeeping or managing your Housekeeping? N    Patient Care Team: Aileen Alexanders, NP as PCP - General (Nurse Practitioner) Russ Course, A. Hansel Ley, MD (Ophthalmology)  Indicate any recent Medical Services you may have received from other than [facility] providers in the past year (date may be approximate).     Assessment:   This is a routine wellness examination for Emma Berry.  Hearing/Vision screen Hearing Screening - Comments:: Denies hearing loss Vision Screening - Comments:: Gets DM eye exams, Dr. Russ Course, [facility] [place]   Goals Addressed               This Visit's Progress     Exercise 3x per week (30 min per time) (pt-stated)         Depression Screen     04/26/2023   11:36 AM 02/08/2023    8:51 AM 11/05/2022    8:51 AM 06/23/2022   11:28 AM 05/06/2022    8:54 AM 04/20/2022   11:34 AM 11/04/2021    8:44 AM  PHQ 2/9 Scores  PHQ - 2 Score 0 0 0 0 0 0 0  PHQ- 9 Score 1 0 0 0 0 0 0    Fall Risk     04/26/2023   11:41 AM 06/23/2022   11:28 AM 05/06/2022    8:54 AM 04/20/2022   11:35 AM 11/04/2021    8:44 AM  Fall Risk   Falls in the past year? 0 0 0 0 0  Number falls in past yr: 0 0 0 0 0  Injury with Fall? 0 0 0 0 0  Risk for fall due to : No Fall Risks No Fall Risks No Fall Risks No Fall Risks No Fall Risks  Follow up Falls prevention discussed;Falls evaluation completed Falls evaluation completed Falls evaluation completed Falls prevention discussed;Falls evaluation completed Falls evaluation completed    MEDICARE RISK AT HOME:  Medicare Risk at  Home Any stairs in or around the home?: Yes If so, are there any without handrails?: No Home free of loose throw rugs in walkways, pet beds, electrical cords, etc?:  Yes Adequate lighting in your home to reduce risk of falls?: Yes Life alert?: No Use of a cane, walker or w/c?: No Grab bars in the bathroom?: Yes Shower chair or bench in shower?: Yes Elevated toilet seat or a handicapped toilet?: Yes  TIMED UP AND GO:  Was the test performed?  No  Cognitive Function: 6CIT completed        04/26/2023   11:42 AM 04/20/2022   11:42 AM 11/30/2019    9:05 AM 11/29/2018    9:09 AM 11/24/2017    1:08 PM  6CIT Screen  What Year? 0 points 0 points 0 points 0 points 0 points  What month? 0 points 0 points 0 points 0 points 0 points  What time? 0 points 0 points 0 points 0 points 0 points  Count back from 20 0 points 0 points 0 points 0 points 0 points  Months in reverse 0 points 0 points 0 points 0 points 0 points  Repeat phrase 0 points 0 points 0 points 0 points 2 points  Total Score 0 points 0 points 0 points 0 points 2 points    Immunizations Immunization History  Administered Date(s) Administered   Fluad Quad(high Dose 65+) 11/04/2020, 11/04/2021   Fluad Trivalent(High Dose 65+) 11/08/2022   Influenza, High Dose Seasonal PF 11/24/2015, 11/23/2016, 11/24/2017   Influenza,inj,Quad PF,6+ Mos 11/18/2014   Influenza-Unspecified 10/30/2018, 11/06/2019   Moderna Sars-Covid-2 Vaccination 02/07/2019, 03/07/2019, 11/13/2019   Pneumococcal Conjugate-13 11/18/2014   Pneumococcal Polysaccharide-23 04/16/2013   Rsv, Bivalent, Protein Subunit Rsvpref,pf (Abrysvo) 02/14/2023   Td 08/19/2004   Tdap 11/18/2014   Zoster, Live 10/01/2009    Screening Tests Health Maintenance  Topic Date Due   Zoster Vaccines- Shingrix (1 of 2) 03/29/1998   COVID-19 Vaccine (4 - 2024-25 season) 09/12/2022   MAMMOGRAM  01/20/2023   FOOT EXAM  05/06/2023   HEMOGLOBIN A1C  08/08/2023   INFLUENZA VACCINE  08/12/2023   OPHTHALMOLOGY EXAM  12/29/2023   Diabetic kidney evaluation - eGFR measurement  02/08/2024   Diabetic kidney evaluation - Urine ACR  02/08/2024   Medicare  Annual Wellness (AWV)  04/25/2024   DTaP/Tdap/Td (3 - Td or Tdap) 11/17/2024   DEXA SCAN  01/20/2027   Colonoscopy  05/04/2027   Pneumonia Vaccine 87+ Years old  Completed   Hepatitis C Screening  Completed   HPV VACCINES  Aged Out   Meningococcal B Vaccine  Aged Out    Health Maintenance  Health Maintenance Due  Topic Date Due   Zoster Vaccines- Shingrix (1 of 2) 03/29/1998   COVID-19 Vaccine (4 - 2024-25 season) 09/12/2022   MAMMOGRAM  01/20/2023   Health Maintenance Items Addressed: Mammogram ordered, See Nurse Notes  Additional Screening:  Vision Screening: Recommended annual ophthalmology exams for early detection of glaucoma and other disorders of the eye.  Dental Screening: Recommended annual dental exams for proper oral hygiene  Community Resource Referral / Chronic Care Management: CRR required this visit?  No   CCM required this visit?  No     Plan:     I have personally reviewed and noted the following in the patient's chart:   Medical and social history Use of alcohol, tobacco or illicit drugs  Current medications and supplements including opioid prescriptions. Patient is not currently taking opioid prescriptions. Functional ability  and status Nutritional status Physical activity Advanced directives List of other physicians Hospitalizations, surgeries, and ER visits in previous 12 months Vitals Screenings to include cognitive, depression, and falls Referrals and appointments  In addition, I have reviewed and discussed with patient certain preventive protocols, quality metrics, and best practice recommendations. A written personalized care plan for preventive services as well as general preventive health recommendations were provided to patient.     Jaunita Messier, CMA   04/26/2023   After Visit Summary: (MyChart) Due to this being a telephonic visit, the after visit summary with patients personalized plan was offered to patient via MyChart    Notes:  FBS 119 this morning per patient Needs shingles vaccine (pharmacy) Will need DM foot exam at next OV on 06/08/23. Placed order for [facility]G ([facility]) Colonoscopy no longer needed due to age Declined DM & Nutrition education Declined covid vaccine

## 2023-04-26 NOTE — Patient Instructions (Addendum)
 Emma Berry , Thank you for taking time to come for your Medicare Wellness Visit. I appreciate your ongoing commitment to your health goals. Please review the following plan we discussed and let me know if I can assist you in the future.   Referrals/Orders/Follow-Ups/Clinician Recommendations: I have placed an order for a mammogram. Call [facility] @ [phone] to schedule at your convenience. You will be due for a diabetic foot exam at your next OV on 06/08/23. Get the shingles vaccines at your local pharmacy.  This is a list of the screening recommended for you and due dates:  Health Maintenance  Topic Date Due   Zoster (Shingles) Vaccine (1 of 2) 03/29/1998   COVID-19 Vaccine (4 - 2024-25 season) 09/12/2022   Mammogram  01/20/2023   Complete foot exam   05/06/2023   Hemoglobin A1C  08/08/2023   Flu Shot  08/12/2023   Eye exam for diabetics  12/29/2023   Yearly kidney function blood test for diabetes  02/08/2024   Yearly kidney health urinalysis for diabetes  02/08/2024   Medicare Annual Wellness Visit  04/25/2024   DTaP/Tdap/Td vaccine (3 - Td or Tdap) 11/17/2024   DEXA scan (bone density measurement)  01/20/2027   Colon Cancer Screening  05/04/2027   Pneumonia Vaccine  Completed   Hepatitis C Screening  Completed   HPV Vaccine  Aged Out   Meningitis B Vaccine  Aged Out    Advanced directives: (Declined) Advance directive discussed with you today. Even though you declined this today, please call our office should you change your mind, and we can give you the proper paperwork for you to fill out.  Next Medicare Annual Wellness Visit scheduled for next year: Yes, 05/01/24 @ 11:20am (phone visit)

## 2023-05-03 ENCOUNTER — Other Ambulatory Visit: Payer: Self-pay | Admitting: Nurse Practitioner

## 2023-05-04 NOTE — Telephone Encounter (Signed)
 Requested Prescriptions  Pending Prescriptions Disp Refills   metFORMIN  (GLUCOPHAGE -XR) 500 MG 24 hr tablet [Pharmacy Med Name: metformin  ER 500 mg tablet,extended release 24 hr] 360 tablet 1    Sig: TAKE TWO TABLETS BY MOUTH TWICE DAILY morning AND bedtime     Endocrinology:  Diabetes - Biguanides Failed - 05/04/2023  9:09 AM      Failed - Cr in normal range and within 360 days    Creatinine  Date Value Ref Range Status  02/23/2011 0.88 0.60 - 1.30 mg/dL Final   Creatinine, Ser  Date Value Ref Range Status  02/08/2023 1.04 (H) 0.57 - 1.00 mg/dL Final         Failed - eGFR in normal range and within 360 days    EGFR (African American)  Date Value Ref Range Status  02/23/2011 >60 >60mL/min Final   GFR calc Af Amer  Date Value Ref Range Status  12/26/2019 64 >59 mL/min/1.73 Final    Comment:    **In accordance with recommendations from the NKF-ASN Task force,**   [facility] is in the process of updating its eGFR calculation to the   2021 CKD-EPI creatinine equation that estimates kidney function   without a race variable.    EGFR (Non-African Amer.)  Date Value Ref Range Status  02/23/2011 >60 >66mL/min Final    Comment:    eGFR values <63mL/min/1.73 m2 may be an indication of chronic kidney disease (CKD). Calculated eGFR, using the MRDR Study equation, is useful in  patients with stable renal function. The eGFR calculation will not be reliable in acutely ill patients when serum creatinine is changing rapidly. It is not useful in patients on dialysis. The eGFR calculation may not be applicable to patients at the low and high extremes of body sizes, pregnant women, and vegetarians.    GFR calc non Af Amer  Date Value Ref Range Status  12/26/2019 55 (L) >59 mL/min/1.73 Final   eGFR  Date Value Ref Range Status  02/08/2023 56 (L) >59 mL/min/1.73 Final         Failed - B12 Level in normal range and within 720 days    Vitamin B-12  Date Value Ref Range Status   02/08/2023 1,384 (H) 232 - 1,245 pg/mL Final         Passed - HBA1C is between 0 and 7.9 and within 180 days    Hemoglobin A1C  Date Value Ref Range Status  11/24/2015 7.2%  Final   HB A1C (BAYER DCA - WAIVED)  Date Value Ref Range Status  12/26/2019 6.2 <7.0 % Final    Comment:                                          Diabetic Adult            <7.0                                       Healthy Adult        4.3 - 5.7                                                           (  DCCT/NGSP) American Diabetes Association's Summary of Glycemic Recommendations for Adults with Diabetes: Hemoglobin A1c <7.0%. More stringent glycemic goals (A1c <6.0%) may further reduce complications at the cost of increased risk of hypoglycemia.    Hgb A1c MFr Bld  Date Value Ref Range Status  02/08/2023 6.2 (H) 4.8 - 5.6 % Final    Comment:             Prediabetes: 5.7 - 6.4          Diabetes: >6.4          Glycemic control for adults with diabetes: <7.0          Passed - Valid encounter within last 6 months    Recent Outpatient Visits   None     Future Appointments             In 1 month Aileen Alexanders, NP [place] [facility]            Passed - CBC within normal limits and completed in the last 12 months    WBC  Date Value Ref Range Status  11/05/2022 5.1 3.4 - 10.8 x10E3/uL Final  02/23/2011 5.2 3.6 - 11.0 x10 3/mm  Final   RBC  Date Value Ref Range Status  11/05/2022 3.18 (L) 3.77 - 5.28 x10E6/uL Final    Comment:    Ovalocytes present. CBC results reported were obtained after the specimen had been warmed to 37 degrees C.  This may indicate the presence of Cold Agglutinins.   02/23/2011 3.52 (L) 3.80 - 5.20 x10 6/mm  Final   Hemoglobin  Date Value Ref Range Status  11/05/2022 10.7 (L) 11.1 - 15.9 g/dL Final   Hematocrit  Date Value Ref Range Status  11/05/2022 33.2 (L) 34.0 - 46.6 % Final   MCHC  Date Value Ref Range Status  11/05/2022 32.2  31.5 - 35.7 g/dL Final  65/78/4696 29.5 32.0 - 36.0 g/dL Final   [facility]  Date Value Ref Range Status  11/05/2022 33.6 (H) 26.6 - 33.0 pg Final  02/23/2011 34.6 (H) 26.0 - 34.0 pg Final   MCV  Date Value Ref Range Status  11/05/2022 104 (H) 79 - 97 fL Final  02/23/2011 100 80 - 100 fL Final   No results found for: "PLTCOUNTKUC", "LABPLAT", "POCPLA" RDW  Date Value Ref Range Status  11/05/2022 12.8 11.7 - 15.4 % Final  02/23/2011 13.8 11.5 - 14.5 % Final          pravastatin  (PRAVACHOL ) 40 MG tablet [Pharmacy Med Name: pravastatin  40 mg tablet] 90 tablet 0    Sig: TAKE ONE TABLET BY MOUTH AT BEDTIME     Cardiovascular:  Antilipid - Statins Failed - 05/04/2023  9:09 AM      Failed - Lipid Panel in normal range within the last 12 months    Cholesterol, Total  Date Value Ref Range Status  02/08/2023 122 100 - 199 mg/dL Final   Cholesterol Piccolo, Waived  Date Value Ref Range Status  05/25/2016 121 <200 mg/dL Final    Comment:                            Desirable                <200                         Borderline High  200- 239                         High                     >239    LDL Chol Calc (NIH)  Date Value Ref Range Status  02/08/2023 57 0 - 99 mg/dL Final   HDL  Date Value Ref Range Status  02/08/2023 47 >39 mg/dL Final   Triglycerides  Date Value Ref Range Status  02/08/2023 92 0 - 149 mg/dL Final   Triglycerides Piccolo,Waived  Date Value Ref Range Status  05/25/2016 69 <150 mg/dL Final    Comment:                            Normal                   <150                         Borderline High     150 - 199                         High                200 - 499                         Very High                >499          Passed - Patient is not pregnant      Passed - Valid encounter within last 12 months    Recent Outpatient Visits   None     Future Appointments             In 1 month Aileen Alexanders, NP [place] [facility]             lisinopril  (ZESTRIL ) 10 MG tablet [Pharmacy Med Name: lisinopril  10 mg tablet] 90 tablet 0    Sig: TAKE ONE TABLET BY MOUTH ONCE DAILY     Cardiovascular:  ACE Inhibitors Failed - 05/04/2023  9:09 AM      Failed - Cr in normal range and within 180 days    Creatinine  Date Value Ref Range Status  02/23/2011 0.88 0.60 - 1.30 mg/dL Final   Creatinine, Ser  Date Value Ref Range Status  02/08/2023 1.04 (H) 0.57 - 1.00 mg/dL Final         Passed - K in normal range and within 180 days    Potassium  Date Value Ref Range Status  02/08/2023 4.7 3.5 - 5.2 mmol/L Final         Passed - Patient is not pregnant      Passed - Last BP in normal range    BP Readings from Last 1 Encounters:  02/08/23 110/68         Passed - Valid encounter within last 6 months    Recent Outpatient Visits   None     Future Appointments             In 1 month Aileen Alexanders, NP [facility], [facility]

## 2023-05-05 ENCOUNTER — Other Ambulatory Visit: Payer: Self-pay | Admitting: Nurse Practitioner

## 2023-05-06 NOTE — Telephone Encounter (Signed)
 Too soon for refill.  Requested Prescriptions  Pending Prescriptions Disp Refills   metFORMIN  (GLUCOPHAGE -XR) 500 MG 24 hr tablet [Pharmacy Med Name: metformin  ER 500 mg tablet,extended release 24 hr] 360 tablet 1    Sig: TAKE TWO TABLETS BY MOUTH TWICE DAILY morning AND bedtime     Endocrinology:  Diabetes - Biguanides Failed - 05/06/2023  1:14 PM      Failed - Cr in normal range and within 360 days    Creatinine  Date Value Ref Range Status  02/23/2011 0.88 0.60 - 1.30 mg/dL Final   Creatinine, Ser  Date Value Ref Range Status  02/08/2023 1.04 (H) 0.57 - 1.00 mg/dL Final         Failed - eGFR in normal range and within 360 days    EGFR (African American)  Date Value Ref Range Status  02/23/2011 >60 >88mL/min Final   GFR calc Af Amer  Date Value Ref Range Status  12/26/2019 64 >59 mL/min/1.73 Final    Comment:    **In accordance with recommendations from the NKF-ASN Task force,**   [facility] is in the process of updating its eGFR calculation to the   2021 CKD-EPI creatinine equation that estimates kidney function   without a race variable.    EGFR (Non-African Amer.)  Date Value Ref Range Status  02/23/2011 >60 >5mL/min Final    Comment:    eGFR values <46mL/min/1.73 m2 may be an indication of chronic kidney disease (CKD). Calculated eGFR, using the MRDR Study equation, is useful in  patients with stable renal function. The eGFR calculation will not be reliable in acutely ill patients when serum creatinine is changing rapidly. It is not useful in patients on dialysis. The eGFR calculation may not be applicable to patients at the low and high extremes of body sizes, pregnant women, and vegetarians.    GFR calc non Af Amer  Date Value Ref Range Status  12/26/2019 55 (L) >59 mL/min/1.73 Final   eGFR  Date Value Ref Range Status  02/08/2023 56 (L) >59 mL/min/1.73 Final         Failed - B12 Level in normal range and within 720 days    Vitamin B-12  Date Value Ref  Range Status  02/08/2023 1,384 (H) 232 - 1,245 pg/mL Final         Passed - HBA1C is between 0 and 7.9 and within 180 days    Hemoglobin A1C  Date Value Ref Range Status  11/24/2015 7.2%  Final   HB A1C (BAYER DCA - WAIVED)  Date Value Ref Range Status  12/26/2019 6.2 <7.0 % Final    Comment:                                          Diabetic Adult            <7.0                                       Healthy Adult        4.3 - 5.7                                                           (  DCCT/NGSP) American Diabetes Association's Summary of Glycemic Recommendations for Adults with Diabetes: Hemoglobin A1c <7.0%. More stringent glycemic goals (A1c <6.0%) may further reduce complications at the cost of increased risk of hypoglycemia.    Hgb A1c MFr Bld  Date Value Ref Range Status  02/08/2023 6.2 (H) 4.8 - 5.6 % Final    Comment:             Prediabetes: 5.7 - 6.4          Diabetes: >6.4          Glycemic control for adults with diabetes: <7.0          Passed - Valid encounter within last 6 months    Recent Outpatient Visits   None     Future Appointments             In 1 month Aileen Alexanders, NP [facility]            Passed - CBC within normal limits and completed in the last 12 months    WBC  Date Value Ref Range Status  11/05/2022 5.1 3.4 - 10.8 x10E3/uL Final  02/23/2011 5.2 3.6 - 11.0 x10 3/mm  Final   RBC  Date Value Ref Range Status  11/05/2022 3.18 (L) 3.77 - 5.28 x10E6/uL Final    Comment:    Ovalocytes present. CBC results reported were obtained after the specimen had been warmed to 37 degrees C.  This may indicate the presence of Cold Agglutinins.   02/23/2011 3.52 (L) 3.80 - 5.20 x10 6/mm  Final   Hemoglobin  Date Value Ref Range Status  11/05/2022 10.7 (L) 11.1 - 15.9 g/dL Final   Hematocrit  Date Value Ref Range Status  11/05/2022 33.2 (L) 34.0 - 46.6 % Final   MCHC  Date Value Ref Range Status   11/05/2022 32.2 31.5 - 35.7 g/dL Final  54/09/8117 14.7 32.0 - 36.0 g/dL Final   [facility]  Date Value Ref Range Status  11/05/2022 33.6 (H) 26.6 - 33.0 pg Final  02/23/2011 34.6 (H) 26.0 - 34.0 pg Final   MCV  Date Value Ref Range Status  11/05/2022 104 (H) 79 - 97 fL Final  02/23/2011 100 80 - 100 fL Final   No results found for: "PLTCOUNTKUC", "LABPLAT", "POCPLA" RDW  Date Value Ref Range Status  11/05/2022 12.8 11.7 - 15.4 % Final  02/23/2011 13.8 11.5 - 14.5 % Final          levothyroxine  (SYNTHROID ) 75 MCG tablet [Pharmacy Med Name: levothyroxine  75 mcg tablet] 90 tablet 1    Sig: TAKE ONE TABLET BY MOUTH EVERY MORNING BEFORE BREAKFAST     Endocrinology:  Hypothyroid Agents Passed - 05/06/2023  1:14 PM      Passed - TSH in normal range and within 360 days    TSH  Date Value Ref Range Status  02/08/2023 0.985 0.450 - 4.500 uIU/mL Final         Passed - Valid encounter within last 12 months    Recent Outpatient Visits   None     Future Appointments             In 1 month Aileen Alexanders, NP [place] [facility]             lisinopril  (ZESTRIL ) 10 MG tablet [Pharmacy Med Name: lisinopril  10 mg tablet] 90 tablet 0    Sig: TAKE ONE TABLET BY MOUTH ONCE DAILY  Cardiovascular:  ACE Inhibitors Failed - 05/06/2023  1:14 PM      Failed - Cr in normal range and within 180 days    Creatinine  Date Value Ref Range Status  02/23/2011 0.88 0.60 - 1.30 mg/dL Final   Creatinine, Ser  Date Value Ref Range Status  02/08/2023 1.04 (H) 0.57 - 1.00 mg/dL Final         Passed - K in normal range and within 180 days    Potassium  Date Value Ref Range Status  02/08/2023 4.7 3.5 - 5.2 mmol/L Final         Passed - Patient is not pregnant      Passed - Last BP in normal range    BP Readings from Last 1 Encounters:  02/08/23 110/68         Passed - Valid encounter within last 6 months    Recent Outpatient Visits   None     Future  Appointments             In 1 month Aileen Alexanders, NP [facility], [facility]

## 2023-06-01 ENCOUNTER — Other Ambulatory Visit: Payer: Self-pay | Admitting: Nurse Practitioner

## 2023-06-02 NOTE — Telephone Encounter (Signed)
 Requested Prescriptions  Pending Prescriptions Disp Refills   Semaglutide ,0.25 or 0.5MG /DOS, (OZEMPIC , 0.25 OR 0.5 MG/DOSE,) 2 MG/3ML SOPN [Pharmacy Med Name: Ozempic  (0.25 or 0.5 MG/DOSE) Subcutaneous Solution Pen-injector 2 MG/3ML] 3 mL 2    Sig: INJECT 0.5MG  UNDER THE SKIN ONE TIME WEEKLY     Endocrinology:  Diabetes - GLP-1 Receptor Agonists - semaglutide  Failed - 06/02/2023  2:01 PM      Failed - HBA1C in normal range and within 180 days    Hemoglobin A1C  Date Value Ref Range Status  11/24/2015 7.2%  Final   HB A1C (BAYER DCA - WAIVED)  Date Value Ref Range Status  12/26/2019 6.2 <7.0 % Final    Comment:                                          Diabetic Adult            <7.0                                       Healthy Adult        4.3 - 5.7                                                           (DCCT/NGSP) American Diabetes Association's Summary of Glycemic Recommendations for Adults with Diabetes: Hemoglobin A1c <7.0%. More stringent glycemic goals (A1c <6.0%) may further reduce complications at the cost of increased risk of hypoglycemia.    Hgb A1c MFr Bld  Date Value Ref Range Status  02/08/2023 6.2 (H) 4.8 - 5.6 % Final    Comment:             Prediabetes: 5.7 - 6.4          Diabetes: >6.4          Glycemic control for adults with diabetes: <7.0          Failed - Cr in normal range and within 360 days    Creatinine  Date Value Ref Range Status  02/23/2011 0.88 0.60 - 1.30 mg/dL Final   Creatinine, Ser  Date Value Ref Range Status  02/08/2023 1.04 (H) 0.57 - 1.00 mg/dL Final         Passed - Valid encounter within last 6 months    Recent Outpatient Visits   None     Future Appointments             In 6 days Aileen Alexanders, NP [facility]

## 2023-06-08 ENCOUNTER — Ambulatory Visit (INDEPENDENT_AMBULATORY_CARE_PROVIDER_SITE_OTHER): Payer: Self-pay | Admitting: Nurse Practitioner

## 2023-06-08 ENCOUNTER — Encounter: Payer: Self-pay | Admitting: Nurse Practitioner

## 2023-06-08 VITALS — BP 111/63 | HR 73 | Temp 97.9°F | Resp 16 | Ht 62.01 in | Wt 127.4 lb

## 2023-06-08 DIAGNOSIS — R809 Proteinuria, unspecified: Secondary | ICD-10-CM | POA: Diagnosis not present

## 2023-06-08 DIAGNOSIS — E039 Hypothyroidism, unspecified: Secondary | ICD-10-CM

## 2023-06-08 DIAGNOSIS — E1129 Type 2 diabetes mellitus with other diabetic kidney complication: Secondary | ICD-10-CM

## 2023-06-08 DIAGNOSIS — E1169 Type 2 diabetes mellitus with other specified complication: Secondary | ICD-10-CM | POA: Diagnosis not present

## 2023-06-08 DIAGNOSIS — Z7985 Long-term (current) use of injectable non-insulin antidiabetic drugs: Secondary | ICD-10-CM

## 2023-06-08 DIAGNOSIS — N1831 Chronic kidney disease, stage 3a: Secondary | ICD-10-CM | POA: Diagnosis not present

## 2023-06-08 DIAGNOSIS — E1159 Type 2 diabetes mellitus with other circulatory complications: Secondary | ICD-10-CM

## 2023-06-08 DIAGNOSIS — I152 Hypertension secondary to endocrine disorders: Secondary | ICD-10-CM

## 2023-06-08 DIAGNOSIS — E538 Deficiency of other specified B group vitamins: Secondary | ICD-10-CM

## 2023-06-08 DIAGNOSIS — E785 Hyperlipidemia, unspecified: Secondary | ICD-10-CM

## 2023-06-08 DIAGNOSIS — E119 Type 2 diabetes mellitus without complications: Secondary | ICD-10-CM | POA: Insufficient documentation

## 2023-06-08 DIAGNOSIS — I700 Atherosclerosis of aorta: Secondary | ICD-10-CM

## 2023-06-08 MED ORDER — LANCET DEVICE MISC: 1.0000 | Freq: Three times a day (TID) | 1 refills | Status: AC

## 2023-06-08 MED ORDER — GABAPENTIN 300 MG PO CAPS: 300.0000 mg | ORAL_CAPSULE | Freq: Three times a day (TID) | ORAL | 1 refills | Status: AC

## 2023-06-08 MED ORDER — LANCETS MISC. MISC
1.0000 | Freq: Three times a day (TID) | 0 refills | Status: DC
Start: 2023-06-08 — End: 2023-06-08

## 2023-06-08 MED ORDER — RALOXIFENE HCL 60 MG PO TABS: 60.0000 mg | ORAL_TABLET | Freq: Every day | ORAL | 2 refills | Status: DC

## 2023-06-08 MED ORDER — BLOOD GLUCOSE TEST VI STRP: 1.0000 | ORAL_STRIP | Freq: Three times a day (TID) | 1 refills | Status: AC

## 2023-06-08 MED ORDER — LANCETS MISC. MISC: 1.0000 | Freq: Three times a day (TID) | 1 refills | Status: AC

## 2023-06-08 MED ORDER — LISINOPRIL 10 MG PO TABS: 10.0000 mg | ORAL_TABLET | Freq: Every day | ORAL | 1 refills | Status: DC

## 2023-06-08 MED ORDER — METFORMIN HCL ER 500 MG PO TB24: ORAL_TABLET | ORAL | 1 refills | Status: DC

## 2023-06-08 MED ORDER — BLOOD GLUCOSE TEST VI STRP
1.0000 | ORAL_STRIP | Freq: Three times a day (TID) | 0 refills | Status: DC
Start: 2023-06-08 — End: 2023-06-08

## 2023-06-08 MED ORDER — BLOOD GLUCOSE MONITORING SUPPL DEVI
1.0000 | Freq: Three times a day (TID) | 1 refills | Status: DC
Start: 2023-06-08 — End: 2023-08-08

## 2023-06-08 MED ORDER — LANCET DEVICE MISC
1.0000 | Freq: Three times a day (TID) | 0 refills | Status: DC
Start: 2023-06-08 — End: 2023-06-08

## 2023-06-08 MED ORDER — BLOOD GLUCOSE MONITORING SUPPL DEVI: 1.0000 | Freq: Three times a day (TID) | 0 refills | Status: DC

## 2023-06-08 MED ORDER — LEVOTHYROXINE SODIUM 75 MCG PO TABS: ORAL_TABLET | ORAL | 1 refills | Status: DC

## 2023-06-08 NOTE — Assessment & Plan Note (Signed)
 Labs ordered at visit today.  Will make recommendations based on lab results.

## 2023-06-08 NOTE — Assessment & Plan Note (Signed)
 Chronic.  Controlled.  Continue with current medication regimen of Lisinopril 10mg .  Refills sent today.  Labs ordered today.  Return to clinic in 6 months for reevaluation.  Call sooner if concerns arise.

## 2023-06-08 NOTE — Assessment & Plan Note (Signed)
 Noted on CT imaging 07/01/2017.  Continue pravastatin and ASA daily for prevention + good diabetes control.  Continue with Pravastatin.

## 2023-06-08 NOTE — Progress Notes (Signed)
 BP 111/63 (BP Location: Left Arm, Patient Position: Sitting, Cuff Size: Normal)   Pulse 73   Temp 97.9 F (36.6 C) (Oral)   Resp 16   Ht 5' 2.01" (1.575 m)   Wt 127 lb 6.4 oz (57.8 kg)   LMP  (LMP Unknown)   SpO2 98%   BMI 23.30 kg/m    Subjective:    Patient ID: Emma Berry, female    DOB: June 29, 1950, 73 y.o.   MRN: [id number]   HPI: Emma Berry is a 73 y.o. female  Chief Complaint  Patient presents with   Diabetes    Once daily checks ranges 100-120 fasting am.  Ozempic  doing well and wonders if it can be cut back down to 0.25 mg. Has no appetite and some diarrhea.     Chronic Kidney Disease   Hypertension    Checks at home 110-120/60-70.    HYPERTENSION / HYPERLIPIDEMIA Satisfied with current treatment? yes Duration of hypertension: years BP monitoring frequency: weekly BP range: 110-120/60-70 BP medication side effects: no Past BP meds: quinapril  Duration of hyperlipidemia: years Cholesterol medication side effects: no Cholesterol supplements: none Past cholesterol medications: pravastatin  (pravachol ) Medication compliance: excellent compliance Aspirin: yes Recent stressors: no Recurrent headaches: no Visual changes: no Palpitations: no Dyspnea: no Chest pain: no Lower extremity edema: no Dizzy/lightheaded: no  HYPOTHYROIDISM Thyroid  control status:controlled Satisfied with current treatment? yes Medication side effects: no Medication compliance: excellent compliance Etiology of hypothyroidism:  Recent dose adjustment:no Fatigue: no Cold intolerance: no Heat intolerance: no Weight gain: no Weight loss: no Constipation: no Diarrhea/loose stools: no Palpitations: no Lower extremity edema: no Anxiety/depressed mood: no  CHRONIC KIDNEY DISEASE CKD status: controlled Medications renally dose: no Previous renal evaluation: no Pneumovax:  Up to Date Influenza Vaccine:  Up to Date  DIABETES Paitent has lost 30lbs since her last visit.  She  is taking Ozempic  0.5mg .  She is doing well have has seen her appetite decrease.  Wondering if she can cut back to 0.25mg  so her appetite improves.  Hypoglycemic episodes:no symptoms Polydipsia/polyuria: no Visual disturbance: no Chest pain: no Paresthesias: no Glucose Monitoring: no  Accucheck frequency: Daily  Fasting glucose: 110-120  Post prandial:  Evening:  Before meals: Taking Insulin ?: no  Long acting insulin :  Short acting insulin : Blood Pressure Monitoring: weekly Retinal Examination: Up to Date Foot Exam: Completed this visit Diabetic Education: Not Completed Pneumovax: Up to Date Influenza:Completed this visit Aspirin: yes     Relevant past medical, surgical, family and social history reviewed and updated as indicated. Interim medical history since our last visit reviewed. Allergies and medications reviewed and updated.  Review of Systems  Constitutional:  Negative for activity change, appetite change, chills and fever.  Eyes:  Negative for visual disturbance.  Respiratory:  Negative for cough, chest tightness and shortness of breath.   Cardiovascular:  Negative for chest pain, palpitations and leg swelling.  Gastrointestinal:  Negative for constipation and diarrhea.  Endocrine: Negative for polydipsia and polyuria.  Neurological:  Negative for dizziness, light-headedness, numbness and headaches.  Psychiatric/Behavioral:  Negative for confusion, sleep disturbance and suicidal ideas.     Per HPI unless specifically indicated above     Objective:    BP 111/63 (BP Location: Left Arm, Patient Position: Sitting, Cuff Size: Normal)   Pulse 73   Temp 97.9 F (36.6 C) (Oral)   Resp 16   Ht 5' 2.01" (1.575 m)   Wt 127 lb 6.4 oz (57.8 kg)   LMP  (  LMP Unknown)   SpO2 98%   BMI 23.30 kg/m   Wt Readings from Last 3 Encounters:  06/08/23 127 lb 6.4 oz (57.8 kg)  04/26/23 135 lb (61.2 kg)  02/08/23 146 lb 9.6 oz (66.5 kg)    Physical Exam Vitals and nursing  note reviewed.  Constitutional:      General: She is not in acute distress.    Appearance: Normal appearance. She is normal weight. She is not ill-appearing, toxic-appearing or diaphoretic.  HENT:     Head: Normocephalic.     Right Ear: External ear normal.     Left Ear: External ear normal.     Nose: Nose normal.     Mouth/Throat:     Mouth: Mucous membranes are moist.     Pharynx: Oropharynx is clear.  Eyes:     General:        Right eye: No discharge.        Left eye: No discharge.     Extraocular Movements: Extraocular movements intact.     Conjunctiva/sclera: Conjunctivae normal.     Pupils: Pupils are equal, round, and reactive to light.  Cardiovascular:     Rate and Rhythm: Normal rate and regular rhythm.     Heart sounds: No murmur heard. Pulmonary:     Effort: Pulmonary effort is normal. No respiratory distress.     Breath sounds: Normal breath sounds. No wheezing or rales.  Musculoskeletal:     Cervical back: Normal range of motion and neck supple.  Skin:    General: Skin is warm and dry.     Capillary Refill: Capillary refill takes less than 2 seconds.  Neurological:     General: No focal deficit present.     Mental Status: She is alert and oriented to person, place, and time. Mental status is at baseline.  Psychiatric:        Mood and Affect: Mood normal.        Behavior: Behavior normal.        Thought Content: Thought content normal.        Judgment: Judgment normal.     Results for orders placed or performed in visit on 02/08/23  Microalbumin, Urine Waived   Collection Time: 02/08/23  9:10 AM  Result Value Ref Range   Microalb, Ur Waived 30 (H) 0 - 19 mg/L   Creatinine, Urine Waived 100 10 - 300 mg/dL   Microalb/Creat Ratio <30 <30 mg/g  Hemoglobin A1c   Collection Time: 02/08/23  9:11 AM  Result Value Ref Range   Hgb A1c MFr Bld 6.2 (H) 4.8 - 5.6 %   Est. average glucose Bld gHb Est-mCnc 131 mg/dL  Lipid Panel w/o Chol/HDL Ratio   Collection Time:  02/08/23  9:11 AM  Result Value Ref Range   Cholesterol, Total 122 100 - 199 mg/dL   Triglycerides 92 0 - 149 mg/dL   HDL 47 >16 mg/dL   VLDL Cholesterol Cal 18 5 - 40 mg/dL   LDL Chol Calc (NIH) 57 0 - 99 mg/dL  Comprehensive metabolic panel   Collection Time: 02/08/23  9:11 AM  Result Value Ref Range   Glucose 76 70 - 99 mg/dL   BUN 20 8 - 27 mg/dL   Creatinine, Ser 1.09 (H) 0.57 - 1.00 mg/dL   eGFR 56 (L) >60 AV/WUJ/8.11   BUN/Creatinine Ratio 19 12 - 28   Sodium 137 134 - 144 mmol/L   Potassium 4.7 3.5 - 5.2 mmol/L   Chloride  102 96 - 106 mmol/L   CO2 24 20 - 29 mmol/L   Calcium 9.5 8.7 - 10.3 mg/dL   Total Protein 6.5 6.0 - 8.5 g/dL   Albumin 3.9 3.8 - 4.8 g/dL   Globulin, Total 2.6 1.5 - 4.5 g/dL   Bilirubin Total 0.3 0.0 - 1.2 mg/dL   Alkaline Phosphatase 74 44 - 121 IU/L   AST 14 0 - 40 IU/L   ALT 7 0 - 32 IU/L  TSH   Collection Time: 02/08/23  9:11 AM  Result Value Ref Range   TSH 0.985 0.450 - 4.500 uIU/mL  T4, free   Collection Time: 02/08/23  9:11 AM  Result Value Ref Range   Free T4 1.77 0.82 - 1.77 ng/dL  R60   Collection Time: 02/08/23  9:11 AM  Result Value Ref Range   Vitamin B-12 1,384 (H) 232 - 1,245 pg/mL      Assessment & Plan:   Problem List Items Addressed This Visit       Cardiovascular and Mediastinum   Hypertension associated with diabetes (HCC)   Chronic.  Controlled.  Continue with current medication regimen of Lisinopril  10mg .  Refills sent today.  Labs ordered today.  Return to clinic in 6 months for reevaluation.  Call sooner if concerns arise.       Relevant Medications   lisinopril  (ZESTRIL ) 10 MG tablet   metFORMIN  (GLUCOPHAGE -XR) 500 MG 24 hr tablet   Aortic atherosclerosis (HCC)   Noted on CT imaging 07/01/2017.  Continue pravastatin  and ASA daily for prevention + good diabetes control.  Continue with Pravastatin .      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  Controlled at 6.4%.  Doing well with Ozempic .  Has lost 30lbs since starting Ozempic .  Suspect her Now off Pioglitazone  and Glyburide .  Continue with current medication regimen on Metformin  and Ozempic . Refills sent today.   Sugars running 110 fasting.  Labs ordered today.  Return to clinic in 3 months for reevaluation.  Call sooner if concerns arise.       Relevant Medications   Blood Glucose Monitoring Suppl DEVI   Glucose Blood (BLOOD GLUCOSE TEST STRIPS) STRP   Lancet Device MISC   Lancets Misc. MISC   lisinopril  (ZESTRIL ) 10 MG tablet   metFORMIN  (GLUCOPHAGE -XR) 500 MG 24 hr tablet   Other Relevant Orders   Hemoglobin A1c   Comprehensive metabolic panel with GFR   Hyperlipidemia associated with type 2 diabetes mellitus (HCC)   Chronic.  Controlled.  Continue with current medication regimen on Pravastatin  40mg .  Refills sent today.  Labs ordered today.  Return to clinic in 6 months for reevaluation.  Call sooner if concerns arise.       Relevant Medications   lisinopril  (ZESTRIL ) 10 MG tablet   metFORMIN  (GLUCOPHAGE -XR) 500 MG 24 hr tablet   Other Relevant Orders   Lipid panel   Hypothyroidism   Chronic. Will make recommendations based on lab results. Continue with current dose of Levothyroxine .  Refills sent today.  Follow up in 6 months.  Call sooner if concerns arise.       Relevant Medications   levothyroxine  (SYNTHROID ) 75 MCG tablet   Other Relevant Orders   TSH   T4, free   Diabetes mellitus treated with injections of non-insulin  medication (HCC)   Relevant Medications   lisinopril  (ZESTRIL ) 10 MG  tablet   metFORMIN  (GLUCOPHAGE -XR) 500 MG 24 hr tablet     Genitourinary   CKD (chronic kidney disease) stage 3, GFR 30-59 ml/min (HCC)   Chronic.  Stable.  On ACE and GLP1.  Continue with current medication regimen.  Labs ordered at visit today.  Will make recommendations based on lab results.         Other   B12 deficiency    Labs ordered at visit today.  Will make recommendations based on lab results.        Relevant Orders   B12     Follow up plan: Return in about 3 months (around 09/08/2023) for HTN, HLD, DM2 FU.

## 2023-06-08 NOTE — Assessment & Plan Note (Signed)
 Chronic. Will make recommendations based on lab results. Continue with current dose of Levothyroxine.  Refills sent today.  Follow up in 6 months.  Call sooner if concerns arise.

## 2023-06-08 NOTE — Assessment & Plan Note (Addendum)
 Chronic.  Controlled at 6.4%.  Doing well with Ozempic .  Has lost 30lbs since starting Ozempic .  Suspect her Now off Pioglitazone  and Glyburide .  Continue with current medication regimen on Metformin  and Ozempic . Refills sent today.   Sugars running 110 fasting.  Labs ordered today.  Return to clinic in 3 months for reevaluation.  Call sooner if concerns arise.

## 2023-06-08 NOTE — Assessment & Plan Note (Signed)
 Chronic.  Stable.  On ACE and GLP1.  Continue with current medication regimen.  Labs ordered at visit today.  Will make recommendations based on lab results.

## 2023-06-08 NOTE — Assessment & Plan Note (Signed)
 Chronic.  Controlled.  Continue with current medication regimen on Pravastatin 40mg .  Refills sent today.  Labs ordered today.  Return to clinic in 6 months for reevaluation.  Call sooner if concerns arise.

## 2023-06-09 ENCOUNTER — Ambulatory Visit: Payer: Self-pay | Admitting: Nurse Practitioner

## 2023-06-09 ENCOUNTER — Encounter: Payer: Self-pay | Admitting: Nurse Practitioner

## 2023-06-09 LAB — COMPREHENSIVE METABOLIC PANEL WITH GFR
ALT: 5 IU/L (ref 0–32)
AST: 11 IU/L (ref 0–40)
Albumin: 4.1 g/dL (ref 3.8–4.8)
Alkaline Phosphatase: 79 IU/L (ref 44–121)
BUN/Creatinine Ratio: 16 (ref 12–28)
BUN: 15 mg/dL (ref 8–27)
Bilirubin Total: 0.3 mg/dL (ref 0.0–1.2)
CO2: 22 mmol/L (ref 20–29)
Calcium: 9.3 mg/dL (ref 8.7–10.3)
Chloride: 103 mmol/L (ref 96–106)
Creatinine, Ser: 0.91 mg/dL (ref 0.57–1.00)
Globulin, Total: 2.4 g/dL (ref 1.5–4.5)
Glucose: 125 mg/dL — ABNORMAL HIGH (ref 70–99)
Potassium: 4.9 mmol/L (ref 3.5–5.2)
Sodium: 139 mmol/L (ref 134–144)
Total Protein: 6.5 g/dL (ref 6.0–8.5)
eGFR: 66 mL/min/{1.73_m2} (ref >59)

## 2023-06-09 LAB — HEMOGLOBIN A1C
Est. average glucose Bld gHb Est-mCnc: 174 mg/dL
Hgb A1c MFr Bld: 7.7 % — ABNORMAL HIGH (ref 4.8–5.6)

## 2023-06-09 LAB — LIPID PANEL
Chol/HDL Ratio: 2.6 ratio (ref 0.0–4.4)
Cholesterol, Total: 120 mg/dL (ref 100–199)
HDL: 47 mg/dL (ref >39)
LDL Chol Calc (NIH): 56 mg/dL (ref 0–99)
Triglycerides: 91 mg/dL (ref 0–149)
VLDL Cholesterol Cal: 17 mg/dL (ref 5–40)

## 2023-06-09 LAB — VITAMIN B12: Vitamin B-12: 1441 pg/mL — ABNORMAL HIGH (ref 232–1245)

## 2023-06-09 LAB — T4, FREE: Free T4: 1.8 ng/dL — ABNORMAL HIGH (ref 0.82–1.77)

## 2023-06-09 LAB — TSH: TSH: 1.12 u[IU]/mL (ref 0.450–4.500)

## 2023-06-16 ENCOUNTER — Other Ambulatory Visit: Payer: Self-pay | Admitting: Nurse Practitioner

## 2023-06-17 NOTE — Telephone Encounter (Signed)
 Requested Prescriptions  Refused Prescriptions Disp Refills   metFORMIN  (GLUCOPHAGE -XR) 500 MG 24 hr tablet [Pharmacy Med Name: metformin  ER 500 mg tablet,extended release 24 hr] 360 tablet 1    Sig: TAKE TWO TABLETS BY MOUTH TWICE DAILY morning AND bedtime     Endocrinology:  Diabetes - Biguanides Failed - 06/17/2023 10:42 AM      Failed - B12 Level in normal range and within 720 days    Vitamin B-12  Date Value Ref Range Status  06/08/2023 1,441 (H) 232 - 1,245 pg/mL Final         Passed - Cr in normal range and within 360 days    Creatinine  Date Value Ref Range Status  02/23/2011 0.88 0.60 - 1.30 mg/dL Final   Creatinine, Ser  Date Value Ref Range Status  06/08/2023 0.91 0.57 - 1.00 mg/dL Final         Passed - HBA1C is between 0 and 7.9 and within 180 days    Hemoglobin A1C  Date Value Ref Range Status  11/24/2015 7.2%  Final   HB A1C (BAYER DCA - WAIVED)  Date Value Ref Range Status  12/26/2019 6.2 <7.0 % Final    Comment:                                          Diabetic Adult            <7.0                                       Healthy Adult        4.3 - 5.7                                                           (DCCT/NGSP) American Diabetes Association's Summary of Glycemic Recommendations for Adults with Diabetes: Hemoglobin A1c <7.0%. More stringent glycemic goals (A1c <6.0%) may further reduce complications at the cost of increased risk of hypoglycemia.    Hgb A1c MFr Bld  Date Value Ref Range Status  06/08/2023 7.7 (H) 4.8 - 5.6 % Final    Comment:             Prediabetes: 5.7 - 6.4          Diabetes: >6.4          Glycemic control for adults with diabetes: <7.0          Passed - eGFR in normal range and within 360 days    EGFR (African American)  Date Value Ref Range Status  02/23/2011 >60 >101mL/min Final   GFR calc Af Amer  Date Value Ref Range Status  12/26/2019 64 >59 mL/min/1.73 Final    Comment:    **In accordance with recommendations  from the NKF-ASN Task force,**   [facility] is in the process of updating its eGFR calculation to the   2021 CKD-EPI creatinine equation that estimates kidney function   without a race variable.    EGFR (Non-African Amer.)  Date Value Ref Range Status  02/23/2011 >60 >45mL/min Final    Comment:    eGFR values <  70mL/min/1.73 m2 may be an indication of chronic kidney disease (CKD). Calculated eGFR, using the MRDR Study equation, is useful in  patients with stable renal function. The eGFR calculation will not be reliable in acutely ill patients when serum creatinine is changing rapidly. It is not useful in patients on dialysis. The eGFR calculation may not be applicable to patients at the low and high extremes of body sizes, pregnant women, and vegetarians.    GFR calc non Af Amer  Date Value Ref Range Status  12/26/2019 55 (L) >59 mL/min/1.73 Final   eGFR  Date Value Ref Range Status  06/08/2023 66 >59 mL/min/1.73 Final         Passed - Valid encounter within last 6 months    Recent Outpatient Visits           1 week ago Type 2 diabetes mellitus with proteinuria (HCC)   [place] [facility] Aileen Alexanders, NP              Passed - CBC within normal limits and completed in the last 12 months    WBC  Date Value Ref Range Status  11/05/2022 5.1 3.4 - 10.8 x10E3/uL Final  02/23/2011 5.2 3.6 - 11.0 x10 3/mm  Final   RBC  Date Value Ref Range Status  11/05/2022 3.18 (L) 3.77 - 5.28 x10E6/uL Final    Comment:    Ovalocytes present. CBC results reported were obtained after the specimen had been warmed to 37 degrees C.  This may indicate the presence of Cold Agglutinins.   02/23/2011 3.52 (L) 3.80 - 5.20 x10 6/mm  Final   Hemoglobin  Date Value Ref Range Status  11/05/2022 10.7 (L) 11.1 - 15.9 g/dL Final   Hematocrit  Date Value Ref Range Status  11/05/2022 33.2 (L) 34.0 - 46.6 % Final   MCHC  Date Value Ref Range Status  11/05/2022 32.2 31.5 -  35.7 g/dL Final  84/13/2440 10.2 32.0 - 36.0 g/dL Final   [facility]  Date Value Ref Range Status  11/05/2022 33.6 (H) 26.6 - 33.0 pg Final  02/23/2011 34.6 (H) 26.0 - 34.0 pg Final   MCV  Date Value Ref Range Status  11/05/2022 104 (H) 79 - 97 fL Final  02/23/2011 100 80 - 100 fL Final   No results found for: "PLTCOUNTKUC", "LABPLAT", "POCPLA" RDW  Date Value Ref Range Status  11/05/2022 12.8 11.7 - 15.4 % Final  02/23/2011 13.8 11.5 - 14.5 % Final

## 2023-07-25 ENCOUNTER — Other Ambulatory Visit: Payer: Self-pay | Admitting: Nurse Practitioner

## 2023-07-26 NOTE — Telephone Encounter (Signed)
 Requested medication (s) are due for refill today: yes  Requested medication (s) are on the active medication list: yes  Last refill:  02/21/23  Future visit scheduled: no  Notes to clinic:  Unable to refill per protocol, cannot delegate.      Requested Prescriptions  Pending Prescriptions Disp Refills   glyBURIDE  (DIABETA ) 5 MG tablet [Pharmacy Med Name: glyBURIDE  Oral Tablet 5 MG] 90 tablet 3    Sig: Take 1 tablet (5 mg total) by mouth daily with breakfast.     Endocrinology:  Diabetes - Sulfonylureas - glyburide  Failed - 07/26/2023  4:07 PM      Failed - HBA1C in normal range and within 180 days    Hemoglobin A1C  Date Value Ref Range Status  11/24/2015 7.2%  Final   HB A1C (BAYER DCA - WAIVED)  Date Value Ref Range Status  12/26/2019 6.2 <7.0 % Final    Comment:                                          Diabetic Adult            <7.0                                       Healthy Adult        4.3 - 5.7                                                           (DCCT/NGSP) American Diabetes Association's Summary of Glycemic Recommendations for Adults with Diabetes: Hemoglobin A1c <7.0%. More stringent glycemic goals (A1c <6.0%) may further reduce complications at the cost of increased risk of hypoglycemia.    Hgb A1c MFr Bld  Date Value Ref Range Status  06/08/2023 7.7 (H) 4.8 - 5.6 % Final    Comment:             Prediabetes: 5.7 - 6.4          Diabetes: >6.4          Glycemic control for adults with diabetes: <7.0          Passed - Cr in normal range and within 360 days    Creatinine  Date Value Ref Range Status  02/23/2011 0.88 0.60 - 1.30 mg/dL Final   Creatinine, Ser  Date Value Ref Range Status  06/08/2023 0.91 0.57 - 1.00 mg/dL Final         Passed - eGFR is 60 or above and within 360 days    EGFR (African American)  Date Value Ref Range Status  02/23/2011 >60 >14mL/min Final   GFR calc Af Amer  Date Value Ref Range Status  12/26/2019 64 >59  mL/min/1.73 Final    Comment:    **In accordance with recommendations from the NKF-ASN Task force,**   [facility] is in the process of updating its eGFR calculation to the   2021 CKD-EPI creatinine equation that estimates kidney function   without a race variable.    EGFR (Non-African Amer.)  Date Value Ref Range Status  02/23/2011 >60 >72mL/min Final  Comment:    eGFR values <54mL/min/1.73 m2 may be an indication of chronic kidney disease (CKD). Calculated eGFR, using the MRDR Study equation, is useful in  patients with stable renal function. The eGFR calculation will not be reliable in acutely ill patients when serum creatinine is changing rapidly. It is not useful in patients on dialysis. The eGFR calculation may not be applicable to patients at the low and high extremes of body sizes, pregnant women, and vegetarians.    GFR calc non Af Amer  Date Value Ref Range Status  12/26/2019 55 (L) >59 mL/min/1.73 Final   eGFR  Date Value Ref Range Status  06/08/2023 66 >59 mL/min/1.73 Final         Passed - Valid encounter within last 6 months    Recent Outpatient Visits           1 month ago Type 2 diabetes mellitus with proteinuria [facility])   [place] [facility] Melvin Pao, NP

## 2023-07-31 ENCOUNTER — Other Ambulatory Visit: Payer: Self-pay | Admitting: Nurse Practitioner

## 2023-08-02 NOTE — Telephone Encounter (Signed)
 Requested by interface surescripts. Future visit 09/09/23.  Requested Prescriptions  Pending Prescriptions Disp Refills   pravastatin  (PRAVACHOL ) 40 MG tablet [Pharmacy Med Name: pravastatin  40 mg tablet] 90 tablet 0    Sig: TAKE ONE TABLET BY MOUTH AT BEDTIME     Cardiovascular:  Antilipid - Statins Failed - 08/02/2023  1:37 PM      Failed - Lipid Panel in normal range within the last 12 months    Cholesterol, Total  Date Value Ref Range Status  06/08/2023 120 100 - 199 mg/dL Final   Cholesterol Piccolo, Waived  Date Value Ref Range Status  05/25/2016 121 <200 mg/dL Final    Comment:                            Desirable                <200                         Borderline High      200- 239                         High                     >239    LDL Chol Calc (NIH)  Date Value Ref Range Status  06/08/2023 56 0 - 99 mg/dL Final   HDL  Date Value Ref Range Status  06/08/2023 47 >39 mg/dL Final   Triglycerides  Date Value Ref Range Status  06/08/2023 91 0 - 149 mg/dL Final   Triglycerides Piccolo,Waived  Date Value Ref Range Status  05/25/2016 69 <150 mg/dL Final    Comment:                            Normal                   <150                         Borderline High     150 - 199                         High                200 - 499                         Very High                >499          Passed - Patient is not pregnant      Passed - Valid encounter within last 12 months    Recent Outpatient Visits           1 month ago Type 2 diabetes mellitus with proteinuria (HCC)   [place] [facility] Melvin Pao, NP               raloxifene  (EVISTA ) 60 MG tablet [Pharmacy Med Name: raloxifene  60 mg tablet] 90 tablet 0    Sig: TAKE ONE TABLET BY MOUTH ONCE DAILY     OB/GYN: Selective Estrogen Receptor Modulators 2 Passed - 08/02/2023  1:37 PM  Passed - Ca in normal range and within 360 days    Calcium  Date Value Ref Range  Status  06/08/2023 9.3 8.7 - 10.3 mg/dL Final         Passed - Valid encounter within last 12 months    Recent Outpatient Visits           1 month ago Type 2 diabetes mellitus with proteinuria [facility])   [place] [facility] Melvin Pao, NP              Passed - Bone Mineral Density or Dexa Scan completed in the last 2 years

## 2023-08-05 ENCOUNTER — Other Ambulatory Visit: Payer: Self-pay | Admitting: Nurse Practitioner

## 2023-08-05 DIAGNOSIS — E1129 Type 2 diabetes mellitus with other diabetic kidney complication: Secondary | ICD-10-CM

## 2023-08-08 NOTE — Telephone Encounter (Signed)
 Requested Prescriptions  Pending Prescriptions Disp Refills   TRUEplus Lancets 33G MISC [Pharmacy Med Name: TRUEPLUS LANCETS 33G] 200 each     Sig: TEST BLOOD SUGAR IN THE MORNING, AT NOON, AND AT BEDTIME     Endocrinology: Diabetes - Testing Supplies Passed - 08/08/2023 10:37 AM      Passed - Valid encounter within last 12 months    Recent Outpatient Visits           2 months ago Type 2 diabetes mellitus with proteinuria (HCC)   [place] [facility] Melvin Pao, NP               Blood Glucose Monitoring Suppl DEVI [Pharmacy Med Name: TRUE METRIX SELF MONITORI] 200 strip     Sig: TEST BLOOD SUGAR IN THE MORNING, AT NOON, AND AT BEDTIME     Endocrinology: Diabetes - Testing Supplies Passed - 08/08/2023 10:37 AM      Passed - Valid encounter within last 12 months    Recent Outpatient Visits           2 months ago Type 2 diabetes mellitus with proteinuria [facility])   [place] [facility] Melvin Pao, NP

## 2023-08-11 ENCOUNTER — Other Ambulatory Visit: Payer: Self-pay | Admitting: Nurse Practitioner

## 2023-08-11 NOTE — Telephone Encounter (Signed)
 Requested Prescriptions  Pending Prescriptions Disp Refills   Semaglutide ,0.25 or 0.5MG /DOS, (OZEMPIC , 0.25 OR 0.5 MG/DOSE,) 2 MG/3ML SOPN [Pharmacy Med Name: OZEMPIC  2 MG/3ML Subcutaneous Solution Pen-injector] 2 mL 3    Sig: INJECT 0.5MG  UNDER THE SKIN ONE TIME WEEKLY     Endocrinology:  Diabetes - GLP-1 Receptor Agonists - semaglutide  Failed - 08/11/2023  4:18 PM      Failed - HBA1C in normal range and within 180 days    Hemoglobin A1C  Date Value Ref Range Status  11/24/2015 7.2%  Final   HB A1C (BAYER DCA - WAIVED)  Date Value Ref Range Status  12/26/2019 6.2 <7.0 % Final    Comment:                                          Diabetic Adult            <7.0                                       Healthy Adult        4.3 - 5.7                                                           (DCCT/NGSP) American Diabetes Association's Summary of Glycemic Recommendations for Adults with Diabetes: Hemoglobin A1c <7.0%. More stringent glycemic goals (A1c <6.0%) may further reduce complications at the cost of increased risk of hypoglycemia.    Hgb A1c MFr Bld  Date Value Ref Range Status  06/08/2023 7.7 (H) 4.8 - 5.6 % Final    Comment:             Prediabetes: 5.7 - 6.4          Diabetes: >6.4          Glycemic control for adults with diabetes: <7.0          Passed - Cr in normal range and within 360 days    Creatinine  Date Value Ref Range Status  02/23/2011 0.88 0.60 - 1.30 mg/dL Final   Creatinine, Ser  Date Value Ref Range Status  06/08/2023 0.91 0.57 - 1.00 mg/dL Final         Passed - Valid encounter within last 6 months    Recent Outpatient Visits           2 months ago Type 2 diabetes mellitus with proteinuria [facility])   [place] [facility] Melvin Pao, NP

## 2023-09-09 ENCOUNTER — Ambulatory Visit (INDEPENDENT_AMBULATORY_CARE_PROVIDER_SITE_OTHER): Admitting: Nurse Practitioner

## 2023-09-09 ENCOUNTER — Encounter: Payer: Self-pay | Admitting: Nurse Practitioner

## 2023-09-09 VITALS — BP 112/62 | HR 62 | Temp 98.7°F | Ht 62.1 in | Wt 123.8 lb

## 2023-09-09 DIAGNOSIS — E1159 Type 2 diabetes mellitus with other circulatory complications: Secondary | ICD-10-CM

## 2023-09-09 DIAGNOSIS — E1169 Type 2 diabetes mellitus with other specified complication: Secondary | ICD-10-CM | POA: Diagnosis not present

## 2023-09-09 DIAGNOSIS — I700 Atherosclerosis of aorta: Secondary | ICD-10-CM | POA: Diagnosis not present

## 2023-09-09 DIAGNOSIS — E039 Hypothyroidism, unspecified: Secondary | ICD-10-CM | POA: Diagnosis not present

## 2023-09-09 DIAGNOSIS — R809 Proteinuria, unspecified: Secondary | ICD-10-CM

## 2023-09-09 DIAGNOSIS — E785 Hyperlipidemia, unspecified: Secondary | ICD-10-CM | POA: Diagnosis not present

## 2023-09-09 DIAGNOSIS — I152 Hypertension secondary to endocrine disorders: Secondary | ICD-10-CM

## 2023-09-09 DIAGNOSIS — E1129 Type 2 diabetes mellitus with other diabetic kidney complication: Secondary | ICD-10-CM | POA: Diagnosis not present

## 2023-09-09 DIAGNOSIS — Z7985 Long-term (current) use of injectable non-insulin antidiabetic drugs: Secondary | ICD-10-CM | POA: Diagnosis not present

## 2023-09-09 DIAGNOSIS — N1831 Chronic kidney disease, stage 3a: Secondary | ICD-10-CM | POA: Diagnosis not present

## 2023-09-09 DIAGNOSIS — E119 Type 2 diabetes mellitus without complications: Secondary | ICD-10-CM | POA: Diagnosis not present

## 2023-09-09 MED ORDER — PIOGLITAZONE HCL 15 MG PO TABS: 15.0000 mg | ORAL_TABLET | Freq: Every day | ORAL | 1 refills | Status: DC

## 2023-09-09 NOTE — Addendum Note (Signed)
 Addended by: MELVIN PAO on: 09/09/2023 09:17 AM   Modules accepted: Orders

## 2023-09-09 NOTE — Assessment & Plan Note (Signed)
 Noted on CT imaging 07/01/2017.  Continue pravastatin and ASA daily for prevention + good diabetes control.  Continue with Pravastatin.

## 2023-09-09 NOTE — Progress Notes (Signed)
 BP 112/62   Pulse 62   Temp 98.7 F (37.1 C) (Oral)   Ht 5' 2.1 (1.577 m)   Wt 123 lb 12.8 oz (56.2 kg)   LMP  (LMP Unknown)   SpO2 98%   BMI 22.57 kg/m    Subjective:    Patient ID: Emma Berry, female    DOB: 12-30-1950, 73 y.o.   MRN: [id number]   HPI: Emma Berry is a 73 y.o. female  Chief Complaint  Patient presents with   Diabetes   Hyperlipidemia   Hypertension   HYPERTENSION / HYPERLIPIDEMIA Satisfied with current treatment? yes Duration of hypertension: years BP monitoring frequency: weekly BP range: 110-120/60-70 BP medication side effects: no Past BP meds: lisinopril  Duration of hyperlipidemia: years Cholesterol medication side effects: no Cholesterol supplements: none Past cholesterol medications: pravastatin  (pravachol ) Medication compliance: excellent compliance Aspirin: yes Recent stressors: no Recurrent headaches: no Visual changes: no Palpitations: no Dyspnea: no Chest pain: no Lower extremity edema: no Dizzy/lightheaded: no  HYPOTHYROIDISM Thyroid  control status:controlled Satisfied with current treatment? yes Medication side effects: no Medication compliance: excellent compliance Etiology of hypothyroidism:  Recent dose adjustment:no Fatigue: no Cold intolerance: no Heat intolerance: no Weight gain: no Weight loss: no Constipation: no Diarrhea/loose stools: no Palpitations: no Lower extremity edema: no Anxiety/depressed mood: no  CHRONIC KIDNEY DISEASE CKD status: controlled Medications renally dose: no Previous renal evaluation: no Pneumovax:  Up to Date Influenza Vaccine:  Up to Date  DIABETES Paitent states she is doing well with her Ozempic .  She is also taking Actos  and Metformin .  Hypoglycemic episodes:no symptoms Polydipsia/polyuria: no Visual disturbance: no Chest pain: no Paresthesias: no Glucose Monitoring: no  Accucheck frequency: Daily  Fasting glucose: <90  Post prandial:  Evening:  Before  meals: Taking Insulin ?: no  Long acting insulin :  Short acting insulin : Blood Pressure Monitoring: weekly Retinal Examination: Up to Date Foot Exam: Completed this visit Diabetic Education: Not Completed Pneumovax: Up to Date Influenza:Completed this visit Aspirin: yes     Relevant past medical, surgical, family and social history reviewed and updated as indicated. Interim medical history since our last visit reviewed. Allergies and medications reviewed and updated.  Review of Systems  Constitutional:  Negative for activity change, appetite change, chills and fever.  Eyes:  Negative for visual disturbance.  Respiratory:  Negative for cough, chest tightness and shortness of breath.   Cardiovascular:  Negative for chest pain, palpitations and leg swelling.  Gastrointestinal:  Negative for constipation and diarrhea.  Endocrine: Negative for polydipsia and polyuria.  Neurological:  Negative for dizziness, light-headedness, numbness and headaches.  Psychiatric/Behavioral:  Negative for confusion, sleep disturbance and suicidal ideas.     Per HPI unless specifically indicated above     Objective:    BP 112/62   Pulse 62   Temp 98.7 F (37.1 C) (Oral)   Ht 5' 2.1 (1.577 m)   Wt 123 lb 12.8 oz (56.2 kg)   LMP  (LMP Unknown)   SpO2 98%   BMI 22.57 kg/m   Wt Readings from Last 3 Encounters:  09/09/23 123 lb 12.8 oz (56.2 kg)  06/08/23 127 lb 6.4 oz (57.8 kg)  04/26/23 135 lb (61.2 kg)    Physical Exam Vitals and nursing note reviewed.  Constitutional:      General: She is not in acute distress.    Appearance: Normal appearance. She is normal weight. She is not ill-appearing, toxic-appearing or diaphoretic.  HENT:     Head: Normocephalic.  Right Ear: External ear normal.     Left Ear: External ear normal.     Nose: Nose normal.     Mouth/Throat:     Mouth: Mucous membranes are moist.     Pharynx: Oropharynx is clear.  Eyes:     General:        Right eye: No  discharge.        Left eye: No discharge.     Extraocular Movements: Extraocular movements intact.     Conjunctiva/sclera: Conjunctivae normal.     Pupils: Pupils are equal, round, and reactive to light.  Cardiovascular:     Rate and Rhythm: Normal rate and regular rhythm.     Heart sounds: No murmur heard. Pulmonary:     Effort: Pulmonary effort is normal. No respiratory distress.     Breath sounds: Normal breath sounds. No wheezing or rales.  Musculoskeletal:     Cervical back: Normal range of motion and neck supple.  Skin:    General: Skin is warm and dry.     Capillary Refill: Capillary refill takes less than 2 seconds.  Neurological:     General: No focal deficit present.     Mental Status: She is alert and oriented to person, place, and time. Mental status is at baseline.  Psychiatric:        Mood and Affect: Mood normal.        Behavior: Behavior normal.        Thought Content: Thought content normal.        Judgment: Judgment normal.     Results for orders placed or performed in visit on 06/08/23  Hemoglobin A1c   Collection Time: 06/08/23  9:13 AM  Result Value Ref Range   Hgb A1c MFr Bld 7.7 (H) 4.8 - 5.6 %   Est. average glucose Bld gHb Est-mCnc 174 mg/dL  Lipid panel   Collection Time: 06/08/23  9:13 AM  Result Value Ref Range   Cholesterol, Total 120 100 - 199 mg/dL   Triglycerides 91 0 - 149 mg/dL   HDL 47 >60 mg/dL   VLDL Cholesterol Cal 17 5 - 40 mg/dL   LDL Chol Calc (NIH) 56 0 - 99 mg/dL   Chol/HDL Ratio 2.6 0.0 - 4.4 ratio  Comprehensive metabolic panel with GFR   Collection Time: 06/08/23  9:13 AM  Result Value Ref Range   Glucose 125 (H) 70 - 99 mg/dL   BUN 15 8 - 27 mg/dL   Creatinine, Ser 9.08 0.57 - 1.00 mg/dL   eGFR 66 >40 fO/fpw/8.26   BUN/Creatinine Ratio 16 12 - 28   Sodium 139 134 - 144 mmol/L   Potassium 4.9 3.5 - 5.2 mmol/L   Chloride 103 96 - 106 mmol/L   CO2 22 20 - 29 mmol/L   Calcium 9.3 8.7 - 10.3 mg/dL   Total Protein 6.5 6.0  - 8.5 g/dL   Albumin 4.1 3.8 - 4.8 g/dL   Globulin, Total 2.4 1.5 - 4.5 g/dL   Bilirubin Total 0.3 0.0 - 1.2 mg/dL   Alkaline Phosphatase 79 44 - 121 IU/L   AST 11 0 - 40 IU/L   ALT 5 0 - 32 IU/L  B12   Collection Time: 06/08/23  9:13 AM  Result Value Ref Range   Vitamin B-12 1,441 (H) 232 - 1,245 pg/mL  TSH   Collection Time: 06/08/23  9:13 AM  Result Value Ref Range   TSH 1.120 0.450 - 4.500 uIU/mL  T4, free  Collection Time: 06/08/23  9:13 AM  Result Value Ref Range   Free T4 1.80 (H) 0.82 - 1.77 ng/dL      Assessment & Plan:   Problem List Items Addressed This Visit       Cardiovascular and Mediastinum   Hypertension associated with diabetes (HCC)   Chronic.  Controlled.  Continue with current medication regimen of Lisinopril  10mg .  Refills sent today.  Labs ordered today.  Return to clinic in 3 months for reevaluation.  Call sooner if concerns arise.       Aortic atherosclerosis (HCC)   Noted on CT imaging 07/01/2017.  Continue pravastatin  and ASA daily for prevention + good diabetes control.  Continue with Pravastatin .        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  Controlled at 7.7%.  Doing well with Ozempic .  Has lost over 30lbs since starting Ozempic .  Still on Actos  and Metformin .  Continue with current medication regimen on Metformin  and Ozempic . Refills sent today.   Sugars running 90 fasting.  Labs ordered today.  Return to clinic in 3 months for reevaluation.  Call sooner if concerns arise.       Relevant Orders   Comprehensive metabolic panel with GFR   Hyperlipidemia associated with type 2 diabetes mellitus (HCC)   Chronic.  Controlled.  Continue with current medication regimen on Pravastatin  40mg .  Refills sent today.  Labs ordered today.  Return to clinic in 6 months for reevaluation.  Call sooner if concerns arise.       Relevant Orders   Lipid panel   Hypothyroidism   Chronic. Will make recommendations  based on lab results. Continue with current dose of Levothyroxine .  Refills sent today.  Follow up in 6 months.  Call sooner if concerns arise.       Relevant Orders   TSH   T4   Diabetes mellitus treated with injections of non-insulin  medication (HCC)   Chronic.  Controlled at 7.7%.  Doing well with Ozempic .  Has lost over 30lbs since starting Ozempic .  Still on Actos  and Metformin .  Will likely need to decrease dose due to weight loss.  Continue with current medication regimen on Metformin  and Ozempic . Refills sent today.   Sugars running 90 fasting.  Labs ordered today.  Return to clinic in 3 months for reevaluation.  Call sooner if concerns arise.       Relevant Orders   Hemoglobin A1c     Genitourinary   CKD (chronic kidney disease) stage 3, GFR 30-59 ml/min (HCC)   Chronic.  Stable.  On ACE and GLP1.  Continue with current medication regimen.  Labs ordered at visit today.  Will make recommendations based on lab results.         Follow up plan: Return in about 3 months (around 12/10/2023) for HTN, HLD, DM2 FU.

## 2023-09-09 NOTE — Assessment & Plan Note (Signed)
 Chronic. Will make recommendations based on lab results. Continue with current dose of Levothyroxine.  Refills sent today.  Follow up in 6 months.  Call sooner if concerns arise.

## 2023-09-09 NOTE — Assessment & Plan Note (Signed)
 Chronic.  Controlled.  Continue with current medication regimen on Pravastatin 40mg .  Refills sent today.  Labs ordered today.  Return to clinic in 6 months for reevaluation.  Call sooner if concerns arise.

## 2023-09-09 NOTE — Assessment & Plan Note (Signed)
 Chronic.  Stable.  On ACE and GLP1.  Continue with current medication regimen.  Labs ordered at visit today.  Will make recommendations based on lab results.

## 2023-09-09 NOTE — Assessment & Plan Note (Signed)
 Chronic.  Controlled at 7.7%.  Doing well with Ozempic .  Has lost over 30lbs since starting Ozempic .  Still on Actos  and Metformin .  Continue with current medication regimen on Metformin  and Ozempic . Refills sent today.   Sugars running 90 fasting.  Labs ordered today.  Return to clinic in 3 months for reevaluation.  Call sooner if concerns arise.

## 2023-09-09 NOTE — Assessment & Plan Note (Signed)
 Chronic.  Controlled.  Continue with current medication regimen of Lisinopril  10mg .  Refills sent today.  Labs ordered today.  Return to clinic in 3 months for reevaluation.  Call sooner if concerns arise.

## 2023-09-09 NOTE — Assessment & Plan Note (Addendum)
 Chronic.  Controlled at 7.7%.  Doing well with Ozempic .  Has lost over 30lbs since starting Ozempic .  Still on Actos  and Metformin .  Will likely need to decrease dose due to weight loss.  Continue with current medication regimen on Metformin  and Ozempic . Refills sent today.   Sugars running 90 fasting.  Labs ordered today.  Return to clinic in 3 months for reevaluation.  Call sooner if concerns arise.

## 2023-09-10 LAB — T4: T4, Total: 11.8 ug/dL (ref 4.5–12.0)

## 2023-09-10 LAB — COMPREHENSIVE METABOLIC PANEL WITH GFR
ALT: 6 IU/L (ref 0–32)
AST: 10 IU/L (ref 0–40)
Albumin: 4 g/dL (ref 3.8–4.8)
Alkaline Phosphatase: 57 IU/L (ref 44–121)
BUN/Creatinine Ratio: 16 (ref 12–28)
BUN: 18 mg/dL (ref 8–27)
Bilirubin Total: 0.3 mg/dL (ref 0.0–1.2)
CO2: 23 mmol/L (ref 20–29)
Calcium: 9.2 mg/dL (ref 8.7–10.3)
Chloride: 102 mmol/L (ref 96–106)
Creatinine, Ser: 1.1 mg/dL — ABNORMAL HIGH (ref 0.57–1.00)
Globulin, Total: 2.2 g/dL (ref 1.5–4.5)
Glucose: 112 mg/dL — ABNORMAL HIGH (ref 70–99)
Potassium: 4.8 mmol/L (ref 3.5–5.2)
Sodium: 139 mmol/L (ref 134–144)
Total Protein: 6.2 g/dL (ref 6.0–8.5)
eGFR: 52 mL/min/1.73 — ABNORMAL LOW (ref >59)

## 2023-09-10 LAB — LIPID PANEL
Chol/HDL Ratio: 2.4 ratio (ref 0.0–4.4)
Cholesterol, Total: 123 mg/dL (ref 100–199)
HDL: 51 mg/dL (ref >39)
LDL Chol Calc (NIH): 59 mg/dL (ref 0–99)
Triglycerides: 63 mg/dL (ref 0–149)
VLDL Cholesterol Cal: 13 mg/dL (ref 5–40)

## 2023-09-10 LAB — TSH: TSH: 1.1 u[IU]/mL (ref 0.450–4.500)

## 2023-09-10 LAB — HEMOGLOBIN A1C
Est. average glucose Bld gHb Est-mCnc: 148 mg/dL
Hgb A1c MFr Bld: 6.8 % — ABNORMAL HIGH (ref 4.8–5.6)

## 2023-09-13 ENCOUNTER — Ambulatory Visit: Payer: Self-pay | Admitting: Nurse Practitioner

## 2023-10-23 ENCOUNTER — Other Ambulatory Visit: Payer: Self-pay | Admitting: Nurse Practitioner

## 2023-10-25 ENCOUNTER — Other Ambulatory Visit: Payer: Self-pay

## 2023-10-25 MED ORDER — PRAVASTATIN SODIUM 40 MG PO TABS: 40.0000 mg | ORAL_TABLET | Freq: Every day | ORAL | 1 refills | Status: AC

## 2023-10-25 NOTE — Telephone Encounter (Signed)
 Requested Prescriptions  Pending Prescriptions Disp Refills   Semaglutide ,0.25 or 0.5MG /DOS, (OZEMPIC , 0.25 OR 0.5 MG/DOSE,) 2 MG/3ML SOPN [Pharmacy Med Name: OZEMPIC  2 MG/3ML Subcutaneous Solution Pen-injector] 2 mL 1    Sig: INJECT 0.5MG  UNDER THE SKIN ONE TIME WEEKLY     Endocrinology:  Diabetes - GLP-1 Receptor Agonists - semaglutide  Failed - 10/25/2023  3:23 PM      Failed - HBA1C in normal range and within 180 days    Hemoglobin A1C  Date Value Ref Range Status  11/24/2015 7.2%  Final   HB A1C (BAYER DCA - WAIVED)  Date Value Ref Range Status  12/26/2019 6.2 <7.0 % Final    Comment:                                          Diabetic Adult            <7.0                                       Healthy Adult        4.3 - 5.7                                                           (DCCT/NGSP) American Diabetes Association's Summary of Glycemic Recommendations for Adults with Diabetes: Hemoglobin A1c <7.0%. More stringent glycemic goals (A1c <6.0%) may further reduce complications at the cost of increased risk of hypoglycemia.    Hgb A1c MFr Bld  Date Value Ref Range Status  09/09/2023 6.8 (H) 4.8 - 5.6 % Final    Comment:             Prediabetes: 5.7 - 6.4          Diabetes: >6.4          Glycemic control for adults with diabetes: <7.0          Failed - Cr in normal range and within 360 days    Creatinine  Date Value Ref Range Status  02/23/2011 0.88 0.60 - 1.30 mg/dL Final   Creatinine, Ser  Date Value Ref Range Status  09/09/2023 1.10 (H) 0.57 - 1.00 mg/dL Final         Passed - Valid encounter within last 6 months    Recent Outpatient Visits           1 month ago Type 2 diabetes mellitus with proteinuria [facility])   [place] [facility] Melvin Pao, NP   4 months ago Type 2 diabetes mellitus with proteinuria [facility])   [place] [facility] Melvin Pao, NP

## 2023-10-30 ENCOUNTER — Other Ambulatory Visit: Payer: Self-pay | Admitting: Nurse Practitioner

## 2023-11-01 NOTE — Telephone Encounter (Signed)
 Requested Prescriptions  Refused Prescriptions Disp Refills   pravastatin  (PRAVACHOL ) 40 MG tablet [Pharmacy Med Name: pravastatin  40 mg tablet] 90 tablet 0    Sig: TAKE ONE TABLET BY MOUTH AT BEDTIME     Cardiovascular:  Antilipid - Statins Failed - 11/01/2023 12:21 PM      Failed - Lipid Panel in normal range within the last 12 months    Cholesterol, Total  Date Value Ref Range Status  09/09/2023 123 100 - 199 mg/dL Final   Cholesterol Piccolo, Waived  Date Value Ref Range Status  05/25/2016 121 <200 mg/dL Final    Comment:                            Desirable                <200                         Borderline High      200- 239                         High                     >239    LDL Chol Calc (NIH)  Date Value Ref Range Status  09/09/2023 59 0 - 99 mg/dL Final   HDL  Date Value Ref Range Status  09/09/2023 51 >39 mg/dL Final   Triglycerides  Date Value Ref Range Status  09/09/2023 63 0 - 149 mg/dL Final   Triglycerides Piccolo,Waived  Date Value Ref Range Status  05/25/2016 69 <150 mg/dL Final    Comment:                            Normal                   <150                         Borderline High     150 - 199                         High                200 - 499                         Very High                >499          Passed - Patient is not pregnant      Passed - Valid encounter within last 12 months    Recent Outpatient Visits           1 month ago Type 2 diabetes mellitus with proteinuria [facility])   [place] [facility] Melvin Pao, NP   4 months ago Type 2 diabetes mellitus with proteinuria [facility])   [place] [facility] Melvin Pao, NP

## 2023-11-02 ENCOUNTER — Other Ambulatory Visit: Payer: Self-pay | Admitting: Nurse Practitioner

## 2023-11-03 NOTE — Telephone Encounter (Signed)
 Requested Prescriptions  Refused Prescriptions Disp Refills   liraglutide  (VICTOZA ) 18 MG/3ML SOPN [Pharmacy Med Name: Victoza  3-Pak 0.6 mg/0.1 mL (18 mg/3 mL) subcutaneous pen injector] 9 mL 1    Sig: INJECT 1.8MG  SUBCUTANEOUSLY ONCE DAILY     Endocrinology:  Diabetes - GLP-1 Receptor Agonists Passed - 11/03/2023  5:25 PM      Passed - HBA1C is between 0 and 7.9 and within 180 days    Hemoglobin A1C  Date Value Ref Range Status  11/24/2015 7.2%  Final   HB A1C (BAYER DCA - WAIVED)  Date Value Ref Range Status  12/26/2019 6.2 <7.0 % Final    Comment:                                          Diabetic Adult            <7.0                                       Healthy Adult        4.3 - 5.7                                                           (DCCT/NGSP) American Diabetes Association's Summary of Glycemic Recommendations for Adults with Diabetes: Hemoglobin A1c <7.0%. More stringent glycemic goals (A1c <6.0%) may further reduce complications at the cost of increased risk of hypoglycemia.    Hgb A1c MFr Bld  Date Value Ref Range Status  09/09/2023 6.8 (H) 4.8 - 5.6 % Final    Comment:             Prediabetes: 5.7 - 6.4          Diabetes: >6.4          Glycemic control for adults with diabetes: <7.0          Passed - Valid encounter within last 6 months    Recent Outpatient Visits           1 month ago Type 2 diabetes mellitus with proteinuria [facility])   [place] [facility] Melvin Pao, NP   4 months ago Type 2 diabetes mellitus with proteinuria [facility])   [place] [facility] Melvin Pao, NP

## 2023-11-23 ENCOUNTER — Other Ambulatory Visit: Payer: Self-pay | Admitting: Nurse Practitioner

## 2023-11-24 NOTE — Telephone Encounter (Signed)
 Requested Prescriptions  Pending Prescriptions Disp Refills   levothyroxine  (SYNTHROID ) 75 MCG tablet [Pharmacy Med Name: LEVOTHYROXINE  SODIUM 75 MCG Oral Tablet] 90 tablet 0    Sig: TAKE 1 TABLET ONE TIME DAILY BEFORE BREAKFAST     Endocrinology:  Hypothyroid Agents Passed - 11/24/2023  4:27 PM      Passed - TSH in normal range and within 360 days    TSH  Date Value Ref Range Status  09/09/2023 1.100 0.450 - 4.500 uIU/mL Final         Passed - Valid encounter within last 12 months    Recent Outpatient Visits           2 months ago Type 2 diabetes mellitus with proteinuria [facility])   [place] [facility] Melvin Pao, NP   5 months ago Type 2 diabetes mellitus with proteinuria [facility])   [place] [facility] Melvin Pao, NP               lisinopril  (ZESTRIL ) 10 MG tablet [Pharmacy Med Name: LISINOPRIL  10 MG Oral Tablet] 90 tablet 0    Sig: TAKE 1 TABLET (10 MG TOTAL) BY MOUTH DAILY.     Cardiovascular:  ACE Inhibitors Failed - 11/24/2023  4:27 PM      Failed - Cr in normal range and within 180 days    Creatinine  Date Value Ref Range Status  02/23/2011 0.88 0.60 - 1.30 mg/dL Final   Creatinine, Ser  Date Value Ref Range Status  09/09/2023 1.10 (H) 0.57 - 1.00 mg/dL Final         Passed - K in normal range and within 180 days    Potassium  Date Value Ref Range Status  09/09/2023 4.8 3.5 - 5.2 mmol/L Final         Passed - Patient is not pregnant      Passed - Last BP in normal range    BP Readings from Last 1 Encounters:  09/09/23 112/62         Passed - Valid encounter within last 6 months    Recent Outpatient Visits           2 months ago Type 2 diabetes mellitus with proteinuria (HCC)   [place] [facility] Melvin Pao, NP   5 months ago Type 2 diabetes mellitus with proteinuria [facility])   [place] [facility] Melvin Pao, NP               metFORMIN  (GLUCOPHAGE -XR)  500 MG 24 hr tablet [Pharmacy Med Name: METFORMIN  HYDROCHLORIDE ER 500 MG Oral Tablet Extended Release 24 Hour] 360 tablet 0    Sig: TAKE 2 TABLETS IN THE MORNING AND TAKE 2 TABLETS AT BEDTIME     Endocrinology:  Diabetes - Biguanides Failed - 11/24/2023  4:27 PM      Failed - Cr in normal range and within 360 days    Creatinine  Date Value Ref Range Status  02/23/2011 0.88 0.60 - 1.30 mg/dL Final   Creatinine, Ser  Date Value Ref Range Status  09/09/2023 1.10 (H) 0.57 - 1.00 mg/dL Final         Failed - eGFR in normal range and within 360 days    EGFR (African American)  Date Value Ref Range Status  02/23/2011 >60 >53mL/min Final   GFR calc Af Amer  Date Value Ref Range Status  12/26/2019 64 >59 mL/min/1.73 Final    Comment:    **In accordance  with recommendations from the NKF-ASN Task force,**   [facility] is in the process of updating its eGFR calculation to the   2021 CKD-EPI creatinine equation that estimates kidney function   without a race variable.    EGFR (Non-African Amer.)  Date Value Ref Range Status  02/23/2011 >60 >89mL/min Final    Comment:    eGFR values <56mL/min/1.73 m2 may be an indication of chronic kidney disease (CKD). Calculated eGFR, using the MRDR Study equation, is useful in  patients with stable renal function. The eGFR calculation will not be reliable in acutely ill patients when serum creatinine is changing rapidly. It is not useful in patients on dialysis. The eGFR calculation may not be applicable to patients at the low and high extremes of body sizes, pregnant women, and vegetarians.    GFR calc non Af Amer  Date Value Ref Range Status  12/26/2019 55 (L) >59 mL/min/1.73 Final   eGFR  Date Value Ref Range Status  09/09/2023 52 (L) >59 mL/min/1.73 Final         Failed - B12 Level in normal range and within 720 days    Vitamin B-12  Date Value Ref Range Status  06/08/2023 1,441 (H) 232 - 1,245 pg/mL Final         Failed - CBC  within normal limits and completed in the last 12 months    WBC  Date Value Ref Range Status  11/05/2022 5.1 3.4 - 10.8 x10E3/uL Final  02/23/2011 5.2 3.6 - 11.0 x10 3/mm  Final   RBC  Date Value Ref Range Status  11/05/2022 3.18 (L) 3.77 - 5.28 x10E6/uL Final    Comment:    Ovalocytes present. CBC results reported were obtained after the specimen had been warmed to 37 degrees C.  This may indicate the presence of Cold Agglutinins.   02/23/2011 3.52 (L) 3.80 - 5.20 x10 6/mm  Final   Hemoglobin  Date Value Ref Range Status  11/05/2022 10.7 (L) 11.1 - 15.9 g/dL Final   Hematocrit  Date Value Ref Range Status  11/05/2022 33.2 (L) 34.0 - 46.6 % Final   MCHC  Date Value Ref Range Status  11/05/2022 32.2 31.5 - 35.7 g/dL Final  97/87/7986 65.3 32.0 - 36.0 g/dL Final   [facility]  Date Value Ref Range Status  11/05/2022 33.6 (H) 26.6 - 33.0 pg Final  02/23/2011 34.6 (H) 26.0 - 34.0 pg Final   MCV  Date Value Ref Range Status  11/05/2022 104 (H) 79 - 97 fL Final  02/23/2011 100 80 - 100 fL Final   No results found for: PLTCOUNTKUC, LABPLAT, POCPLA RDW  Date Value Ref Range Status  11/05/2022 12.8 11.7 - 15.4 % Final  02/23/2011 13.8 11.5 - 14.5 % Final         Passed - HBA1C is between 0 and 7.9 and within 180 days    Hemoglobin A1C  Date Value Ref Range Status  11/24/2015 7.2%  Final   HB A1C (BAYER DCA - WAIVED)  Date Value Ref Range Status  12/26/2019 6.2 <7.0 % Final    Comment:                                          Diabetic Adult            <7.0  Healthy Adult        4.3 - 5.7                                                           (DCCT/NGSP) American Diabetes Association's Summary of Glycemic Recommendations for Adults with Diabetes: Hemoglobin A1c <7.0%. More stringent glycemic goals (A1c <6.0%) may further reduce complications at the cost of increased risk of hypoglycemia.    Hgb A1c MFr Bld  Date Value Ref  Range Status  09/09/2023 6.8 (H) 4.8 - 5.6 % Final    Comment:             Prediabetes: 5.7 - 6.4          Diabetes: >6.4          Glycemic control for adults with diabetes: <7.0          Passed - Valid encounter within last 6 months    Recent Outpatient Visits           2 months ago Type 2 diabetes mellitus with proteinuria [facility])   [place] [facility] Melvin Pao, NP   5 months ago Type 2 diabetes mellitus with proteinuria [facility])   [place] [facility] Melvin Pao, NP

## 2023-12-14 ENCOUNTER — Encounter: Payer: Self-pay | Admitting: Nurse Practitioner

## 2023-12-14 ENCOUNTER — Other Ambulatory Visit: Payer: Self-pay | Admitting: Nurse Practitioner

## 2023-12-14 ENCOUNTER — Ambulatory Visit: Admitting: Nurse Practitioner

## 2023-12-14 VITALS — BP 110/62 | HR 65 | Temp 97.6°F | Ht 62.09 in | Wt 122.6 lb

## 2023-12-14 DIAGNOSIS — E039 Hypothyroidism, unspecified: Secondary | ICD-10-CM

## 2023-12-14 DIAGNOSIS — E1159 Type 2 diabetes mellitus with other circulatory complications: Secondary | ICD-10-CM

## 2023-12-14 DIAGNOSIS — I152 Hypertension secondary to endocrine disorders: Secondary | ICD-10-CM | POA: Diagnosis not present

## 2023-12-14 DIAGNOSIS — E1129 Type 2 diabetes mellitus with other diabetic kidney complication: Secondary | ICD-10-CM

## 2023-12-14 DIAGNOSIS — E785 Hyperlipidemia, unspecified: Secondary | ICD-10-CM | POA: Diagnosis not present

## 2023-12-14 DIAGNOSIS — E1169 Type 2 diabetes mellitus with other specified complication: Secondary | ICD-10-CM

## 2023-12-14 DIAGNOSIS — N1831 Chronic kidney disease, stage 3a: Secondary | ICD-10-CM

## 2023-12-14 DIAGNOSIS — Z1231 Encounter for screening mammogram for malignant neoplasm of breast: Secondary | ICD-10-CM

## 2023-12-14 DIAGNOSIS — Z23 Encounter for immunization: Secondary | ICD-10-CM

## 2023-12-14 DIAGNOSIS — Z7985 Long-term (current) use of injectable non-insulin antidiabetic drugs: Secondary | ICD-10-CM

## 2023-12-14 DIAGNOSIS — L299 Pruritus, unspecified: Secondary | ICD-10-CM

## 2023-12-14 NOTE — Assessment & Plan Note (Signed)
 Chronic.  Stable.  On ACE and GLP1.  Continue with current medication regimen.  Labs ordered at visit today.  Will make recommendations based on lab results.

## 2023-12-14 NOTE — Assessment & Plan Note (Signed)
 Chronic.  Controlled at 6.9%.  Doing well with Ozempic .  Has lost over 30lbs since starting Ozempic .  Weight has held steady since last visit.  Still on Actos  but dose was decreased at last visit and Metformin .  Continue with current medication regimen of medications.  Refills sent today.   Sugars running 90 fasting.  Labs ordered today.  Return to clinic in 6 months for reevaluation.  Call sooner if concerns arise.

## 2023-12-14 NOTE — Progress Notes (Signed)
 BP 110/62 (BP Location: Left Arm, Patient Position: Sitting, Cuff Size: Normal)   Pulse 65   Temp 97.6 F (36.4 C) (Oral)   Ht 5' 2.09 (1.577 m)   Wt 122 lb 9.6 oz (55.6 kg)   LMP  (LMP Unknown)   SpO2 96%   BMI 22.36 kg/m    Subjective:    Patient ID: Emma Berry, female    DOB: 12/08/1950, 73 y.o.   MRN: [id number]  HPI: Emma Berry is a 73 y.o. female  Chief Complaint  Patient presents with   Hypertension   Hyperlipidemia    3 month F/u   HYPERTENSION / HYPERLIPIDEMIA Satisfied with current treatment? yes Duration of hypertension: years BP monitoring frequency: weekly BP range: 110-120/60 BP medication side effects: no Past BP meds: quinapril  Duration of hyperlipidemia: years Cholesterol medication side effects: no Cholesterol supplements: none Past cholesterol medications: pravastatin  (pravachol ) Medication compliance: excellent compliance Aspirin: yes Recent stressors: no Recurrent headaches: no Visual changes: no Palpitations: no Dyspnea: no Chest pain: no Lower extremity edema: no Dizzy/lightheaded: no  HYPOTHYROIDISM Thyroid  control status:controlled Satisfied with current treatment? yes Medication side effects: no Medication compliance: excellent compliance Etiology of hypothyroidism:  Recent dose adjustment:no Fatigue: no Cold intolerance: no Heat intolerance: no Weight gain: no Weight loss: no Constipation: no Diarrhea/loose stools: no Palpitations: no Lower extremity edema: no Anxiety/depressed mood: no  CHRONIC KIDNEY DISEASE CKD status: controlled Medications renally dose: no Previous renal evaluation: no Pneumovax:  Up to Date Influenza Vaccine:  Up to Date  DIABETES She is taking Ozempic  0.25mg .  She is doing well have has seen her appetite decrease.  Her weight is holding strong at 125lb.  She also cut back on her Actos  to 15mg  after last visit. Hypoglycemic episodes:no symptoms Polydipsia/polyuria: no Visual  disturbance: no Chest pain: no Paresthesias: no Glucose Monitoring: no  Accucheck frequency: Daily  Fasting glucose: 90  Post prandial:  Evening:  Before meals: Taking Insulin ?: no  Long acting insulin :  Short acting insulin : Blood Pressure Monitoring: weekly Retinal Examination: Up to Date Foot Exam: Completed this visit Diabetic Education: Not Completed Pneumovax: Up to Date Influenza:Completed this visit Aspirin: yes     Relevant past medical, surgical, family and social history reviewed and updated as indicated. Interim medical history since our last visit reviewed. Allergies and medications reviewed and updated.  Review of Systems  Constitutional:  Negative for activity change, appetite change, chills and fever.  Eyes:  Negative for visual disturbance.  Respiratory:  Negative for cough, chest tightness and shortness of breath.   Cardiovascular:  Negative for chest pain, palpitations and leg swelling.  Gastrointestinal:  Negative for constipation and diarrhea.  Endocrine: Negative for polydipsia and polyuria.  Neurological:  Negative for dizziness, light-headedness, numbness and headaches.  Psychiatric/Behavioral:  Negative for confusion, sleep disturbance and suicidal ideas.     Per HPI unless specifically indicated above     Objective:    BP 110/62 (BP Location: Left Arm, Patient Position: Sitting, Cuff Size: Normal)   Pulse 65   Temp 97.6 F (36.4 C) (Oral)   Ht 5' 2.09 (1.577 m)   Wt 122 lb 9.6 oz (55.6 kg)   LMP  (LMP Unknown)   SpO2 96%   BMI 22.36 kg/m   Wt Readings from Last 3 Encounters:  12/14/23 122 lb 9.6 oz (55.6 kg)  09/09/23 123 lb 12.8 oz (56.2 kg)  06/08/23 127 lb 6.4 oz (57.8 kg)    Physical Exam Vitals and nursing  note reviewed.  Constitutional:      General: She is not in acute distress.    Appearance: Normal appearance. She is normal weight. She is not ill-appearing, toxic-appearing or diaphoretic.  HENT:     Head:  Normocephalic.     Right Ear: External ear normal.     Left Ear: External ear normal.     Nose: Nose normal.     Mouth/Throat:     Mouth: Mucous membranes are moist.     Pharynx: Oropharynx is clear.  Eyes:     General:        Right eye: No discharge.        Left eye: No discharge.     Extraocular Movements: Extraocular movements intact.     Conjunctiva/sclera: Conjunctivae normal.     Pupils: Pupils are equal, round, and reactive to light.  Cardiovascular:     Rate and Rhythm: Normal rate and regular rhythm.     Heart sounds: No murmur heard. Pulmonary:     Effort: Pulmonary effort is normal. No respiratory distress.     Breath sounds: Normal breath sounds. No wheezing or rales.  Musculoskeletal:     Cervical back: Normal range of motion and neck supple.  Skin:    General: Skin is warm and dry.     Capillary Refill: Capillary refill takes less than 2 seconds.  Neurological:     General: No focal deficit present.     Mental Status: She is alert and oriented to person, place, and time. Mental status is at baseline.  Psychiatric:        Mood and Affect: Mood normal.        Behavior: Behavior normal.        Thought Content: Thought content normal.        Judgment: Judgment normal.     Results for orders placed or performed in visit on 09/09/23  Comprehensive metabolic panel with GFR   Collection Time: 09/09/23  8:59 AM  Result Value Ref Range   Glucose 112 (H) 70 - 99 mg/dL   BUN 18 8 - 27 mg/dL   Creatinine, Ser 8.89 (H) 0.57 - 1.00 mg/dL   eGFR 52 (L) >40 fO/fpw/8.26   BUN/Creatinine Ratio 16 12 - 28   Sodium 139 134 - 144 mmol/L   Potassium 4.8 3.5 - 5.2 mmol/L   Chloride 102 96 - 106 mmol/L   CO2 23 20 - 29 mmol/L   Calcium 9.2 8.7 - 10.3 mg/dL   Total Protein 6.2 6.0 - 8.5 g/dL   Albumin 4.0 3.8 - 4.8 g/dL   Globulin, Total 2.2 1.5 - 4.5 g/dL   Bilirubin Total 0.3 0.0 - 1.2 mg/dL   Alkaline Phosphatase 57 44 - 121 IU/L   AST 10 0 - 40 IU/L   ALT 6 0 - 32 IU/L   Hemoglobin A1c   Collection Time: 09/09/23  8:59 AM  Result Value Ref Range   Hgb A1c MFr Bld 6.8 (H) 4.8 - 5.6 %   Est. average glucose Bld gHb Est-mCnc 148 mg/dL  Lipid panel   Collection Time: 09/09/23  8:59 AM  Result Value Ref Range   Cholesterol, Total 123 100 - 199 mg/dL   Triglycerides 63 0 - 149 mg/dL   HDL 51 >60 mg/dL   VLDL Cholesterol Cal 13 5 - 40 mg/dL   LDL Chol Calc (NIH) 59 0 - 99 mg/dL   Chol/HDL Ratio 2.4 0.0 - 4.4 ratio  TSH  Collection Time: 09/09/23  8:59 AM  Result Value Ref Range   TSH 1.100 0.450 - 4.500 uIU/mL  T4   Collection Time: 09/09/23  8:59 AM  Result Value Ref Range   T4, Total 11.8 4.5 - 12.0 ug/dL      Assessment & Plan:   Problem List Items Addressed This Visit       Cardiovascular and Mediastinum   Hypertension associated with diabetes (HCC) - Primary   Chronic.  Controlled.  Continue with current medication regimen of Lisinopril  10mg .  Refills sent today.  Labs ordered today.  Return to clinic in 6 months for reevaluation.  Call sooner if concerns arise.       Relevant Orders   Comprehensive metabolic panel with GFR     Endocrine   Type 2 diabetes mellitus with proteinuria (HCC)   Chronic.  Controlled at 6.9%.  Doing well with Ozempic .  Has lost over 30lbs since starting Ozempic .  Weight has held steady since last visit.  Still on Actos  but dose was decreased at last visit and Metformin .  Continue with current medication regimen of medications.  Refills sent today.   Sugars running 90 fasting.  Labs ordered today.  Return to clinic in 6 months for reevaluation.  Call sooner if concerns arise.       Relevant Orders   Hemoglobin A1c   Hyperlipidemia associated with type 2 diabetes mellitus (HCC)   Chronic.  Controlled.  Continue with current medication regimen on Pravastatin  40mg .  Refills sent today.  Labs ordered today.  Return to clinic in 6 months for reevaluation.  Call sooner if concerns arise.       Relevant Orders    Lipid panel   Hypothyroidism   Chronic. Will make recommendations based on lab results. Continue with current dose of Levothyroxine .  No recent dose changes.  Refills sent today.  Follow up in 6 months.  Call sooner if concerns arise.       Relevant Orders   T4   TSH   Diabetes mellitus treated with injections of non-insulin  medication (HCC)   Chronic.  Controlled at 6.9%.  Doing well with Ozempic .  Has lost over 30lbs since starting Ozempic .  Weight has held steady since last visit.  Still on Actos  but dose was decreased at last visit and Metformin .  Continue with current medication regimen of medications.  Refills sent today.   Sugars running 90 fasting.  Labs ordered today.  Return to clinic in 6 months for reevaluation.  Call sooner if concerns arise.         Genitourinary   CKD (chronic kidney disease) stage 3, GFR 30-59 ml/min (HCC)   Chronic.  Stable.  On ACE and GLP1.  Continue with current medication regimen.  Labs ordered at visit today.  Will make recommendations based on lab results.         Other   Encounter for screening mammogram for malignant neoplasm of breast   Relevant Orders   MM 3D SCREENING MAMMOGRAM BILATERAL BREAST   Other Visit Diagnoses       Flu vaccine need       Relevant Orders   Flu vaccine HIGH DOSE PF(Fluzone Trivalent) (Completed)     Itchy skin       Relevant Orders   Ambulatory referral to Dermatology         Follow up plan: Return in about 6 months (around 06/13/2024) for Physical and Fasting labs.

## 2023-12-14 NOTE — Assessment & Plan Note (Signed)
 Chronic. Will make recommendations based on lab results. Continue with current dose of Levothyroxine .  No recent dose changes.  Refills sent today.  Follow up in 6 months.  Call sooner if concerns arise.

## 2023-12-14 NOTE — Patient Instructions (Signed)
 You have an order for:  []   2D Mammogram  [x]   3D Mammogram  []   Bone Density     Please call for appointment:  [facility]  [address] [place] [address] [phone] [facility] [address] [place], [place] [address] [phone] [facility] [address] [place], [place] [address] [phone]   Make sure to wear two-piece clothing.  No lotions, powders, or deodorants the day of the appointment. Make sure to bring picture ID and insurance card.  Bring list of medications you are currently taking including any supplements.   Schedule your [place] screening mammogram through MyChart!   Log into your MyChart account.  Go to 'Visit' (or 'Appointments' if on mobile App) --> Schedule an Appointment  Under 'Select a Reason for Visit' choose the Mammogram Screening option.  Complete the pre-visit questions and select the time and place that best fits your schedule.

## 2023-12-14 NOTE — Assessment & Plan Note (Signed)
 Chronic.  Controlled.  Continue with current medication regimen on Pravastatin 40mg .  Refills sent today.  Labs ordered today.  Return to clinic in 6 months for reevaluation.  Call sooner if concerns arise.

## 2023-12-14 NOTE — Assessment & Plan Note (Addendum)
 Chronic.  Controlled.  Continue with current medication regimen of Lisinopril 10mg .  Refills sent today.  Labs ordered today.  Return to clinic in 6 months for reevaluation.  Call sooner if concerns arise.

## 2023-12-15 ENCOUNTER — Ambulatory Visit: Payer: Self-pay | Admitting: Nurse Practitioner

## 2023-12-15 LAB — HEMOGLOBIN A1C
Est. average glucose Bld gHb Est-mCnc: 151 mg/dL
Hgb A1c MFr Bld: 6.9 % — ABNORMAL HIGH (ref 4.8–5.6)

## 2023-12-15 LAB — TSH: TSH: 1.18 u[IU]/mL (ref 0.450–4.500)

## 2023-12-15 LAB — COMPREHENSIVE METABOLIC PANEL WITH GFR
ALT: 7 IU/L (ref 0–32)
AST: 13 IU/L (ref 0–40)
Albumin: 4.1 g/dL (ref 3.8–4.8)
Alkaline Phosphatase: 64 IU/L (ref 49–135)
BUN/Creatinine Ratio: 19 (ref 12–28)
BUN: 20 mg/dL (ref 8–27)
Bilirubin Total: 0.2 mg/dL (ref 0.0–1.2)
CO2: 22 mmol/L (ref 20–29)
Calcium: 9.4 mg/dL (ref 8.7–10.3)
Chloride: 101 mmol/L (ref 96–106)
Creatinine, Ser: 1.07 mg/dL — ABNORMAL HIGH (ref 0.57–1.00)
Globulin, Total: 2.3 g/dL (ref 1.5–4.5)
Glucose: 109 mg/dL — ABNORMAL HIGH (ref 70–99)
Potassium: 5.3 mmol/L — ABNORMAL HIGH (ref 3.5–5.2)
Sodium: 137 mmol/L (ref 134–144)
Total Protein: 6.4 g/dL (ref 6.0–8.5)
eGFR: 54 mL/min/1.73 — ABNORMAL LOW (ref >59)

## 2023-12-15 LAB — LIPID PANEL
Chol/HDL Ratio: 2 ratio (ref 0.0–4.4)
Cholesterol, Total: 125 mg/dL (ref 100–199)
HDL: 63 mg/dL (ref >39)
LDL Chol Calc (NIH): 50 mg/dL (ref 0–99)
Triglycerides: 53 mg/dL (ref 0–149)
VLDL Cholesterol Cal: 12 mg/dL (ref 5–40)

## 2023-12-15 LAB — T4: T4, Total: 11.4 ug/dL (ref 4.5–12.0)

## 2023-12-16 NOTE — Telephone Encounter (Signed)
 Requested Prescriptions  Pending Prescriptions Disp Refills   Semaglutide ,0.25 or 0.5MG /DOS, (OZEMPIC , 0.25 OR 0.5 MG/DOSE,) 2 MG/3ML SOPN [Pharmacy Med Name: OZEMPIC  2 MG/3ML Subcutaneous Solution Pen-injector] 3 mL 5    Sig: INJECT 0.5MG  UNDER THE SKIN ONE TIME WEEKLY     Endocrinology:  Diabetes - GLP-1 Receptor Agonists - semaglutide  Failed - 12/16/2023  1:42 PM      Failed - HBA1C in normal range and within 180 days    Hemoglobin A1C  Date Value Ref Range Status  11/24/2015 7.2%  Final   HB A1C (BAYER DCA - WAIVED)  Date Value Ref Range Status  12/26/2019 6.2 <7.0 % Final    Comment:                                          Diabetic Adult            <7.0                                       Healthy Adult        4.3 - 5.7                                                           (DCCT/NGSP) American Diabetes Association's Summary of Glycemic Recommendations for Adults with Diabetes: Hemoglobin A1c <7.0%. More stringent glycemic goals (A1c <6.0%) may further reduce complications at the cost of increased risk of hypoglycemia.    Hgb A1c MFr Bld  Date Value Ref Range Status  12/14/2023 6.9 (H) 4.8 - 5.6 % Final    Comment:             Prediabetes: 5.7 - 6.4          Diabetes: >6.4          Glycemic control for adults with diabetes: <7.0          Failed - Cr in normal range and within 360 days    Creatinine  Date Value Ref Range Status  02/23/2011 0.88 0.60 - 1.30 mg/dL Final   Creatinine, Ser  Date Value Ref Range Status  12/14/2023 1.07 (H) 0.57 - 1.00 mg/dL Final         Passed - Valid encounter within last 6 months    Recent Outpatient Visits           2 days ago Hypertension associated with diabetes [facility])   [place] [facility] Melvin Pao, NP   3 months ago Type 2 diabetes mellitus with proteinuria [facility])   [place] [facility] Melvin Pao, NP   6 months ago Type 2 diabetes mellitus with proteinuria [facility])   [facility] Melvin Pao, NP       Future Appointments             In 1 week Raymund, Lauraine BROCKS, MD [facility]

## 2023-12-22 ENCOUNTER — Ambulatory Visit: Admission: RE | Admit: 2023-12-22 | Discharge: 2023-12-22 | Attending: Nurse Practitioner

## 2023-12-22 DIAGNOSIS — Z1231 Encounter for screening mammogram for malignant neoplasm of breast: Secondary | ICD-10-CM | POA: Diagnosis present

## 2023-12-28 ENCOUNTER — Ambulatory Visit

## 2023-12-28 DIAGNOSIS — L814 Other melanin hyperpigmentation: Secondary | ICD-10-CM | POA: Diagnosis not present

## 2023-12-28 DIAGNOSIS — L578 Other skin changes due to chronic exposure to nonionizing radiation: Secondary | ICD-10-CM

## 2023-12-28 DIAGNOSIS — C4491 Basal cell carcinoma of skin, unspecified: Secondary | ICD-10-CM

## 2023-12-28 DIAGNOSIS — D492 Neoplasm of unspecified behavior of bone, soft tissue, and skin: Secondary | ICD-10-CM

## 2023-12-28 DIAGNOSIS — C44519 Basal cell carcinoma of skin of other part of trunk: Secondary | ICD-10-CM | POA: Diagnosis not present

## 2023-12-28 DIAGNOSIS — Z808 Family history of malignant neoplasm of other organs or systems: Secondary | ICD-10-CM | POA: Diagnosis not present

## 2023-12-28 DIAGNOSIS — D225 Melanocytic nevi of trunk: Secondary | ICD-10-CM

## 2023-12-28 DIAGNOSIS — W908XXA Exposure to other nonionizing radiation, initial encounter: Secondary | ICD-10-CM

## 2023-12-28 DIAGNOSIS — L821 Other seborrheic keratosis: Secondary | ICD-10-CM

## 2023-12-28 HISTORY — DX: Basal cell carcinoma of skin, unspecified: C44.91

## 2023-12-28 NOTE — Patient Instructions (Addendum)

## 2023-12-28 NOTE — Progress Notes (Signed)
 Subjective   Emma Berry is a 73 y.o. female who presents for the following: itching on back. Patient is new patient  New patient referral from Darice Petty, NP.  Today patient reports: Itching on back ~3-4 wks, no treatment  Review of Systems:    No other skin or systemic complaints except as noted in HPI or Assessment and Plan.  The following portions of the chart were reviewed this encounter and updated as appropriate: medications, allergies, medical history  Relevant Medical History:  Family history of skin cancer - mother with hx of melanoma   Objective  (SKPE) Well appearing patient in no apparent distress; mood and affect are within normal limits. Examination was performed of the: Focused Exam of: back   Examination notable for: Lentigo/lentigines: Scattered pigmented macules that are tan to brown in color and are somewhat non-uniform in shape and concentrated in the sun-exposed areas, Nevus/nevi: Scattered well-demarcated, regular, pigmented macule(s) and/or papule(s)  , Seborrheic Keratosis(es): Stuck-on appearing keratotic papule(s) on the trunk, some  irritated with redness, crusting, edema, and/or partial avulsion  Examination limited by: Undergarments   R mid back medial 3.0 x 2.5cm ulcerated pigmented plaque  R mid back lat 1.5cm pink brown plaque    Assessment & Plan  (SKAP)    BENIGN SKIN FINDINGS  - Lentigines  - Seborrheic keratoses             - Nevus/Multiple Benign Nevi - Reassurance provided regarding the benign appearance of lesions noted on exam today; no treatment is indicated in the absence of symptoms/changes. - Reinforced importance of photoprotective strategies including liberal and frequent sunscreen use of a broad-spectrum SPF 30 or greater, use of protective clothing, and sun avoidance for prevention of cutaneous malignancy and photoaging.  Counseled patient on the importance of regular self-skin monitoring as well as routine clinical  skin examinations as scheduled.   ACTINIC DAMAGE - Chronic condition, secondary to cumulative UV/sun exposure - Recommend daily broad spectrum sunscreen SPF 30+ to sun-exposed areas, reapply every 2 hours as needed.  - Staying in the shade or wearing long sleeves, sun glasses (UVA+UVB protection) and wide brim hats (4-inch brim around the entire circumference of the hat) are also recommended for sun protection.  - Call for new or changing lesions.  FAMILY HISTORY OF SKIN CANCER What type(s): melanoma Who affected: mother    Was sun protection counseling provided?: Yes   Level of service outlined above   Patient instructions (SKPI)   Procedures, orders, diagnosis for this visit:  NEOPLASM OF SKIN (2) R mid back medial - Skin / nail biopsy Type of biopsy: tangential   Informed consent: discussed and consent obtained   Anesthesia: the lesion was anesthetized in a standard fashion   Anesthesia comment:  Area prepped with alcohol Anesthetic:  1% lidocaine  w/ epinephrine  1-100,000 buffered w/ 8.4% NaHCO3 Instrument used: flexible razor blade   Hemostasis achieved with: pressure, aluminum chloride and electrodesiccation   Outcome: patient tolerated procedure well   Post-procedure details: wound care instructions given   Post-procedure details comment:  Ointment and small bandage applied  Specimen 1 - Surgical pathology Differential Diagnosis: Pigmented BCC vs Melanoma  Check Margins: No 3.0 x 2.5cm ulcerated pigmented plaque R mid back lat - Skin / nail biopsy Type of biopsy: tangential   Informed consent: discussed and consent obtained   Anesthesia: the lesion was anesthetized in a standard fashion   Anesthesia comment:  Area prepped with alcohol Anesthetic:  1% lidocaine   w/ epinephrine  1-100,000 buffered w/ 8.4% NaHCO3 Instrument used: flexible razor blade   Hemostasis achieved with: pressure, aluminum chloride and electrodesiccation   Outcome: patient tolerated procedure  well   Post-procedure details: wound care instructions given   Post-procedure details comment:  Ointment and small bandage applied  Specimen 2 - Surgical pathology Differential Diagnosis: Nevus vs Melanoma  Check Margins: No 1.5cm pink brown plaque   Neoplasm of skin -     Skin / nail biopsy -     Skin / nail biopsy -     Surgical pathology; Standing    Return to clinic: Return in about 6 months (around 06/27/2024) for TBSE.  I, Grayce Saunas, RMA, am acting as scribe for Lauraine JAYSON Kanaris, MD .   Documentation: I have reviewed the above documentation for accuracy and completeness, and I agree with the above.  Lauraine JAYSON Kanaris, MD

## 2023-12-30 LAB — SURGICAL PATHOLOGY

## 2024-01-06 ENCOUNTER — Other Ambulatory Visit: Payer: Self-pay | Admitting: Nurse Practitioner

## 2024-01-06 DIAGNOSIS — E1129 Type 2 diabetes mellitus with other diabetic kidney complication: Secondary | ICD-10-CM

## 2024-01-08 ENCOUNTER — Ambulatory Visit: Payer: Self-pay

## 2024-01-08 DIAGNOSIS — C44519 Basal cell carcinoma of skin of other part of trunk: Secondary | ICD-10-CM

## 2024-01-09 NOTE — Telephone Encounter (Addendum)
 Called and discussed results with patient. She verbalized understanding and would like referral sent Dr Bluford.  Referral sent to Dr. Bluford  Will recheck areas at next follow up  ----- Message from Lauraine Kanaris, MD sent at 01/08/2024  6:58 PM EST ----- Please notify patient with below plan:  1. Skin, R mid back medial :       BASAL CELL CARCINOMA, SUPERFICIAL AND NODULAR PATTERNS   MOHS        2. 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

## 2024-01-09 NOTE — Telephone Encounter (Signed)
-----   Message from Lauraine Kanaris, MD sent at 01/08/2024  6:58 PM EST ----- Please notify patient with below plan:  1. Skin, R mid back medial :       BASAL CELL CARCINOMA, SUPERFICIAL AND NODULAR PATTERNS   MOHS        2. 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

## 2024-01-09 NOTE — Telephone Encounter (Signed)
 Unable to leave voicemail since mailbox full.

## 2024-01-09 NOTE — Addendum Note (Signed)
 Addended by: TANDA SETTER A on: 01/09/2024 04:31 PM   Modules accepted: Orders

## 2024-01-17 ENCOUNTER — Telehealth: Payer: Self-pay | Admitting: Nurse Practitioner

## 2024-01-17 NOTE — Telephone Encounter (Signed)
 I called and the patient's husband answered, he stated she wasn't available at the moment so he is going to tell her to call back later. If she does it is ok for E2C2 to assist her with changing her pharmacy.

## 2024-01-17 NOTE — Telephone Encounter (Unsigned)
 Copied from CRM [id number]. Topic: General - Other >> Jan 17, 2024 10:38 AM Tiffini S wrote: Reason for CRM: Patient have new insurance with [facility]-  she will use [facility] and will no longer use [facility]  Please call the patient back at [phone]

## 2024-01-18 NOTE — Telephone Encounter (Signed)
 Pharmacy is updated at this time.

## 2024-02-10 ENCOUNTER — Other Ambulatory Visit: Payer: Self-pay | Admitting: Nurse Practitioner

## 2024-05-01 ENCOUNTER — Ambulatory Visit

## 2024-06-13 ENCOUNTER — Encounter: Admitting: Nurse Practitioner

## 2024-06-27 ENCOUNTER — Ambulatory Visit
